# Patient Record
Sex: Female | Born: 1975 | Race: White | Hispanic: No | Marital: Married | Smoking: Never smoker
Health system: Southern US, Community
[De-identification: ages and names within clinical notes are randomized; demographics above are authoritative.]

## PROBLEM LIST (undated history)

## (undated) DIAGNOSIS — M1990 Unspecified osteoarthritis, unspecified site: Secondary | ICD-10-CM

## (undated) DIAGNOSIS — R238 Other skin changes: Secondary | ICD-10-CM

## (undated) DIAGNOSIS — G43909 Migraine, unspecified, not intractable, without status migrainosus: Secondary | ICD-10-CM

## (undated) DIAGNOSIS — R233 Spontaneous ecchymoses: Secondary | ICD-10-CM

## (undated) DIAGNOSIS — G44009 Cluster headache syndrome, unspecified, not intractable: Secondary | ICD-10-CM

## (undated) DIAGNOSIS — E785 Hyperlipidemia, unspecified: Secondary | ICD-10-CM

## (undated) DIAGNOSIS — M545 Low back pain, unspecified: Secondary | ICD-10-CM

## (undated) DIAGNOSIS — I10 Essential (primary) hypertension: Secondary | ICD-10-CM

## (undated) DIAGNOSIS — J029 Acute pharyngitis, unspecified: Secondary | ICD-10-CM

## (undated) DIAGNOSIS — K219 Gastro-esophageal reflux disease without esophagitis: Secondary | ICD-10-CM

## (undated) DIAGNOSIS — J349 Unspecified disorder of nose and nasal sinuses: Secondary | ICD-10-CM

## (undated) DIAGNOSIS — R569 Unspecified convulsions: Secondary | ICD-10-CM

## (undated) DIAGNOSIS — Z973 Presence of spectacles and contact lenses: Secondary | ICD-10-CM

## (undated) DIAGNOSIS — M722 Plantar fascial fibromatosis: Secondary | ICD-10-CM

## (undated) DIAGNOSIS — Z9884 Bariatric surgery status: Secondary | ICD-10-CM

## (undated) DIAGNOSIS — R32 Unspecified urinary incontinence: Secondary | ICD-10-CM

## (undated) HISTORY — DX: Migraine, unspecified, not intractable, without status migrainosus: G43.909

## (undated) HISTORY — DX: Gastro-esophageal reflux disease without esophagitis: K21.9

## (undated) HISTORY — DX: Presence of spectacles and contact lenses: Z97.3

## (undated) HISTORY — DX: Plantar fascial fibromatosis: M72.2

## (undated) HISTORY — DX: Unspecified convulsions: R56.9

## (undated) HISTORY — DX: Essential (primary) hypertension: I10

## (undated) HISTORY — DX: Low back pain, unspecified: M54.50

## (undated) HISTORY — DX: Other skin changes: R23.8

## (undated) HISTORY — DX: Unspecified osteoarthritis, unspecified site: M19.90

## (undated) HISTORY — DX: Spontaneous ecchymoses: R23.3

## (undated) HISTORY — DX: Unspecified urinary incontinence: R32

## (undated) HISTORY — DX: Unspecified disorder of nose and nasal sinuses: J34.9

## (undated) HISTORY — DX: Acute pharyngitis, unspecified: J02.9

## (undated) HISTORY — DX: Low back pain: M54.5

## (undated) HISTORY — DX: Cluster headache syndrome, unspecified, not intractable: G44.009

## (undated) HISTORY — DX: Hyperlipidemia, unspecified: E78.5

---

## 1997-08-29 HISTORY — PX: TUBAL LIGATION: SHX77

## 2001-07-04 ENCOUNTER — Other Ambulatory Visit: Admission: RE | Admit: 2001-07-04 | Discharge: 2001-07-04 | Payer: Self-pay | Admitting: Obstetrics and Gynecology

## 2002-07-31 ENCOUNTER — Other Ambulatory Visit: Admission: RE | Admit: 2002-07-31 | Discharge: 2002-07-31 | Payer: Self-pay | Admitting: Obstetrics and Gynecology

## 2003-03-28 ENCOUNTER — Encounter: Payer: Self-pay | Admitting: *Deleted

## 2003-03-28 ENCOUNTER — Encounter: Admission: RE | Admit: 2003-03-28 | Discharge: 2003-03-28 | Payer: Self-pay | Admitting: *Deleted

## 2003-04-15 ENCOUNTER — Encounter (INDEPENDENT_AMBULATORY_CARE_PROVIDER_SITE_OTHER): Payer: Self-pay | Admitting: Specialist

## 2003-04-15 ENCOUNTER — Ambulatory Visit (HOSPITAL_COMMUNITY): Admission: RE | Admit: 2003-04-15 | Discharge: 2003-04-15 | Payer: Self-pay | Admitting: *Deleted

## 2003-04-21 ENCOUNTER — Encounter: Payer: Self-pay | Admitting: *Deleted

## 2003-04-21 ENCOUNTER — Ambulatory Visit (HOSPITAL_COMMUNITY): Admission: RE | Admit: 2003-04-21 | Discharge: 2003-04-21 | Payer: Self-pay | Admitting: *Deleted

## 2003-10-10 ENCOUNTER — Other Ambulatory Visit: Admission: RE | Admit: 2003-10-10 | Discharge: 2003-10-10 | Payer: Self-pay | Admitting: Obstetrics and Gynecology

## 2004-01-02 ENCOUNTER — Ambulatory Visit (HOSPITAL_BASED_OUTPATIENT_CLINIC_OR_DEPARTMENT_OTHER): Admission: RE | Admit: 2004-01-02 | Discharge: 2004-01-02 | Payer: Self-pay | Admitting: Orthopedic Surgery

## 2004-01-02 HISTORY — PX: PLANTAR FASCIA RELEASE: SHX2239

## 2004-07-05 ENCOUNTER — Emergency Department (HOSPITAL_COMMUNITY): Admission: EM | Admit: 2004-07-05 | Discharge: 2004-07-05 | Payer: Self-pay | Admitting: Emergency Medicine

## 2004-08-06 ENCOUNTER — Encounter (INDEPENDENT_AMBULATORY_CARE_PROVIDER_SITE_OTHER): Payer: Self-pay | Admitting: Specialist

## 2004-08-06 ENCOUNTER — Inpatient Hospital Stay (HOSPITAL_COMMUNITY): Admission: EM | Admit: 2004-08-06 | Discharge: 2004-08-07 | Payer: Self-pay | Admitting: Emergency Medicine

## 2004-08-06 HISTORY — PX: CHOLECYSTECTOMY: SHX55

## 2004-09-23 ENCOUNTER — Emergency Department (HOSPITAL_COMMUNITY): Admission: EM | Admit: 2004-09-23 | Discharge: 2004-09-23 | Payer: Self-pay | Admitting: Emergency Medicine

## 2004-10-15 ENCOUNTER — Encounter (INDEPENDENT_AMBULATORY_CARE_PROVIDER_SITE_OTHER): Payer: Self-pay | Admitting: *Deleted

## 2004-10-15 ENCOUNTER — Ambulatory Visit (HOSPITAL_COMMUNITY): Admission: RE | Admit: 2004-10-15 | Discharge: 2004-10-15 | Payer: Self-pay | Admitting: Obstetrics and Gynecology

## 2004-10-15 HISTORY — PX: HYSTEROSCOPY WITH NOVASURE: SHX5574

## 2004-10-28 ENCOUNTER — Other Ambulatory Visit: Admission: RE | Admit: 2004-10-28 | Discharge: 2004-10-28 | Payer: Self-pay | Admitting: Obstetrics and Gynecology

## 2005-03-10 ENCOUNTER — Ambulatory Visit: Payer: Self-pay | Admitting: Critical Care Medicine

## 2005-03-23 ENCOUNTER — Emergency Department (HOSPITAL_COMMUNITY): Admission: EM | Admit: 2005-03-23 | Discharge: 2005-03-23 | Payer: Self-pay | Admitting: Emergency Medicine

## 2005-12-15 ENCOUNTER — Other Ambulatory Visit: Admission: RE | Admit: 2005-12-15 | Discharge: 2005-12-15 | Payer: Self-pay | Admitting: Obstetrics and Gynecology

## 2005-12-26 ENCOUNTER — Inpatient Hospital Stay (HOSPITAL_COMMUNITY): Admission: EM | Admit: 2005-12-26 | Discharge: 2005-12-27 | Payer: Self-pay | Admitting: Psychiatry

## 2005-12-27 ENCOUNTER — Ambulatory Visit: Payer: Self-pay | Admitting: Psychiatry

## 2007-10-20 ENCOUNTER — Emergency Department (HOSPITAL_COMMUNITY): Admission: EM | Admit: 2007-10-20 | Discharge: 2007-10-21 | Payer: Self-pay | Admitting: Emergency Medicine

## 2009-01-20 ENCOUNTER — Ambulatory Visit: Payer: Self-pay | Admitting: Diagnostic Radiology

## 2009-01-20 ENCOUNTER — Ambulatory Visit (HOSPITAL_BASED_OUTPATIENT_CLINIC_OR_DEPARTMENT_OTHER): Admission: RE | Admit: 2009-01-20 | Discharge: 2009-01-20 | Payer: Self-pay | Admitting: Obstetrics and Gynecology

## 2009-07-02 ENCOUNTER — Ambulatory Visit (HOSPITAL_COMMUNITY): Admission: RE | Admit: 2009-07-02 | Discharge: 2009-07-02 | Payer: Self-pay | Admitting: Orthopedic Surgery

## 2009-08-25 ENCOUNTER — Ambulatory Visit (HOSPITAL_BASED_OUTPATIENT_CLINIC_OR_DEPARTMENT_OTHER): Admission: RE | Admit: 2009-08-25 | Discharge: 2009-08-25 | Payer: Self-pay | Admitting: Orthopedic Surgery

## 2009-08-25 HISTORY — PX: PLANTAR FASCIA RELEASE: SHX2239

## 2010-06-15 ENCOUNTER — Encounter
Admission: RE | Admit: 2010-06-15 | Discharge: 2010-09-13 | Payer: Self-pay | Source: Home / Self Care | Attending: Family Medicine | Admitting: Family Medicine

## 2010-08-16 ENCOUNTER — Ambulatory Visit (HOSPITAL_COMMUNITY)
Admission: RE | Admit: 2010-08-16 | Discharge: 2010-08-16 | Payer: Self-pay | Source: Home / Self Care | Attending: Surgery | Admitting: Surgery

## 2010-08-20 ENCOUNTER — Ambulatory Visit (HOSPITAL_COMMUNITY)
Admission: RE | Admit: 2010-08-20 | Discharge: 2010-08-20 | Payer: Self-pay | Source: Home / Self Care | Attending: Surgery | Admitting: Surgery

## 2010-09-20 ENCOUNTER — Encounter
Admission: RE | Admit: 2010-09-20 | Discharge: 2010-09-28 | Payer: Self-pay | Source: Home / Self Care | Attending: Surgery | Admitting: Surgery

## 2010-11-18 ENCOUNTER — Ambulatory Visit: Payer: Self-pay

## 2010-11-29 LAB — POCT HEMOGLOBIN-HEMACUE: Hemoglobin: 11.7 g/dL — ABNORMAL LOW (ref 12.0–15.0)

## 2010-12-06 ENCOUNTER — Inpatient Hospital Stay (HOSPITAL_COMMUNITY): Admission: RE | Admit: 2010-12-06 | Payer: 59 | Source: Ambulatory Visit | Admitting: Surgery

## 2010-12-28 DIAGNOSIS — Z9884 Bariatric surgery status: Secondary | ICD-10-CM

## 2010-12-28 HISTORY — DX: Bariatric surgery status: Z98.84
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
## 2011-01-14 NOTE — Op Note (Signed)
NAMENIOKA, THORINGTON NO.:  [id number]   MEDICAL RECORD NO.:  [id number]          PATIENT TYPE:  INP   LOCATION:  0451                         FACILITY:  [facility]   PHYSICIAN:  Adolph Pollack, M.D.DATE OF BIRTH:  06-11-1976   DATE OF PROCEDURE:  08/06/2004  DATE OF DISCHARGE:                                 OPERATIVE REPORT   PREOPERATIVE DIAGNOSES:  Symptomatic cholelithiasis.   POSTOPERATIVE DIAGNOSES:  Symptomatic cholelithiasis with chronic  cholecystitis.   PROCEDURE:  Laparoscopic cholecystectomy with intraoperative cholangiogram.   SURGEON:  Adolph Pollack, M.D.   ASSISTANT:  Gabrielle Dare. Janee Morn, M.D.   ANESTHESIA:  General.   REASON FOR ADMISSION:  Ms. Ophelia Charter is a 35 year old female who had an attack  of biliary colic and was seen by Dr. Chevis Pretty in the office. At that time,  she was scheduled for elective cholecystectomy.  However, she began having  intractable right upper quadrant pain and presented to the emergency  department. She did not have elevation to deliver function tests at the time  but the pain was difficult to control.  Her white cell count was also  normal. Amylase was normal.  Urine pregnancy negative. 3-6 white blood cells  per high powered field. There were many bacteria noted. She started him on  IV Ancef.  She now presents for urgent cholecystectomy. The procedure and  the risks including but not limited to bleeding, infection, bile duct  injury, hepatic injury and bile leak, small intestine injury, risk of  anesthesia and post cholecystectomy diarrhea were explained to her  preoperatively.   TECHNIQUE:  She was seen in the holding area, brought to the operating room,  placed supine on the operating table and a general anesthetic was  administered. The abdominal wall was sterilely prepped and draped. Dilute  Marcaine solution was infiltrated in the subumbilical region and a previous  transverse subumbilical scar was  reincised through the skin and subcutaneous  tissue and down to the fascia.  The small incision was made in the midline  fascia and into the peritoneum and the peritoneal cavity was entered.  A  pursestring suture of #0 Vicryl was placed around the fascial edges. A  Hasson trocar was introduced into the peritoneal cavity and a  pneumoperitoneum was created by insufflation of CO2 gas.   Next, a laparoscope was introduced into the peritoneal cavity. The patient  was placed in reverse Trendelenburg position with the right side tilted  slightly upward.  An 11 mm trocar was placed through an epigastric incision  into the peritoneal cavity and two 5 mm trocars were placed in the right mid  abdomen into the peritoneal cavity.  The fundus of the gallbladder was  grasped.  It was pale looking consistent with chronic changes.  Some omental  adhesions and duodenal adhesions were lysed sharply. The fundus was then  able to be retracted toward the right shoulder and infundibulum was  mobilized.  I then isolated the cystic duct and created a window around it.  A clip is placed at the cystic duct gallbladder junction  and a small  incision made in the cystic duct and bile milked down from it.  A  cholangiocatheter was placed into the anterior abdominal wall and a  cholangiogram was performed.   Under real-time fluoroscopy, dilute contrast material was injected into the  cystic duct. The common hepatic, right and left hepatic and common bile  ducts all filled promptly and the common bile duct drained contrast promptly  into the duodenum. No obvious obstructing lesions were noted. Final report  pending the radiologist's interpretation.   The cholangiocatheter was removed, the cystic duct was clipped three times  proximally and divided. I identified the anterior and posterior branch of  the cystic artery, clipped them and divided them after creating windows  around them. The gallbladder is then dissected  free from the liver bed  intact using electrocautery. The gallbladder is then removed through the  subumbilical port.   The Hasson trocar was replaced in the subumbilical port and liver was  copiously irrigated in the perihepatic area. Bleeding points in the  gallbladder fossa were controlled with the cautery. The area was inspected  and no bleeding or bile leak was noted.  The majority of the fluid was  evacuated.   The Hasson trocar was removed and the subumbilical fascial defect was closed  under laparoscopic vision by tightening up and tying down the pursestring  suture.  The remaining trocars were removed and the pneumoperitoneum was  released. The skin incisions were closed with 4-0 Monocryl subcuticular  stitches followed by Steri-Strips and sterile dressings.   She tolerated the procedure well without any apparent complications and was  taken to the recovery room in satisfactory condition.     Tish Men  D:  08/06/2004  T:  08/07/2004  Job:  [id number]   cc:   Cheri Rous  [address]  [place]  [place] [address]  Fax: [phone]

## 2011-01-14 NOTE — Op Note (Signed)
[name], [name]                 ACCOUNT NO.:  [id number]   MEDICAL RECORD NO.:  [id number]          PATIENT TYPE:  AMB   LOCATION:  SDC                           [facility]ILITY[facility]  [facility]   PHYSICIAN:  Juluis Mire, M.D.   DATE OF BIRTH:  [birth year]   DATE OF PROCEDURE:  10/15/2004  DATE OF DISCHARGE:                                 OPERATIVE REPORT   PREOPERATIVE DIAGNOSIS:  Menorrhagia.   POSTOPERATIVE DIAGNOSIS:  Menorrhagia.   OPERATIVE PROCEDURES:  1.  Hysteroscopy.  2.  Endometrial ablation using the NovaSure.   SURGEON:  Juluis Mire, M.D.   ANESTHESIA:  General.   ESTIMATED BLOOD LOSS:  Minimal.   PACKS AND DRAINS:  None.   INTRAOPERATIVE BLOOD REPLACED:  None.   COMPLICATIONS:  None.   INDICATIONS:  Dictated in the history and physical.   The procedure was as follows:  The patient was taken to the OR and placed in  the supine position.  After a satisfactory level of general anesthesia  obtained, the patient was placed in the dorsal lithotomy position using the  Allen stirrups.  The perineum and vagina were cleansed out with Betadine and  the patient was draped as a sterile field.  A speculum was placed in the  vaginal vault.  The cervix was grasped with a single-tooth tenaculum.  Endocervical length sounded to 4.5 cm.  The uterine cavity was 9 cm.  Total  cavity depth therefore was 4.5 cm.  The cervix was serially dilated to a  size 27 Pratt dilator.  The nonoperative hysteroscope was introduced and the  intrauterine cavity was distended using lactated Ringer's.  Visualization  revealed a normal smooth endometrium.  There were no polyps or other  lesions.  The hysteroscope was removed.  Endometrial curettings were  obtained and sent for pathologic review.  The NovaSure was then  appropriately set to 4.5 cm.  It was inserted.  It was drawn back 1 cm.  It  was then extended into the cavity.  Through movement it was properly placed.  We had a cavity width of 4.8 cm.   The CO2 test then passed.  Ablation was  undertaken for 62 seconds to a power of 119.  The NovaSure was  then removed intact.  There was no active bleeding or signs of complication.  The single-tooth tenaculum and speculum were removed.  The patient was taken  out of the dorsal lithotomy position and once alert transferred to the  recovery room in good condition.  Sponge, instrument and needle count  reported as correct by circulating nurse x2.      JSM/MEDQ  D:  10/15/2004  T:  10/15/2004  Job:  [id number]
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
## 2011-01-14 NOTE — H&P (Signed)
NAMETOMMA, EHINGER NO.:  [id number]   MEDICAL RECORD NO.:  [id number]          PATIENT TYPE:  AMB   LOCATION:  SDC                           [facility]ILITY[facility]   PHYSICIAN:  Juluis Mire, M.D.   DATE OF BIRTH:  [birth year]   DATE OF ADMISSION:  10/15/2004  DATE OF DISCHARGE:                                HISTORY & PHYSICAL   HISTORY OF PRESENT ILLNESS:  The patient is a 35 year old gravida 2, para 2,  married female who presents for hysteroscopy and Novasure ablation.   In relation to the present admission, the patient has had a previous tubal  ligation.  Her cycles remain regular.  She describes heavy flow with her  cycles.  She has four to five days of flow, changing pads frequently.  Her  hemoglobin has been slightly depressed at 11.  She underwent a saline  infusion ultrasound, with a normal endometrial cavity; no polyps or abnormal  thicknesses were noted.  In view of the continued menorrhagia, the decision  was to proceed with a hysteroscopy and Novasure ablation.  Our presumptive  diagnosis is adenomyosis.   ALLERGIES:  No known drug allergies.   MEDICATIONS:  Effexor.   PAST MEDICAL HISTORY:  Usual childhood diseases.  No significant sequelae.   PAST SURGICAL HISTORY:  She had a laparoscopic bilateral tubal ligation.   PAST OBSTETRICAL HISTORY:  Two vaginal deliveries.   SOCIAL HISTORY:  No tobacco or alcohol use.   FAMILY HISTORY:  Noncontributory.   REVIEW OF SYSTEMS:  Noncontributory.   PHYSICAL EXAMINATION:  VITAL SIGNS:  The patient is afebrile with stable  vital signs.  HEENT:  The patient is normocephalic.  Pupils are equal, round and reactive  to light and accommodation.  Extraocular movements are intact.  Sclerae and  conjunctivae clear.  Oropharynx clear.  NECK:  Without thyromegaly.  BREASTS:  No discrete masses.  LUNGS:  Clear.  CARDIOVASCULAR:  Regular rhythm without murmurs or gallops.  ABDOMEN:  Her abdominal exam is benign.   No masses, organomegaly or  tenderness.  PELVIC:  Normal external genitalia.  Vaginal mucosa is clear.  Cervix is  unremarkable.  Uterus is upper limits of normal size, boggy, possible  adenomyosis.  Adnexa unremarkable.  EXTREMITIES:  Trace edema.  NEUROLOGIC:  Grossly within normal limits.   IMPRESSION:  Adenomyosis leading to menorrhagia.   PLAN:  The patient will undergo a hysteroscopy and Novasure ablation.  Success rate of 89% has been quoted.  The risks of surgery have been  discussed including the risks of infection, the risks of hemorrhage which  could require transfusion and the risks of AIDS or hepatitis.  Risks of  perforation that could lead to injury to adjacent organs requiring  exploratory surgery and possible hysterectomy as well as the risks of deep  venous thrombosis and pulmonary embolus.  Rare risks would include fluid  intoxication leading to hyponatremia or pulmonary edema.  The patient  expresses understanding of indications and risks and alternatives.      JSM/MEDQ  D:  10/15/2004  T:  10/15/2004  Job:  [id number]

## 2011-01-14 NOTE — Op Note (Signed)
   NAME:  [name], [name]                           ACCOUNT NO.:  [id number]   MEDICAL RECORD NO.:  [id number]                   PATIENT TYPE:  AMB   LOCATION:  ENDO                                 FACILITY:  [facility]   PHYSICIAN:  Georgiana Spinner, M.D.                 DATE OF BIRTH:  June 14, 1976   DATE OF PROCEDURE:  DATE OF DISCHARGE:                                 OPERATIVE REPORT   PROCEDURE:  Upper endoscopy with biopsy.   INDICATIONS FOR PROCEDURE:  Abdominal pain, vomiting.   ANESTHESIA:  Demerol 70, Versed 8 mg.   DESCRIPTION OF PROCEDURE:  With the patient mildly sedated in the left  lateral decubitus position, the Olympus videoscopic endoscope was inserted  in the mouth and passed under direct vision through the esophagus which  appeared normal. A question of Barrett's was seen, photographed and  biopsied. We entered into the stomach. The fundus, body, antrum, duodenal  bulb and second portion of the duodenum all appeared normal. From this  point, the endoscope was slowly withdrawn taking circumferential views of  the duodenal mucosa until the endoscope was then pulled back in the stomach,  placed in retroflexion to view the stomach from below and a very lax  sphincter was seen and photographed.  The endoscope was then straightened  and withdrawn taking circumferential views of the remaining gastric and  esophageal mucosa. The patient's vital signs and pulse oximeter remained  stable. The patient tolerated the procedure well without apparent  complications.   FINDINGS:  Lax sphincter at the GE junction with possible Barrett's versus  normal variant at the gastroesophageal junction.   PLAN:  Avoid biopsy report. Will have the patient call me for results and  followup with me as an outpatient. Will obtain gastric emptying study.                                               Georgiana Spinner, M.D.    GMO/MEDQ  D:  04/15/2003  T:  04/15/2003  Job:  [id number]   cc:   Cheri Rous  [address]  [place]  [place] [address]  Fax: [phone]

## 2011-01-14 NOTE — Op Note (Signed)
NAME:  [name], [name]                           ACCOUNT NO.:  [id number]   MEDICAL RECORD NO.:  [id number]                   PATIENT TYPE:  AMB   LOCATION:  [facility]                                 FACILITY:  [facility]   PHYSICIAN:  Marlowe Kays, M.D.               DATE OF BIRTH:  1976/07/14   DATE OF PROCEDURE:  01/02/2004  DATE OF DISCHARGE:                                 OPERATIVE REPORT   PREOPERATIVE DIAGNOSIS:  Chronic plantar fasciitis, right heel.   POSTOPERATIVE DIAGNOSIS:  Chronic plantar fasciitis, right heel.   OPERATION:  Release plantar fascia, right heel   SURGEON:  Dr. Simonne Come   ASSISTANT:  Nurse   ANESTHESIA:  General.   PATHOLOGY AND JUSTIFICATION FOR PROCEDURE:  She has had chronic pain from  the medial origin of the plantar fascia, right heel for approximately 8  months with no relief with nonsurgical treatment.  X-rays demonstrate a mild  to moderately large heel spur.  She is here at this time for release of  plantar fascia and the excision of the heel spur if it appears to be playing  a part in her pathology.  Preoperative discussion was made in detail with  her.   DESCRIPTION OF PROCEDURE:  Satisfactory general anesthesia, pneumatic  tourniquet, leg was esmarched out nonsteriley and the right foot and ankle  prepped with DuraPrep and draped in a sterile field.  I made a 2 cm incision  over the posterior longitudinal arch overlying the __________.  The plantar  fascia was identified and separated out from the muscle overlying it and  inner lying subcutaneous tissue and released across the width of the heel  with the major part of this under direct visualization with scissors, also  using a freer elevator.  At the conclusion of the release, I was able to  admit my little finger across the entire width of the heel with no residual  bands present.  Also of significance was that there was no real palpable  heel spur which might be creating a pathologic problem,  so I felt that no  excision was indicated.  The wound was then irrigated was sterile saline and  infiltrated with 0.5% plain Marcaine.  Closure was performed with  interrupted 3-0 Vicryl in subcutaneous tissue, 4-0 nylon on the skin.  Betadine, Adaptic dry sterile dressing were applied.  Tourniquet was  released.  She tolerated the procedure well and was taken to the recovery  room in satisfactory condition with no known complications.                                               Marlowe Kays, M.D.    JA/MEDQ  D:  01/02/2004  T:  01/02/2004  Job:  [id number]

## 2011-02-01 ENCOUNTER — Encounter (HOSPITAL_BASED_OUTPATIENT_CLINIC_OR_DEPARTMENT_OTHER)
Admission: RE | Admit: 2011-02-01 | Discharge: 2011-02-01 | Disposition: A | Discharge status: Home / Self Care | Payer: Worker's Compensation | Source: Ambulatory Visit | Attending: Orthopedic Surgery | Admitting: Orthopedic Surgery

## 2011-02-01 LAB — BASIC METABOLIC PANEL
BUN: 15 mg/dL (ref 6–23)
CO2: 26 mEq/L (ref 19–32)
Calcium: 9.6 mg/dL (ref 8.4–10.5)
Chloride: 104 mEq/L (ref 96–112)
Creatinine, Ser: 0.89 mg/dL (ref 0.4–1.2)
GFR calc Af Amer: >60 mL/min (ref >60)
GFR calc non Af Amer: >60 mL/min (ref >60)
Glucose, Bld: 116 mg/dL — ABNORMAL HIGH (ref 70–99)
Potassium: 5.4 mEq/L — ABNORMAL HIGH (ref 3.5–5.1)
Sodium: 138 mEq/L (ref 135–145)

## 2011-02-02 ENCOUNTER — Ambulatory Visit (HOSPITAL_BASED_OUTPATIENT_CLINIC_OR_DEPARTMENT_OTHER)
Admission: RE | Admit: 2011-02-02 | Discharge: 2011-02-02 | Disposition: A | Discharge status: Home / Self Care | Payer: Worker's Compensation | Source: Ambulatory Visit | Attending: Orthopedic Surgery | Admitting: Orthopedic Surgery

## 2011-02-02 DIAGNOSIS — Z01812 Encounter for preprocedural laboratory examination: Secondary | ICD-10-CM | POA: Insufficient documentation

## 2011-02-02 DIAGNOSIS — G5600 Carpal tunnel syndrome, unspecified upper limb: Secondary | ICD-10-CM | POA: Insufficient documentation

## 2011-02-02 HISTORY — PX: CARPAL TUNNEL RELEASE: SHX101

## 2011-02-02 LAB — POCT HEMOGLOBIN-HEMACUE: Hemoglobin: 12.3 g/dL (ref 12.0–15.0)

## 2011-02-24 ENCOUNTER — Encounter: Payer: 59 | Attending: Surgery

## 2011-02-24 DIAGNOSIS — Z713 Dietary counseling and surveillance: Secondary | ICD-10-CM | POA: Insufficient documentation

## 2011-02-24 DIAGNOSIS — Z01818 Encounter for other preprocedural examination: Secondary | ICD-10-CM | POA: Insufficient documentation

## 2011-03-07 ENCOUNTER — Other Ambulatory Visit (INDEPENDENT_AMBULATORY_CARE_PROVIDER_SITE_OTHER): Payer: Self-pay | Admitting: Surgery

## 2011-03-07 ENCOUNTER — Encounter (HOSPITAL_COMMUNITY): Payer: 59

## 2011-03-07 LAB — DIFFERENTIAL
Basophils Absolute: 0 10*3/uL (ref 0.0–0.1)
Basophils Relative: 1 % (ref 0–1)
Eosinophils Absolute: 0.1 10*3/uL (ref 0.0–0.7)
Eosinophils Relative: 1 % (ref 0–5)
Lymphocytes Relative: 26 % (ref 12–46)
Lymphs Abs: 1.7 10*3/uL (ref 0.7–4.0)
Monocytes Absolute: 0.5 10*3/uL (ref 0.1–1.0)
Monocytes Relative: 8 % (ref 3–12)
Neutro Abs: 4.2 10*3/uL (ref 1.7–7.7)
Neutrophils Relative %: 64 % (ref 43–77)

## 2011-03-07 LAB — CBC
HCT: 37 % (ref 36.0–46.0)
Hemoglobin: 12.2 g/dL (ref 12.0–15.0)
MCH: 30 pg (ref 26.0–34.0)
MCHC: 33 g/dL (ref 30.0–36.0)
MCV: 90.9 fL (ref 78.0–100.0)
Platelets: 218 10*3/uL (ref 150–400)
RBC: 4.07 MIL/uL (ref 3.87–5.11)
RDW: 13.9 % (ref 11.5–15.5)
WBC: 6.5 10*3/uL (ref 4.0–10.5)

## 2011-03-07 LAB — COMPREHENSIVE METABOLIC PANEL
ALT: 30 U/L (ref 0–35)
AST: 23 U/L (ref 0–37)
Albumin: 3.8 g/dL (ref 3.5–5.2)
Alkaline Phosphatase: 75 U/L (ref 39–117)
BUN: 12 mg/dL (ref 6–23)
CO2: 27 mEq/L (ref 19–32)
Calcium: 9.4 mg/dL (ref 8.4–10.5)
Chloride: 102 mEq/L (ref 96–112)
Creatinine, Ser: 0.93 mg/dL (ref 0.50–1.10)
GFR calc Af Amer: >60 mL/min (ref >60)
GFR calc non Af Amer: >60 mL/min (ref >60)
Glucose, Bld: 104 mg/dL — ABNORMAL HIGH (ref 70–99)
Potassium: 4.1 mEq/L (ref 3.5–5.1)
Sodium: 138 mEq/L (ref 135–145)
Total Bilirubin: 0.2 mg/dL — ABNORMAL LOW (ref 0.3–1.2)
Total Protein: 6.9 g/dL (ref 6.0–8.3)

## 2011-03-07 LAB — SURGICAL PCR SCREEN
MRSA, PCR: NEGATIVE
Staphylococcus aureus: NEGATIVE

## 2011-03-07 LAB — HCG, SERUM, QUALITATIVE: Preg, Serum: NEGATIVE

## 2011-03-10 ENCOUNTER — Encounter (INDEPENDENT_AMBULATORY_CARE_PROVIDER_SITE_OTHER): Payer: Self-pay | Admitting: General Surgery

## 2011-03-10 ENCOUNTER — Encounter (INDEPENDENT_AMBULATORY_CARE_PROVIDER_SITE_OTHER): Payer: Self-pay | Admitting: Surgery

## 2011-03-10 ENCOUNTER — Ambulatory Visit (INDEPENDENT_AMBULATORY_CARE_PROVIDER_SITE_OTHER): Payer: 59 | Admitting: Surgery

## 2011-03-10 VITALS — BP 112/72 | HR 76 | Temp 97.8°F | Resp 16 | Ht 64.0 in | Wt 234.1 lb

## 2011-03-10 DIAGNOSIS — E669 Obesity, unspecified: Secondary | ICD-10-CM

## 2011-03-10 NOTE — Patient Instructions (Signed)
Continue preop diet Take bowel prep on Sunday before your surgery on Monday

## 2011-03-10 NOTE — Progress Notes (Signed)
Subjective:     Patient ID: [name], female   DOB: 01/01/1976, 35 y.o.   MRN: [id number]    BP 112/72  Pulse 76  Temp(Src) 97.8 F (36.6 C) (Temporal)  Resp 16  Ht 5\' 4"  (1.626 m)  Wt 234 lb 2 oz (106.198 kg)  BMI 40.19 kg/m2    HPIThis is a preop visit for [name]. Her family doctor is Cheri Rous. He is with [facility].  She is ready for to proceed with laparoscopic Roux-en-Y gastric bypass. She has no further questions. Today his BMI is 39.9 the weight is 234.6. She is 5 feet 4 inches tall. Her upper GI showed mild GE reflux and no hiatal hernia. Laboratory results were negative for H. pylori   Review of Systems     Objective:   Physical Exam     Assessment:         Plan:

## 2011-03-14 ENCOUNTER — Inpatient Hospital Stay (HOSPITAL_COMMUNITY)
Admission: RE | Admit: 2011-03-14 | Discharge: 2011-03-16 | DRG: 621 | Disposition: A | Discharge status: Home / Self Care | Payer: 59 | Source: Ambulatory Visit | Attending: Surgery | Admitting: Surgery

## 2011-03-14 DIAGNOSIS — Z6841 Body Mass Index (BMI) 40.0 and over, adult: Secondary | ICD-10-CM

## 2011-03-14 DIAGNOSIS — I10 Essential (primary) hypertension: Secondary | ICD-10-CM | POA: Diagnosis present

## 2011-03-14 DIAGNOSIS — F319 Bipolar disorder, unspecified: Secondary | ICD-10-CM | POA: Diagnosis present

## 2011-03-14 DIAGNOSIS — Z01812 Encounter for preprocedural laboratory examination: Secondary | ICD-10-CM

## 2011-03-14 DIAGNOSIS — E785 Hyperlipidemia, unspecified: Secondary | ICD-10-CM | POA: Diagnosis present

## 2011-03-14 HISTORY — PX: ESOPHAGOGASTRODUODENOSCOPY: SHX1529

## 2011-03-14 HISTORY — PX: GASTRIC BYPASS: SHX52

## 2011-03-14 LAB — HEMOGLOBIN AND HEMATOCRIT, BLOOD: Hemoglobin: 13.4 g/dL (ref 12.0–15.0)

## 2011-03-15 ENCOUNTER — Inpatient Hospital Stay (HOSPITAL_COMMUNITY): Payer: 59

## 2011-03-15 DIAGNOSIS — Z09 Encounter for follow-up examination after completed treatment for conditions other than malignant neoplasm: Secondary | ICD-10-CM

## 2011-03-15 LAB — DIFFERENTIAL
Basophils Absolute: 0 10*3/uL (ref 0.0–0.1)
Basophils Relative: 0 % (ref 0–1)
Eosinophils Absolute: 0 10*3/uL (ref 0.0–0.7)
Monocytes Absolute: 1 10*3/uL (ref 0.1–1.0)
Neutro Abs: 7.5 10*3/uL (ref 1.7–7.7)
Neutrophils Relative %: 76 % (ref 43–77)

## 2011-03-15 LAB — CBC
HCT: 34.7 % — ABNORMAL LOW (ref 36.0–46.0)
Hemoglobin: 11.8 g/dL — ABNORMAL LOW (ref 12.0–15.0)
MCH: 30.7 pg (ref 26.0–34.0)
MCV: 90.4 fL (ref 78.0–100.0)
RBC: 3.84 MIL/uL — ABNORMAL LOW (ref 3.87–5.11)
WBC: 9.9 10*3/uL (ref 4.0–10.5)

## 2011-03-15 MED ORDER — IOHEXOL 300 MG/ML  SOLN
50.0000 mL | Freq: Once | INTRAMUSCULAR | Status: AC | PRN
  Administered 2011-03-15: 50 mL via ORAL

## 2011-03-16 LAB — DIFFERENTIAL
Lymphocytes Relative: 26 % (ref 12–46)
Monocytes Absolute: 0.6 10*3/uL (ref 0.1–1.0)
Monocytes Relative: 11 % (ref 3–12)
Neutro Abs: 3.2 10*3/uL (ref 1.7–7.7)
Neutrophils Relative %: 62 % (ref 43–77)

## 2011-03-16 LAB — CBC
HCT: 34.3 % — ABNORMAL LOW (ref 36.0–46.0)
Hemoglobin: 11.1 g/dL — ABNORMAL LOW (ref 12.0–15.0)
MCH: 29.8 pg (ref 26.0–34.0)
MCHC: 32.4 g/dL (ref 30.0–36.0)
RBC: 3.72 MIL/uL — ABNORMAL LOW (ref 3.87–5.11)

## 2011-03-17 NOTE — Op Note (Signed)
  NAMECHIANTE, PEDEN NO.:  [id number]  MEDICAL RECORD NO.:  [id number]  LOCATION:  0002                         FACILITY:  [facility]  PHYSICIAN:  Mary Sella. Andrey Campanile, MD     DATE OF BIRTH:  1976/01/29  DATE OF PROCEDURE: DATE OF DISCHARGE:                              OPERATIVE REPORT   PREOPERATIVE DIAGNOSES: 1. Morbid obesity (body mass index 40). 2. Hypertension. 3. Hyperlipidemia.  POSTOPERATIVE DIAGNOSES: 1. Morbid obesity (body mass index 40). 2. Hypertension. 3. Hyperlipidemia.  PROCEDURE:  Esophagogastroduodenoscopy.  SURGEON:  Mary Sella. Andrey Campanile, MD  INDICATIONS FOR PROCEDURE:  The patient is a morbidly obese Caucasian female who is undergoing a laparoscopic Roux-en-Y gastric bypass by Dr. Luretha Murphy.  Her comorbidities include hypertension and hyperlipidemia.  At the conclusion of the gastric gastrojejunostomy, I scrubbed out to perform the EGD to evaluate the pouch as well as help rule out leak at the anastomosis.  DESCRIPTION OF PROCEDURE:  After the gastrojejunostomy was complete, I scrubbed out, obtained the Olympus endoscope and gently placed in the patient's oropharynx and glided down her esophagus under direct visualization.  The GE junction was identified and the scope was advanced into the gastric pouch.  I gently insufflated the pouch.  The length of the pouch was approximately 4 to 4.5 cm.  The staple line was intact.  The anastomosis was widely patent.  There was no bleeding in the anastomosis.  I was able to intubate the Roux limb with the scope. There were no signs of air leak during the endoscopy.  The bowel was desufflated and the scope was withdrawn without any complications.  The patient tolerated the procedure well.  Please see Dr. Ermalene Searing operative note for further details regarding laparoscopic Roux-en-Y gastric bypass.     Mary Sella. Andrey Campanile, MD     EMW/MEDQ  D:  03/14/2011  T:  03/14/2011  Job:   [id number]  Electronically Signed by Gaynelle Adu M.D. on 03/16/2011 11:55:21 PM

## 2011-03-17 NOTE — Discharge Summary (Signed)
  NAMEELMER, BOUTELLE NO.:  [id number]  MEDICAL RECORD NO.:  [id number]  LOCATION:  1523                         FACILITY:  [facility]  PHYSICIAN:  Thornton Park. Daphine Deutscher, MD  DATE OF BIRTH:  10/17/1975  DATE OF ADMISSION:  03/14/2011 DATE OF DISCHARGE:  03/16/2011                              DISCHARGE SUMMARY   ADMITTING DIAGNOSIS:  Morbid obesity.  PROCEDURE:  Laparoscopic Roux-en-Y gastric bypass.  COURSE IN HOSPITAL:  This 35 year old white female underwent lap Roux-en- Y gastric bypass on March 14, 2011.  She did well.  On postop day #1, she had a swallow which showed good filling of small pouch and flow into her Roux limb.  She tolerated liquids and was ready for discharge on postop day #2.  She was given prescription for Roxicet elixir to take for pain and will be followed up in the office in 2 to 3 weeks.  CONDITION ON DISCHARGE:  Good.     Thornton Park Daphine Deutscher, MD     MBM/MEDQ  D:  03/16/2011  T:  03/16/2011  Job:  [id number]  Electronically Signed by Luretha Murphy MD on 03/17/2011 08:46:10 AM

## 2011-03-17 NOTE — Op Note (Signed)
NAMECANAAN, PRUE NO.:  [id number]  MEDICAL RECORD NO.:  [id number]  LOCATION:  0002                         FACILITY:  [facility]  PHYSICIAN:  Thornton Park. Daphine Deutscher, MD  DATE OF BIRTH:  1976/06/24  DATE OF PROCEDURE: DATE OF DISCHARGE:                              OPERATIVE REPORT   PREOPERATIVE DIAGNOSIS:  Morbid obesity with multiple comorbidities and body mass index of 40.  POSTOPERATIVE DIAGNOSIS:  Morbid obesity with multiple comorbidities and body mass index of 40.  PROCEDURE:  Laparoscopic Roux-en-Y gastric bypass with a 40 cm BP limb, 100 cm Roux limb, antecolic, antegastric, candy cane to the left, gastrojejunostomy, closure of Petersen's defect, and repair of the hiatal hernia.  SURGEON:  Thornton Park. Daphine Deutscher, MD  ASSISTANT:  Mary Sella. Andrey Campanile, MD  DESCRIPTION OF OPERATION:  This 35 year old white female was taken to room 1 and given general anesthesia.  After anesthesia, the abdomen was prepped with PCMX and draped sterilely.  After appropriate time-out, I entered the abdomen through the left upper quadrant using a 0 degree Optiview without difficulty.  The abdomen was insufflated.  Standard trocar placements were used.  She had minimal adhesions from her previous surgery.  I went and proximally identified the ligament of Treitz.  I measured 40 cm, divided the small bowel with a single application of white load TA-60.  A latex tubing was sewn to the distal Roux limb.  I then measured 100 cm and then created a side-to-side anastomosis with a 4.5 mm white load Endo-GIA.  This was done by aligning the antimesenteric bowel lumen, going in with a Harmonic and then inserting the staples and firing it.  Common defect was closed from either end with a running 2-0 Vicryl.  There was one little spot down on the crotch that I oversewed with a separate one.  Nice closure was obtained.  Tisseel was applied.  The common defect in the mesentery was closed with  running 2-0 silk.  Next, I divided the omentum.  Nathanson retractor was inserted up proximally.  She had a prominent dimple demonstrating a hiatal hernia.  I dissected this out, pulled the stomach down, brought this hernia out and then closed the hiatus anteriorly with a single suture of Surgidac intracorporeal tie.  I then measured 5 cm along the lesser curve.  I entered into the lesser sac along lesser curve and came across the stomach with the Endo-GIA blue load.  A second blue load was used and then 2  Duets were used to complete the creation of the pouch.  It was a nice small pouch. Bleeding was controlled.  Roux limb was then brought up and sewn on back wall with running 2-0 Vicryl.  The Penrose drain was removed.  Opening was made in both the Roux limb and in the stomach.  Through this, a 4.5 mm blue load was inserted and fired.  Common defect was closed from either end with 2-0 Vicryl and then second layer was applied after the Ewald tube was passed across the anastomosis.  I then clamped off the outflow, submerged it and Dr. Andrey Campanile endoscoped the patient.  This revealed about a 4 to 5 cm  long pouch.  We inflated it.  No bubbles were seen.  No bleeding was noted.  Everything looked good.  We then deflated.  Tisseel was applied to the anastomosis.  The wounds were injected with Exparel.  Wounds were closed with 4-0 Vicryl, Benzoin and Steri-Strips.  The patient tolerated the procedure well. She was taken to recovery room in satisfactory condition.     Thornton Park Daphine Deutscher, MD     MBM/MEDQ  D:  03/14/2011  T:  03/14/2011  Job:  [id number]  cc:   Cheri Rous, MD Fax: [phone]  Arva Chafe Fax: [phone]  Electronically Signed by Luretha Murphy MD on 03/17/2011 08:46:07 AM

## 2011-03-17 NOTE — H&P (Signed)
  NAMEERRICKA, [name] NO.:  [id number]  MEDICAL RECORD NO.:  [id number]  LOCATION:  0002                         FACILITY:  [facility]  PHYSICIAN:  Thornton Park. Daphine Deutscher, MD  DATE OF BIRTH:  26-Feb-1976  DATE OF ADMISSION:  03/14/2011 DATE OF DISCHARGE:                             HISTORY & PHYSICAL   PREOPERATIVE INDICATIONS:  Morbid obesity, BMI of 40.  HISTORY:  This 35 year old white female was initially seen by me back in December of 2011.  December BMI was 44 and she came with history of pretty much lifelong obesity, particularly in her adult years.  She has had comorbid conditions including hypertension, hyperlipidemia, arthritic foot pain, lumbago.  She has been on supervised weight loss with physician and nutritionist.  Her physician is Dr. Silvano Rusk.  Informed consent was obtained regarding laparoscopic Roux-en-Y gastric bypass and following workup, she is scheduled at this time for the procedure.  PAST MEDICAL HISTORY:  The patient has no known allergies.  She is not allergic to latex.  MEDICATIONS: 1. Naproxen p.r.n. 2. Adderall 20 mg b.i.d. 3. Bystolic 5 mg h.s. 4. Flonase nasal spray q.a.m. 5. Simcor 500/41 a day. 6. Pataday drops, both eyes, as needed. 7. Patanase nasally as needed. 8. Maxalt 10 mg one p.o. daily as needed. 9. Tramadol 50 mg every 6 hours as needed. 10.Soma 1 tablet b.i.d. 11.Oxycodone as needed. 12.Metoclopramide 10 mg 1 tablet b.i.d. 13.Ambien h.s. 14.Omeprazole 40 mg b.i.d.  SOCIAL HISTORY:  The patient is married with 2 children.  Does not drink.  Does not smoke.  REVIEW OF SYSTEMS:  15-point review of systems is positive for weight change, rash, easy bruisability, high cholesterol, high blood pressure, headaches, incontinence, nausea, vomiting, diarrhea, reflux problems, glasses required for reading.  PHYSICAL EXAMINATION:  VITAL SIGNS:  BMI is 40, blood pressures regulated normal 112/72, heart rate 88, height  is 5 feet 4 inches. HEENT:  Normocephalic.  Eyes, sclerae nonicteric.  Pupils are equal, round and reactive to light.  Nose and throat exam unremarkable. NECK:  Supple without masses. CHEST:  Clear to auscultation. HEART:  Sinus rhythm without murmurs or gallops. ABDOMEN:  Obese.  She has had a previous lap chole and there are some small incisions related to that. EXTREMITIES:  Full range of motion.  No history of DVT. NEURO:  Alert and oriented x3.  Motor and sensory function grossly intact. PSYCH:  The patient had appropriate affect and good fund of knowledge.  IMPRESSION:  Morbid obesity, body mass index of 40.  PLAN:  Laparoscopic Roux-en-Y gastric bypass.     Thornton Park Daphine Deutscher, MD     MBM/MEDQ  D:  03/14/2011  T:  03/14/2011  Job:  [id number]  Electronically Signed by Luretha Murphy MD on 03/17/2011 08:46:05 AM

## 2011-03-18 NOTE — Op Note (Addendum)
  [name], [name] NO.:  [id number]  MEDICAL RECORD NO.:  [id number]  LOCATION:  1S                           FACILITY:  [facility]  PHYSICIAN:  Cindee Salt, M.D.       DATE OF BIRTH:  February 07, 1976  DATE OF PROCEDURE:  02/02/2011 DATE OF DISCHARGE:                              OPERATIVE REPORT   PREOPERATIVE DIAGNOSIS:  Carpal tunnel syndrome, right hand.  POSTOPERATIVE DIAGNOSIS:  Carpal tunnel syndrome, right hand.  OPERATION:  Decompression of right median nerve.  SURGEON:  Cindee Salt, MD  ASSISTANT:  None.  ANESTHESIA:  Forearm-based IV regional with local infiltration.  ANESTHESIOLOGIST:  Janetta Hora. Gelene Mink, MD  HISTORY:  The patient is a 35 year old female with history of carpal tunnel syndrome.  EMG nerve conduction is positive, which has not responded to conservative treatment.  She is aware of risks and complications including infection, recurrence of injury to arteries, nerves, tendons, incomplete relief of symptoms, and dystrophy.  Pre, peri, postoperative course have all been discussed.  In the preoperative area, the patient is seen.  The extremity marked by both the patient and surgeon and antibiotic given.  PROCEDURE:  The patient was brought to the operating room where a forearm-based IV regional anesthetic was carried out without difficulty. She was prepped using ChloraPrep, supine position, right arm free.  A 3- minute dry time was allowed.  Time-out taken confirming the patient and procedure.  A longitudinal incision was made in the palm and carried down through the subcutaneous tissue.  Bleeders were electrocauterized. Palmar fascia was split, superficial palmar arch identified, the flexor tendon of the ring and little finger identified to the ulnar side of the median nerve.  Carpal retinaculum was incised with sharp suction.  Right angle and Sewall retractor were placed between the skin and forearm fascia.  The fascia was released  for approximately 1.5 cm proximal to the wrist crease under direct vision.  Canal was explored.  No further lesions were identified.  The wound was irrigated.  The skin was closed with interrupted 4-0 Vicryl Rapide sutures.  The infiltration of 0.25% Marcaine was given at the beginning of the procedure due to some feeling, 5 mL was used.  On deflation of the tourniquet, all fingers were immediately pinked.  She was taken to the recovery room for observation in satisfactory condition.  She will be discharged home to return to the [facility] [place] in 1 week, on Vicodin.  She will be place at one-handed work for 6 weeks.  She will be out of work until February 07, 2011.          ______________________________ Cindee Salt, M.D.     GK/MEDQ  D:  02/02/2011  T:  02/03/2011  Job:  [id number]  Electronically Signed by Cindee Salt M.D. on 03/18/2011 09:16:41 AM

## 2011-03-28 ENCOUNTER — Ambulatory Visit: Payer: Self-pay | Admitting: *Deleted

## 2011-03-29 ENCOUNTER — Encounter (INDEPENDENT_AMBULATORY_CARE_PROVIDER_SITE_OTHER): Payer: Self-pay | Admitting: General Surgery

## 2011-03-29 ENCOUNTER — Ambulatory Visit: Payer: Self-pay

## 2011-03-31 ENCOUNTER — Ambulatory Visit (INDEPENDENT_AMBULATORY_CARE_PROVIDER_SITE_OTHER): Payer: 59 | Admitting: Surgery

## 2011-03-31 ENCOUNTER — Encounter (INDEPENDENT_AMBULATORY_CARE_PROVIDER_SITE_OTHER): Payer: Self-pay | Admitting: Surgery

## 2011-03-31 VITALS — BP 122/72 | HR 88 | Temp 98.3°F | Resp 16 | Ht 64.0 in | Wt 218.5 lb

## 2011-03-31 DIAGNOSIS — Z9884 Bariatric surgery status: Secondary | ICD-10-CM

## 2011-03-31 NOTE — Progress Notes (Signed)
[name] returns today after her laparoscopic Roux-en-Y gastric bypass done on July 16. She still has some delayed pouch emptying and will occasionally get a full feeling in her chest. She is however able to take her protein shakes. She has an appointment with Royal Hawthorn next week.  Her weight today is 218.8 so she has lost 15 pounds. She is not complaining of much except for some nausea and she rates a 2 and diarrhea and she went to. When she gets out and he she does feel somewhat weak. I have encouraged her to get out and walk in the morning. She will see Amy next week and we'll hopefully be able to advance her diet. Plan return in 6 weeks

## 2011-03-31 NOTE — Patient Instructions (Signed)
Try to walk every day See Royal Hawthorn next week Folllowup with me in 6 weeks

## 2011-04-04 ENCOUNTER — Encounter: Payer: Self-pay | Admitting: *Deleted

## 2011-04-04 ENCOUNTER — Encounter: Payer: Managed Care, Other (non HMO) | Attending: Surgery | Admitting: *Deleted

## 2011-04-04 DIAGNOSIS — Z01818 Encounter for other preprocedural examination: Secondary | ICD-10-CM | POA: Insufficient documentation

## 2011-04-04 DIAGNOSIS — Z9884 Bariatric surgery status: Secondary | ICD-10-CM | POA: Insufficient documentation

## 2011-04-04 DIAGNOSIS — Z713 Dietary counseling and surveillance: Secondary | ICD-10-CM | POA: Insufficient documentation

## 2011-04-04 NOTE — Progress Notes (Signed)
  Follow-up visit: 3 Weeks Post-Operative Gastric Bypass Surgery  Medical Nutrition Therapy:  Appt start time: 1415 end time:  1445.  Assessment:  Primary concerns today: post-operative bariatric surgery nutrition management. Neha reports that she is eating some solid food but reports that "food and fluid are hanging and making me feel overly full". Protein and fluid intake are not to goal. Pt is not back to work yet but plans to go back 8/20 where she will have access to a treadmill for increasing exercise levels.  Weight today: 217.3 lbs Weight change: 22.1 lbs Total weight lost: 29.3 lbs BMI: 37.3% Weight goal: 150 lbs % Weight goal met: 31%  24-hr recall:  B (8 AM): water + vitamins Snk ( AM): N/A   L (12 PM): chicken broth or chicken salad (1 oz) Snk (4 PM): SF popsickle  D (7 PM): Soy protein shake (4oz)   Fluid intake: Propel zero, Crystal light, Water Estimated total protein intake: ~20g  Medications: Only taking Ambien Supplementation: Taking supplements per ASMBS guidelines  Using straws: No Drinking while eating: No Hair loss: No Carbonated beverages: No N/V/D/C: Occasional nausea Dumping syndrome: No  Recent physical activity:  Walking the dog, 4 times/day, 10-15 minutes  Progress Towards Goal(s):  In progress.   Nutritional Diagnosis:  [place]-3.3 Overweight/obesity As related to recent gastric bypass surgery.  As evidenced by pt continuing to follow gastric bypass dietary guidelines for continued weight loss.    Intervention:    Follow Phase 3A: Soft High Protein Phase  Eat 3-6 small meals/snacks, every 3-5 hrs  Increase lean protein foods to meet 80g goal  Increase fluid intake to 64oz +  Avoid drinking 15 minutes before, during and 30 minutes after eating  Aim for >30 min of physical activity daily per MD  Monitoring/Evaluation:  Dietary intake, exercises, and body weight. Follow up in 6 weeks for 2 month post-op visit.

## 2011-04-04 NOTE — Patient Instructions (Addendum)
Goals:  Follow Phase 3A: Soft High Protein Phase  Eat 3-6 small meals/snacks, every 3-5 hrs  Increase lean protein foods to meet 80g goal  Increase fluid intake to 64oz +  Avoid drinking 15 minutes before, during and 30 minutes after eating  Aim for >30 min of physical activity daily per MD   

## 2011-05-12 ENCOUNTER — Ambulatory Visit (INDEPENDENT_AMBULATORY_CARE_PROVIDER_SITE_OTHER): Payer: Private Health Insurance - Indemnity | Admitting: Surgery

## 2011-05-12 DIAGNOSIS — Z9884 Bariatric surgery status: Secondary | ICD-10-CM

## 2011-05-12 NOTE — Progress Notes (Signed)
[name] comes in today with some vague UTI type symptoms.  She had several interventions as a child including bladder stem stretching.  She reports some discomfort and dark urine.  She looks good and has lost 32 lbs.   Will treat empirically with Keflex 500 BID x 5 days and see her back in 6 weeks.

## 2011-05-16 ENCOUNTER — Encounter: Payer: Managed Care, Other (non HMO) | Attending: Surgery | Admitting: *Deleted

## 2011-05-16 ENCOUNTER — Encounter: Payer: Self-pay | Admitting: *Deleted

## 2011-05-16 DIAGNOSIS — Z713 Dietary counseling and surveillance: Secondary | ICD-10-CM | POA: Insufficient documentation

## 2011-05-16 DIAGNOSIS — Z9884 Bariatric surgery status: Secondary | ICD-10-CM | POA: Insufficient documentation

## 2011-05-16 DIAGNOSIS — Z09 Encounter for follow-up examination after completed treatment for conditions other than malignant neoplasm: Secondary | ICD-10-CM | POA: Insufficient documentation

## 2011-05-16 NOTE — Patient Instructions (Signed)
Goals:  Follow Phase 3B: High Protein + Non-Starchy Vegetables  Eat 3-6 small meals/snacks, every 3-5 hrs  Increase lean protein foods to meet 60-80g goal  Increase fluid intake to 64oz +  Add 15 grams of carbohydrate (fruit, whole grain, starchy vegetable) with meals  Avoid drinking 15 minutes before, during and 30 minutes after eating  Aim for >30 min of physical activity daily  

## 2011-05-16 NOTE — Progress Notes (Signed)
  Follow-up visit: 2 Months Post-Operative Gastric Bypass Surgery   Medical Nutrition Therapy: Appt start time: 1205 end time: 1235  Assessment: Primary concerns today: post-operative bariatric surgery nutrition management. [name] reports that food is still "getting stuck" and reports very frequent vomiting. She is concerned with this and plans to continue discussing this with her surgeon.  Weight today: 198.7 lbs  Weight change: 18.6 lbs  Total weight lost: 47.9 lbs  BMI: 34.2%  Weight goal: 150 lbs   24-hr recall:  B (8 AM): 1/2 cup cantalope Snk (AM): N/A  L (12 PM): Chicken or Ham (deli meat) w/ cheese (2 oz) on 1/2 wrap Snk (4 PM): Strawberries OR grapes w/ cheese (1/2 cup) D (7 PM): Broth OR Beef Stew (1 cup) S (PM): SF popsicle  Fluid intake: Propel zero, Crystal light, Water = 30 oz Estimated total protein intake: ~30-40g   Medications: See updated medication list Supplementation: Taking supplements per ASMBS guidelines   Using straws: No  Drinking while eating: No  Hair loss: No  Carbonated beverages: No  N/V/D/C: Occasional nausea/vomiting Dumping syndrome: No   Recent physical activity: Walking the dog, 4 times/day, 10-15 minutes   Progress Towards Goal(s): In progress.   Nutritional Diagnosis:  [place]-3.3 Overweight/obesity As related to recent gastric bypass surgery. As evidenced by pt continuing to follow gastric bypass dietary guidelines for continued weight loss.   Intervention: Nutrition education.  Monitoring/Evaluation: Dietary intake, exercises, and body weight. Follow up in 6-8 weeks for 3-4 month post-op visit.

## 2011-05-20 LAB — DIFFERENTIAL
Eosinophils Absolute: 0.2
Lymphocytes Relative: 17
Lymphs Abs: 1
Monocytes Relative: 8
Neutro Abs: 4.1
Neutrophils Relative %: 72

## 2011-05-20 LAB — CBC
MCV: 86.6
Platelets: 234
RBC: 4.06
WBC: 5.8

## 2011-05-20 LAB — URINALYSIS, ROUTINE W REFLEX MICROSCOPIC
Glucose, UA: NEGATIVE
Nitrite: NEGATIVE
Specific Gravity, Urine: 1.034 — ABNORMAL HIGH
pH: 6

## 2011-05-20 LAB — URINE CULTURE: Colony Count: 50000

## 2011-05-20 LAB — PREGNANCY, URINE: Preg Test, Ur: NEGATIVE

## 2011-05-20 LAB — BASIC METABOLIC PANEL
BUN: 8
Calcium: 8.9
Chloride: 108
Creatinine, Ser: 1.11
GFR calc Af Amer: >60
GFR calc non Af Amer: 57 — ABNORMAL LOW

## 2011-05-20 LAB — URINE MICROSCOPIC-ADD ON

## 2011-06-27 ENCOUNTER — Encounter: Payer: Managed Care, Other (non HMO) | Attending: Surgery | Admitting: *Deleted

## 2011-06-27 ENCOUNTER — Encounter: Payer: Self-pay | Admitting: *Deleted

## 2011-06-27 DIAGNOSIS — Z713 Dietary counseling and surveillance: Secondary | ICD-10-CM | POA: Insufficient documentation

## 2011-06-27 DIAGNOSIS — Z9884 Bariatric surgery status: Secondary | ICD-10-CM | POA: Insufficient documentation

## 2011-06-27 DIAGNOSIS — Z09 Encounter for follow-up examination after completed treatment for conditions other than malignant neoplasm: Secondary | ICD-10-CM | POA: Insufficient documentation

## 2011-06-27 NOTE — Progress Notes (Signed)
  Follow-up visit: 3 Month Post-Operative Gastric Bypass Surgery  Medical Nutrition Therapy:  Appt start time: 1200 end time:  1230.  Assessment:  Primary concerns today: post-operative bariatric surgery nutrition management.  Weight today: 183.2 lbs Weight change: 15 lbs Total weight lost: 62.9 lbs (73.6 lbs total) BMI: 31.5% Weight goal: 150 lbs Surgery date: 03/14/11 Start wt @ [facility]: 256.4 lbs  24-hr recall:  B (AM): 1/2 cup fruit Snk (AM): N/A   L (PM): 1/2 of a Flatbread sandwich with deli meat (2 oz protein) Snk (PM): N/A  D (PM): 1/2 cup beef stew or chili Snk (PM): SF popsickle  Fluid intake: water, crystal light = 32-40 oz Estimated total protein intake: 30-40 g  Medications: See updated medication list Supplementation: Taking supplements regularly; No problems reported  Using straws: No Drinking while eating: None Hair loss: Yes Carbonated beverages: No N/V/D/C: None reported Dumping syndrome: Yes  Recent physical activity:  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   Nutritional Diagnosis:  [place]-5.7.1 Inadequate protein intake As related to small portions and infrequent protein rich food intake.  As evidenced by pt meeting 50-60% of estimated protein needs.    Intervention:  Nutrition education.  Monitoring/Evaluation:  Dietary intake, exercise, and body weight. Follow up in 3 months for 6 month post-op visit.

## 2011-06-27 NOTE — Patient Instructions (Signed)
Goals:  Follow Phase 3B: High Protein + Non-Starchy Vegetables  Eat 3-6 small meals/snacks, every 3-5 hrs  Increase lean protein foods to meet 60-80g goal  Increase fluid intake to 64oz +  Add 15 grams (or 1/2 cup) of carbohydrate (fruit, whole grain, starchy vegetable) with meals  Aim for >30 min of physical activity daily

## 2011-07-01 ENCOUNTER — Ambulatory Visit (INDEPENDENT_AMBULATORY_CARE_PROVIDER_SITE_OTHER): Payer: Managed Care, Other (non HMO) | Admitting: Surgery

## 2011-07-01 ENCOUNTER — Encounter (INDEPENDENT_AMBULATORY_CARE_PROVIDER_SITE_OTHER): Payer: Self-pay | Admitting: Surgery

## 2011-07-01 DIAGNOSIS — I10 Essential (primary) hypertension: Secondary | ICD-10-CM | POA: Insufficient documentation

## 2011-07-01 DIAGNOSIS — E785 Hyperlipidemia, unspecified: Secondary | ICD-10-CM

## 2011-07-01 LAB — CBC
HCT: 35.4 % — ABNORMAL LOW (ref 36.0–46.0)
MCHC: 33.1 g/dL (ref 30.0–36.0)
Platelets: 213 10*3/uL (ref 150–400)
RDW: 15.6 % — ABNORMAL HIGH (ref 11.5–15.5)
WBC: 6.3 10*3/uL (ref 4.0–10.5)

## 2011-07-01 NOTE — Progress Notes (Signed)
[name] is 3.6 months out from her Roux-en-Y gastric bypass. She has lost 49.6 pounds and today's BMI is 31.6. She is followed by Dr. Harold Hedge. She is having some hair loss tissues and Amy Bridges who saw her regarding dietary issues is concerned about her protein levels. A medical ahead and order a CBC and a CMET.  Return 4 months

## 2011-09-27 ENCOUNTER — Encounter: Payer: Self-pay | Admitting: *Deleted

## 2011-09-27 ENCOUNTER — Encounter: Payer: Worker's Compensation | Attending: Surgery | Admitting: *Deleted

## 2011-09-27 DIAGNOSIS — Z09 Encounter for follow-up examination after completed treatment for conditions other than malignant neoplasm: Secondary | ICD-10-CM | POA: Insufficient documentation

## 2011-09-27 DIAGNOSIS — Z713 Dietary counseling and surveillance: Secondary | ICD-10-CM | POA: Insufficient documentation

## 2011-09-27 DIAGNOSIS — Z9884 Bariatric surgery status: Secondary | ICD-10-CM

## 2011-09-27 NOTE — Progress Notes (Signed)
  Follow-up visit: 6 Month Post-Operative Gastric Bypass Surgery  Medical Nutrition Therapy:  Appt start time: 1205 end time:  1236.  Assessment:  Primary concerns today: post-operative bariatric surgery nutrition management. [name] is doing well s/p RNY Gastric Bypass however continues to struggle with protein intake. She likely does not consume the recommend intake for protein however notes that her hair loss has improved. She has started Iron supplements per PCP and notes chronic constipation however only drinks ~16 oz/day. Recommended increasing fluid and discussing more elemental form of iron supplement with her PCP.  Weight today: 165.1  lbs Weight change: 18.1 lbs Total weight lost: 81 lbs (91.7 lbs total) BMI: 28.5% Weight goal: 150 lbs  Surgery date: 03/14/11 Start wt @ [facility]: 256.4 lbs  24-hr recall:  B (AM): 1 oz cheese Snk (AM): N/A   L (PM): [place] deli meat w/ cheese (3 oz) Snk (PM): 1 snack size bag of chips OR [employer] beef jerky  D (PM): 1/2 cup beef stew or chili OR 2-3 oz meat w/ green beans Snk (PM): Poptart (occasionally)  Fluid intake: water, crystal light, G2 = 16-32 oz Estimated total protein intake: 30-40 g  Medications: See updated medication list Supplementation: Taking supplements regularly; Added iron supplement per PCP (65mg )  Using straws: No Drinking while eating: None Hair loss: Slightly improving  Carbonated beverages: No N/V/D/C: Occasional constipation  Dumping syndrome: No  Recent physical activity:  Unable to exercise at this time due to obligation to care for infant grandson  Progress Towards Goal(s):  Some progress.   Nutritional Diagnosis:  NI-5.7.1 Inadequate protein intake As related to small portions and infrequent protein rich food intake.  As evidenced by pt meeting 50-60% of estimated protein needs.    Intervention:  Nutrition education.  Monitoring/Evaluation:  Dietary intake, exercise, and body weight. Follow up in 3-6 months  for 3-12 month post-op visit.

## 2011-09-27 NOTE — Patient Instructions (Addendum)
Goals:  Follow Phase 3B: High Protein + Non-Starchy Vegetables  Follow mostly protein-rich diet Eat 3-6 small meals/snacks, every 3-5 hrs  Increase lean protein foods to meet 60-80g goal  Increase fluid intake to 64oz + Avoid all sugar-sweetened beverages (juices) Add 15 grams (or 1/2 cup) of carbohydrate (fruit, whole grain, starchy vegetable) with meals  Aim for >30 min of physical activity daily

## 2011-10-14 ENCOUNTER — Ambulatory Visit (INDEPENDENT_AMBULATORY_CARE_PROVIDER_SITE_OTHER): Payer: Self-pay | Admitting: Surgery

## 2011-11-21 ENCOUNTER — Emergency Department (HOSPITAL_COMMUNITY): Payer: Managed Care, Other (non HMO)

## 2011-11-21 ENCOUNTER — Encounter (HOSPITAL_COMMUNITY): Payer: Self-pay | Admitting: *Deleted

## 2011-11-21 ENCOUNTER — Emergency Department (HOSPITAL_COMMUNITY)
Admission: EM | Admit: 2011-11-21 | Discharge: 2011-11-21 | Disposition: A | Discharge status: Home / Self Care | Payer: Managed Care, Other (non HMO) | Attending: Emergency Medicine | Admitting: Emergency Medicine

## 2011-11-21 DIAGNOSIS — R42 Dizziness and giddiness: Secondary | ICD-10-CM | POA: Insufficient documentation

## 2011-11-21 DIAGNOSIS — R0602 Shortness of breath: Secondary | ICD-10-CM | POA: Insufficient documentation

## 2011-11-21 DIAGNOSIS — K219 Gastro-esophageal reflux disease without esophagitis: Secondary | ICD-10-CM | POA: Insufficient documentation

## 2011-11-21 DIAGNOSIS — R630 Anorexia: Secondary | ICD-10-CM | POA: Insufficient documentation

## 2011-11-21 DIAGNOSIS — L98499 Non-pressure chronic ulcer of skin of other sites with unspecified severity: Secondary | ICD-10-CM | POA: Insufficient documentation

## 2011-11-21 DIAGNOSIS — E860 Dehydration: Secondary | ICD-10-CM

## 2011-11-21 DIAGNOSIS — R6889 Other general symptoms and signs: Secondary | ICD-10-CM | POA: Insufficient documentation

## 2011-11-21 DIAGNOSIS — M129 Arthropathy, unspecified: Secondary | ICD-10-CM | POA: Insufficient documentation

## 2011-11-21 DIAGNOSIS — R070 Pain in throat: Secondary | ICD-10-CM | POA: Insufficient documentation

## 2011-11-21 DIAGNOSIS — E785 Hyperlipidemia, unspecified: Secondary | ICD-10-CM | POA: Insufficient documentation

## 2011-11-21 DIAGNOSIS — R599 Enlarged lymph nodes, unspecified: Secondary | ICD-10-CM | POA: Insufficient documentation

## 2011-11-21 DIAGNOSIS — J3489 Other specified disorders of nose and nasal sinuses: Secondary | ICD-10-CM | POA: Insufficient documentation

## 2011-11-21 DIAGNOSIS — L0100 Impetigo, unspecified: Secondary | ICD-10-CM | POA: Insufficient documentation

## 2011-11-21 DIAGNOSIS — R5381 Other malaise: Secondary | ICD-10-CM | POA: Insufficient documentation

## 2011-11-21 DIAGNOSIS — Z79899 Other long term (current) drug therapy: Secondary | ICD-10-CM | POA: Insufficient documentation

## 2011-11-21 DIAGNOSIS — I10 Essential (primary) hypertension: Secondary | ICD-10-CM | POA: Insufficient documentation

## 2011-11-21 LAB — DIFFERENTIAL
Basophils Absolute: 0 10*3/uL (ref 0.0–0.1)
Eosinophils Absolute: 0.2 10*3/uL (ref 0.0–0.7)
Eosinophils Relative: 3 % (ref 0–5)
Lymphocytes Relative: 28 % (ref 12–46)
Neutrophils Relative %: 62 % (ref 43–77)

## 2011-11-21 LAB — URINE MICROSCOPIC-ADD ON

## 2011-11-21 LAB — BASIC METABOLIC PANEL
CO2: 30 mEq/L (ref 19–32)
Calcium: 9.6 mg/dL (ref 8.4–10.5)
GFR calc non Af Amer: >90 mL/min (ref >90)
Potassium: 3.2 mEq/L — ABNORMAL LOW (ref 3.5–5.1)
Sodium: 140 mEq/L (ref 135–145)

## 2011-11-21 LAB — GLUCOSE, CAPILLARY: Glucose-Capillary: 81 mg/dL (ref 70–99)

## 2011-11-21 LAB — CBC
MCV: 90.3 fL (ref 78.0–100.0)
Platelets: 213 10*3/uL (ref 150–400)
RBC: 4 MIL/uL (ref 3.87–5.11)
RDW: 13.2 % (ref 11.5–15.5)
WBC: 5.7 10*3/uL (ref 4.0–10.5)

## 2011-11-21 LAB — URINALYSIS, ROUTINE W REFLEX MICROSCOPIC
Protein, ur: NEGATIVE mg/dL
Specific Gravity, Urine: 1.019 (ref 1.005–1.030)
Urobilinogen, UA: 1 mg/dL (ref 0.0–1.0)

## 2011-11-21 LAB — PREGNANCY, URINE: Preg Test, Ur: NEGATIVE

## 2011-11-21 MED ORDER — SODIUM CHLORIDE 0.9 % IV BOLUS (SEPSIS)
1000.0000 mL | Freq: Once | INTRAVENOUS | Status: AC
  Administered 2011-11-21: 1000 mL via INTRAVENOUS

## 2011-11-21 MED ORDER — MECLIZINE HCL 50 MG PO TABS: 50.0000 mg | ORAL_TABLET | Freq: Three times a day (TID) | ORAL | Status: AC | PRN

## 2011-11-21 MED ORDER — POTASSIUM CHLORIDE CRYS ER 20 MEQ PO TBCR
40.0000 meq | EXTENDED_RELEASE_TABLET | Freq: Once | ORAL | Status: AC
  Administered 2011-11-21: 40 meq via ORAL
  Filled 2011-11-21: qty 2

## 2011-11-21 MED ORDER — ONDANSETRON HCL 4 MG PO TABS: 4.0000 mg | ORAL_TABLET | Freq: Four times a day (QID) | ORAL | Status: AC

## 2011-11-21 MED ORDER — MUPIROCIN CALCIUM 2 % NA OINT: TOPICAL_OINTMENT | NASAL | Status: AC

## 2011-11-21 MED ORDER — ONDANSETRON HCL 4 MG/2ML IJ SOLN
4.0000 mg | Freq: Once | INTRAMUSCULAR | Status: AC
  Administered 2011-11-21: 4 mg via INTRAVENOUS
  Filled 2011-11-21: qty 2

## 2011-11-21 MED ORDER — SODIUM CHLORIDE 0.9 % IV BOLUS (SEPSIS)
500.0000 mL | Freq: Once | INTRAVENOUS | Status: AC
  Administered 2011-11-21: 500 mL via INTRAVENOUS

## 2011-11-21 NOTE — ED Notes (Signed)
Pt alert and oriented x4. Respirations even and unlabored, bilateral symmetrical rise and fall of chest. Skin warm and dry. In no acute distress. Denies needs.   

## 2011-11-21 NOTE — ED Notes (Signed)
Pt reports dizziness and weakness over last 2 weeks r/t decreased appetite, decreased energy. Has also noticed a bump under nose that she is concerned about. Feels like head is "full of water" and swollen, mouth is very dry. Also reports feeling like at times she has a hard time catching her breath.

## 2011-11-21 NOTE — ED Notes (Signed)
md at bedside

## 2011-11-21 NOTE — ED Provider Notes (Signed)
Medical screening examination/treatment/procedure(s) were performed by non-physician practitioner and as supervising physician I was immediately available for consultation/collaboration.   Mahima Hottle, MD 11/21/11 1458 

## 2011-11-21 NOTE — ED Provider Notes (Signed)
History     CSN: [id number]  Arrival date & time 11/21/11  1006   First MD Initiated Contact with Patient 11/21/11 1025      Chief Complaint  Patient presents with  . Dizziness  . Weakness    (Consider location/radiation/quality/duration/timing/severity/associated sxs/prior treatment) HPI  36 year old female presents with a chief complaints of generalized fatigue, dizziness, and loss of appetite. Patient states 2 weeks ago she was experiencing some migraine headache that felt similar to her usual headache. Headache lasted for several hours and resolved however she subsequently experiencing nasal congestion, occasional sneezing, sore throat, and intermittent bouts of dizziness. She describes the dizziness as herself spinning. Dizziness usually lasts for less than hour and resolves spontaneously.  Continued to endorse sinus congestion, dry mouth, and a blister on her nose.  Symptom has been ongoing for 2 weeks, persistent, and not improving. She denies any significant pain. She denies fever, nausea, vomiting, diarrhea, chest pain, abdominal pain, back pain, or dysuria. Pt experiencing intermittent bouts of SOB that lasted for minutes and resolved.  It is non exertional. Denies calf pain, or leg swelling.  Denies taking birth control pills, having recent surgery or prolonged bed rest.   Pt has gastric bypass less than a year ago and sts she has been doing well since surgery until recently.    Past Medical History  Diagnosis Date  . Hypertension   . Hyperlipidemia   . Headache, migraine   . Wears glasses   . Arthritis   . Incontinence   . Headaches, cluster   . GERD (gastroesophageal reflux disease)   . Plantar fasciitis     Past Surgical History  Procedure Date  . Plantar fasciectomy 2006, 2010  . Tubal ligation   . Cholecystectomy   . Gastric bypass     No family history on file.  History  Substance Use Topics  . Smoking status: Never Smoker   . Smokeless tobacco: Never  Used  . Alcohol Use: No    OB History    Grav Para Term Preterm Abortions TAB SAB Ect Mult Living                  Review of Systems  All other systems reviewed and are negative.    Allergies  [place]  Home Medications   Current Outpatient Rx  Name Route Sig Dispense Refill  . AMPHETAMINE-DEXTROAMPHETAMINE 20 MG PO TABS Oral Take 20 mg by mouth daily.    Marland Kitchen CALCIUM CITRATE 950 MG PO TABS Oral Take 6 tablets by mouth daily.     . MULTI-VITAMIN/MINERALS PO TABS Oral Take 2 tablets by mouth daily.      Marland Kitchen RIZATRIPTAN BENZOATE 10 MG PO TABS Oral Take 10 mg by mouth as needed. May repeat in 2 hours if needed    . VITAMIN B-12 1000 MCG PO TABS Oral Take 1,000 mcg by mouth daily.      Marland Kitchen ZOLPIDEM TARTRATE 10 MG PO TABS Oral Take 10 mg by mouth at bedtime as needed.        BP 122/87  Pulse 88  Temp 97.9 F (36.6 C)  Resp 16  SpO2 98%  Physical Exam  Constitutional: She is oriented to person, place, and time. She appears well-developed and well-nourished. No distress.  HENT:  Head: Normocephalic and atraumatic.  Right Ear: Hearing and external ear normal. Tympanic membrane is retracted.  Left Ear: Hearing and external ear normal. Tympanic membrane is retracted.  Nose:    Mouth/Throat: Uvula  is midline. Mucous membranes are dry. Posterior oropharyngeal erythema present.  Eyes: Conjunctivae and EOM are normal. Pupils are equal, round, and reactive to light.       No nystagmus  Neck: Normal range of motion. Neck supple. No Brudzinski's sign and no Kernig's sign noted.       Anterior cervical lymphadenopathy  Cardiovascular: Normal rate and regular rhythm.   Pulmonary/Chest: Effort normal and breath sounds normal. No respiratory distress. She exhibits no tenderness.  Abdominal: Soft. There is no tenderness.  Musculoskeletal: Normal range of motion.       No pedal edema no calf tenderness  Neurological: She is alert and oriented to person, place, and time.  Skin: Skin is warm.    Psychiatric: She has a normal mood and affect.    ED Course  Procedures (including critical care time)  Labs Reviewed - No data to display No results found.   No diagnosis found.  Results for orders placed during the hospital encounter of 11/21/11  CBC      Component Value Range   WBC 5.7  4.0 - 10.5 (K/uL)   RBC 4.00  3.87 - 5.11 (MIL/uL)   Hemoglobin 12.2  12.0 - 15.0 (g/dL)   HCT 16.1  09.6 - 04.5 (%)   MCV 90.3  78.0 - 100.0 (fL)   MCH 30.5  26.0 - 34.0 (pg)   MCHC 33.8  30.0 - 36.0 (g/dL)   RDW 40.9  81.1 - 91.4 (%)   Platelets 213  150 - 400 (K/uL)  DIFFERENTIAL      Component Value Range   Neutrophils Relative 62  43 - 77 (%)   Neutro Abs 3.6  1.7 - 7.7 (K/uL)   Lymphocytes Relative 28  12 - 46 (%)   Lymphs Abs 1.6  0.7 - 4.0 (K/uL)   Monocytes Relative 7  3 - 12 (%)   Monocytes Absolute 0.4  0.1 - 1.0 (K/uL)   Eosinophils Relative 3  0 - 5 (%)   Eosinophils Absolute 0.2  0.0 - 0.7 (K/uL)   Basophils Relative 1  0 - 1 (%)   Basophils Absolute 0.0  0.0 - 0.1 (K/uL)  BASIC METABOLIC PANEL      Component Value Range   Sodium 140  135 - 145 (mEq/L)   Potassium 3.2 (*) 3.5 - 5.1 (mEq/L)   Chloride 103  96 - 112 (mEq/L)   CO2 30  19 - 32 (mEq/L)   Glucose, Bld 88  70 - 99 (mg/dL)   BUN 7  6 - 23 (mg/dL)   Creatinine, Ser 7.82  0.50 - 1.10 (mg/dL)   Calcium 9.6  8.4 - 95.6 (mg/dL)   GFR calc non Af Amer >90  >90 (mL/min)   GFR calc Af Amer >90  >90 (mL/min)  URINALYSIS, ROUTINE W REFLEX MICROSCOPIC      Component Value Range   Color, Urine YELLOW  YELLOW    APPearance CLOUDY (*) CLEAR    Specific Gravity, Urine 1.019  1.005 - 1.030    pH 7.0  5.0 - 8.0    Glucose, UA NEGATIVE  NEGATIVE (mg/dL)   Hgb urine dipstick NEGATIVE  NEGATIVE    Bilirubin Urine NEGATIVE  NEGATIVE    Ketones, ur NEGATIVE  NEGATIVE (mg/dL)   Protein, ur NEGATIVE  NEGATIVE (mg/dL)   Urobilinogen, UA 1.0  0.0 - 1.0 (mg/dL)   Nitrite NEGATIVE  NEGATIVE    Leukocytes, UA TRACE (*)  NEGATIVE   PREGNANCY, URINE  Component Value Range   Preg Test, Ur NEGATIVE  NEGATIVE   GLUCOSE, CAPILLARY      Component Value Range   Glucose-Capillary 81  70 - 99 (mg/dL)   Comment 1 Documented in Chart     Comment 2 Notify RN    URINE MICROSCOPIC-ADD ON      Component Value Range   Squamous Epithelial / LPF FEW (*) RARE    WBC, UA 0-2  <3 (WBC/hpf)   Bacteria, UA MANY (*) RARE    Urine-Other MUCOUS PRESENT     Dg Chest 2 View  11/21/2011  *RADIOLOGY REPORT*  Clinical Data: Shortness of breath.  CHEST - 2 VIEW  Comparison: Chest x-ray 10/18/2007.  Findings: The cardiac silhouette, mediastinal and hilar contours are normal.  The lungs are clear.  No pleural effusion.  The bony thorax is intact.  IMPRESSION: No acute cardiopulmonary findings.  Original Report Authenticated By: P. Loralie Champagne, M.D.      MDM  URI sxs with increased dizziness and generalized fatigue x 2 weeks.  No appetites, no abd pain, no n/v/d.  Signs of dehydration.  Will give IVF, order diagnostic tests, and will continue to monitor.  Does not look toxic.  No focal neuro deficits.  PERC negative.  No significant sinus tenderness to suggest sinusitis.  12:35 PM Mildly decreased potassium level of 3.2. Supplementation given in the ED. UA shows no evidence of urinary tract infection. Patient has normal white count. Her chest x-ray shows no signs of acute changes. Patient noticed mild improvement with IV fluid hydration. No significant signs of dehydration on her labs.  Her dizziness is likely peripheral, and related to her retracted TM and sinus congestion.  No nystagmus on exam.  Will give mupirocin for skin lesion to nostril.  No hx of shingles or herpes simplex.       Fayrene Helper, PA-C 11/21/11 1351

## 2011-11-21 NOTE — Discharge Instructions (Signed)
Apply mupirocin cream to nose daily.  Discontinue cream after 5 days if no improvement.  Take Zofran for nausea, take meclizine for dizziness.  Drink plenty of fluid.  Return if your symptoms persist despite treatment.  Dehydration, Adult Dehydration is when you lose more fluids from the body than you take in. Vital organs like the kidneys, brain, and heart cannot function without a proper amount of fluids and salt. Any loss of fluids from the body can cause dehydration.  CAUSES   Vomiting.   Diarrhea.   Excessive sweating.   Excessive urine output.   Fever.  SYMPTOMS  Mild dehydration  Thirst.   Dry lips.   Slightly dry mouth.  Moderate dehydration  Very dry mouth.   Sunken eyes.   Skin does not bounce back quickly when lightly pinched and released.   Dark urine and decreased urine production.   Decreased tear production.   Headache.  Severe dehydration  Very dry mouth.   Extreme thirst.   Rapid, weak pulse (more than 100 beats per minute at rest).   Cold hands and feet.   Not able to sweat in spite of heat and temperature.   Rapid breathing.   Blue lips.   Confusion and lethargy.   Difficulty being awakened.   Minimal urine production.   No tears.  DIAGNOSIS  Your caregiver will diagnose dehydration based on your symptoms and your exam. Blood and urine tests will help confirm the diagnosis. The diagnostic evaluation should also identify the cause of dehydration. TREATMENT  Treatment of mild or moderate dehydration can often be done at home by increasing the amount of fluids that you drink. It is best to drink small amounts of fluid more often. Drinking too much at one time can make vomiting worse. Refer to the home care instructions below. Severe dehydration needs to be treated at the hospital where you will probably be given intravenous (IV) fluids that contain water and electrolytes. HOME CARE INSTRUCTIONS   Ask your caregiver about specific  rehydration instructions.   Drink enough fluids to keep your urine clear or pale yellow.   Drink small amounts frequently if you have nausea and vomiting.   Eat as you normally do.   Avoid:   Foods or drinks high in sugar.   Carbonated drinks.   Juice.   Extremely hot or cold fluids.   Drinks with caffeine.   Fatty, greasy foods.   Alcohol.   Tobacco.   Overeating.   Gelatin desserts.   Wash your hands well to avoid spreading bacteria and viruses.   Only take over-the-counter or prescription medicines for pain, discomfort, or fever as directed by your caregiver.   Ask your caregiver if you should continue all prescribed and over-the-counter medicines.   Keep all follow-up appointments with your caregiver.  SEEK MEDICAL CARE IF:  You have abdominal pain and it increases or stays in one area (localizes).   You have a rash, stiff neck, or severe headache.   You are irritable, sleepy, or difficult to awaken.   You are weak, dizzy, or extremely thirsty.  SEEK IMMEDIATE MEDICAL CARE IF:   You are unable to keep fluids down or you get worse despite treatment.   You have frequent episodes of vomiting or diarrhea.   You have blood or green matter (bile) in your vomit.   You have blood in your stool or your stool looks black and tarry.   You have not urinated in 6 to 8 hours, or  you have only urinated a small amount of very dark urine.   You have a fever.   You faint.  MAKE SURE YOU:   Understand these instructions.   Will watch your condition.   Will get help right away if you are not doing well or get worse.  Document Released: 08/15/2005 Document Revised: 08/04/2011 Document Reviewed: 04/04/2011 [facility] Patient Information 2012 [place].

## 2011-11-28 ENCOUNTER — Encounter (INDEPENDENT_AMBULATORY_CARE_PROVIDER_SITE_OTHER): Payer: Self-pay | Admitting: Surgery

## 2011-11-30 ENCOUNTER — Ambulatory Visit (INDEPENDENT_AMBULATORY_CARE_PROVIDER_SITE_OTHER): Payer: Self-pay | Admitting: Surgery

## 2011-12-05 ENCOUNTER — Encounter (INDEPENDENT_AMBULATORY_CARE_PROVIDER_SITE_OTHER): Payer: Self-pay | Admitting: Surgery

## 2012-01-16 ENCOUNTER — Emergency Department (HOSPITAL_COMMUNITY)
Admission: EM | Admit: 2012-01-16 | Discharge: 2012-01-16 | Disposition: A | Discharge status: Home / Self Care | Payer: Managed Care, Other (non HMO) | Attending: Emergency Medicine | Admitting: Emergency Medicine

## 2012-01-16 ENCOUNTER — Encounter (HOSPITAL_COMMUNITY): Payer: Self-pay | Admitting: Emergency Medicine

## 2012-01-16 DIAGNOSIS — K219 Gastro-esophageal reflux disease without esophagitis: Secondary | ICD-10-CM | POA: Insufficient documentation

## 2012-01-16 DIAGNOSIS — Z79899 Other long term (current) drug therapy: Secondary | ICD-10-CM | POA: Insufficient documentation

## 2012-01-16 DIAGNOSIS — E785 Hyperlipidemia, unspecified: Secondary | ICD-10-CM | POA: Insufficient documentation

## 2012-01-16 DIAGNOSIS — N12 Tubulo-interstitial nephritis, not specified as acute or chronic: Secondary | ICD-10-CM | POA: Insufficient documentation

## 2012-01-16 DIAGNOSIS — Z8739 Personal history of other diseases of the musculoskeletal system and connective tissue: Secondary | ICD-10-CM | POA: Insufficient documentation

## 2012-01-16 DIAGNOSIS — I10 Essential (primary) hypertension: Secondary | ICD-10-CM | POA: Insufficient documentation

## 2012-01-16 LAB — CBC
HCT: 36.6 % (ref 36.0–46.0)
Hemoglobin: 12.1 g/dL (ref 12.0–15.0)
MCH: 30 pg (ref 26.0–34.0)
MCV: 90.8 fL (ref 78.0–100.0)
RBC: 4.03 MIL/uL (ref 3.87–5.11)
WBC: 5.2 10*3/uL (ref 4.0–10.5)

## 2012-01-16 LAB — URINALYSIS, MICROSCOPIC ONLY
Glucose, UA: NEGATIVE mg/dL
Specific Gravity, Urine: 1.023 (ref 1.005–1.030)
Urobilinogen, UA: 1 mg/dL (ref 0.0–1.0)

## 2012-01-16 LAB — BASIC METABOLIC PANEL
BUN: 8 mg/dL (ref 6–23)
CO2: 28 mEq/L (ref 19–32)
Calcium: 9.1 mg/dL (ref 8.4–10.5)
Chloride: 105 mEq/L (ref 96–112)
Creatinine, Ser: 0.79 mg/dL (ref 0.50–1.10)
Glucose, Bld: 109 mg/dL — ABNORMAL HIGH (ref 70–99)

## 2012-01-16 MED ORDER — DEXTROSE 5 % IV SOLN
1.0000 g | Freq: Once | INTRAVENOUS | Status: AC
  Administered 2012-01-16: 1 g via INTRAVENOUS
  Filled 2012-01-16: qty 10

## 2012-01-16 MED ORDER — SODIUM CHLORIDE 0.9 % IV BOLUS (SEPSIS)
1000.0000 mL | Freq: Once | INTRAVENOUS | Status: AC
  Administered 2012-01-16: 1000 mL via INTRAVENOUS

## 2012-01-16 MED ORDER — HYDROMORPHONE HCL PF 1 MG/ML IJ SOLN
1.0000 mg | Freq: Once | INTRAMUSCULAR | Status: AC
  Administered 2012-01-16: 1 mg via INTRAVENOUS
  Filled 2012-01-16: qty 1

## 2012-01-16 MED ORDER — ONDANSETRON 8 MG PO TBDP: 8.0000 mg | ORAL_TABLET | Freq: Three times a day (TID) | ORAL | Status: AC | PRN

## 2012-01-16 MED ORDER — ONDANSETRON HCL 4 MG/2ML IJ SOLN
4.0000 mg | Freq: Once | INTRAMUSCULAR | Status: AC
  Administered 2012-01-16: 4 mg via INTRAVENOUS
  Filled 2012-01-16: qty 2

## 2012-01-16 MED ORDER — HYDROCODONE-ACETAMINOPHEN 5-500 MG PO TABS: 1.0000 | ORAL_TABLET | Freq: Four times a day (QID) | ORAL | Status: AC | PRN

## 2012-01-16 MED ORDER — CIPROFLOXACIN HCL 500 MG PO TABS: 500.0000 mg | ORAL_TABLET | Freq: Two times a day (BID) | ORAL | Status: AC

## 2012-01-16 NOTE — ED Provider Notes (Signed)
History     CSN: [id number]  Arrival date & time 01/16/12  3244   First MD Initiated Contact with Patient 01/16/12 [phone]      Chief Complaint  Patient presents with  . Abdominal Pain     The history is provided by the patient.   the patient reports suprapubic abdominal pain as well as nausea and vomiting.  Her pain and nausea started yesterday the vomiting began today.  Her vomitus nonbloody.  She feels slightly better after vomiting in the emergency department.  She gets his bypass one year ago and has had several urinary tract infections since.  She denies vaginal discharge.  She denies intermittent unilateral abdominal or flank pain.  Her pain is mild to moderate at this time.  Nothing worsens her symptoms.  Nothing improves her symptoms.  Past Medical History  Diagnosis Date  . Hypertension   . Hyperlipidemia   . Headache, migraine   . Wears glasses   . Incontinence   . Headaches, cluster   . GERD (gastroesophageal reflux disease)   . Plantar fasciitis   . Lumbago   . Bruises easily   . Sinus problem   . Wears glasses     reading glasses only  . Arthritis     foot and knee  . Sore throat     Past Surgical History  Procedure Date  . Plantar fasciectomy Right in 2006, Left in 2010  . Gastric bypass 03/14/11  . Cholecystectomy 07/2004  . Endometrial ablation 09/2003  . Tubal ligation 1999    No family history on file.  History  Substance Use Topics  . Smoking status: Never Smoker   . Smokeless tobacco: Never Used  . Alcohol Use: No    OB History    Grav Para Term Preterm Abortions TAB SAB Ect Mult Living                  Review of Systems  Gastrointestinal: Positive for abdominal pain.  All other systems reviewed and are negative.    Allergies  Morphine and related and Pentazocine lactate  Home Medications   Current Outpatient Rx  Name Route Sig Dispense Refill  . AMPHETAMINE-DEXTROAMPHETAMINE 20 MG PO TABS Oral Take 20 mg by mouth daily.    .  ASENAPINE MALEATE 10 MG SL SUBL Sublingual Place 1 tablet under the tongue at bedtime.    Marland Kitchen CALCIUM CITRATE 950 MG PO TABS Oral Take 6 tablets by mouth daily.     Marland Kitchen CRANBERRY 450 MG PO TABS Oral Take 1 tablet by mouth daily as needed.    . MULTI-VITAMIN/MINERALS PO TABS Oral Take 2 tablets by mouth daily.      Marland Kitchen NIACIN ER (ANTIHYPERLIPIDEMIC) 500 MG PO TBCR Oral Take 500 mg by mouth at bedtime.    Marland Kitchen RIZATRIPTAN BENZOATE 10 MG PO TABS Oral Take 10 mg by mouth as needed. May repeat in 2 hours if needed for migraine    . SIMVASTATIN 40 MG PO TABS Oral Take 40 mg by mouth every evening.    Marland Kitchen VITAMIN B-12 1000 MCG PO TABS Oral Take 1,000 mcg by mouth daily.      Marland Kitchen ZOLPIDEM TARTRATE 10 MG PO TABS Oral Take 10 mg by mouth at bedtime.     Marland Kitchen CIPROFLOXACIN HCL 500 MG PO TABS Oral Take 1 tablet (500 mg total) by mouth every 12 (twelve) hours. 14 tablet 0  . HYDROCODONE-ACETAMINOPHEN 5-500 MG PO TABS Oral Take 1 tablet by mouth  every 6 (six) hours as needed for pain. 15 tablet 0  . ONDANSETRON 8 MG PO TBDP Oral Take 1 tablet (8 mg total) by mouth every 8 (eight) hours as needed for nausea. 10 tablet 0    BP 107/69  Pulse 63  Temp(Src) 98.4 F (36.9 C) (Oral)  Resp 18  SpO2 96%  Physical Exam  Nursing note and vitals reviewed. Constitutional: She is oriented to person, place, and time. She appears well-developed and well-nourished. No distress.  HENT:  Head: Normocephalic and atraumatic.  Eyes: EOM are normal.  Neck: Normal range of motion.  Cardiovascular: Normal rate, regular rhythm and normal heart sounds.   Pulmonary/Chest: Effort normal and breath sounds normal.  Abdominal: Soft. She exhibits no distension. There is no tenderness.  Musculoskeletal: Normal range of motion.  Neurological: She is alert and oriented to person, place, and time.  Skin: Skin is warm and dry.  Psychiatric: She has a normal mood and affect. Judgment normal.    ED Course  Procedures (including critical care  time)  Labs Reviewed  URINALYSIS, WITH MICROSCOPIC - Abnormal; Notable for the following:    Color, Urine ORANGE (*) BIOCHEMICALS MAY BE AFFECTED BY COLOR   APPearance TURBID (*)    Hgb urine dipstick MODERATE (*)    Bilirubin Urine SMALL (*)    Ketones, ur 15 (*)    Nitrite POSITIVE (*)    Leukocytes, UA LARGE (*)    Bacteria, UA MANY (*)    Squamous Epithelial / LPF MANY (*)    All other components within normal limits  BASIC METABOLIC PANEL - Abnormal; Notable for the following:    Potassium 3.1 (*)    Glucose, Bld 109 (*)    All other components within normal limits  CBC  URINE CULTURE   No results found.   1. Pyelonephritis       MDM  The patient has evidence of urinary tract infection.  Her pain is significantly improved in the emergency department.  She's keeping oral fluids down.  It is recommended followup with the urologist given that she's been having recurring urinary tract infection since her gastric bypass.  She may need further evaluation of her anatomy.  Discharge home in good condition        Lyanne Co, MD 01/16/12 [phone]

## 2012-01-16 NOTE — Discharge Instructions (Signed)
Pyelonephritis, Adult Pyelonephritis is a kidney infection. A kidney infection can happen quickly, or it can last for a long time. HOME CARE   Take your medicine (antibiotics) as told. Finish it even if you start to feel better.   Keep all doctor visits as told.   Drink enough fluids to keep your pee (urine) clear or pale yellow.   Only take medicine as told by your doctor.  GET HELP RIGHT AWAY IF:   You have a fever.   You cannot take your medicine or drink fluids as told.   You have chills and shaking.   You feel very weak or pass out (faint).   You do not feel better after 2 days.  MAKE SURE YOU:  Understand these instructions.   Will watch your condition.   Will get help right away if you are not doing well or get worse.  Document Released: 09/22/2004 Document Revised: 08/04/2011 Document Reviewed: 02/02/2011 [employer] Patient Information 2012 [employer]. 

## 2012-01-16 NOTE — ED Notes (Signed)
Dr. Campos at bedside   

## 2012-01-16 NOTE — ED Notes (Signed)
Pt placed on continuous pulse ox and cycling BP 

## 2012-01-16 NOTE — ED Notes (Signed)
Pt c/o constant lower left sided abdominal pain that radiates down into pelvis area. States that she has had reoccuring bladder infection since gastric bypass surgery last July. Also states that she has trouble voiding since yesterday. Denies any n/v/d.

## 2012-01-17 LAB — URINE CULTURE: Colony Count: 65000

## 2012-01-18 NOTE — ED Notes (Signed)
+   urine Patient treated with Cipro- sensitivity listed-Chart sent to EDP office for review.

## 2012-01-20 NOTE — ED Notes (Signed)
Chart returned from EDP office. Appropriate abx coverage given. Please call patient to see if she has followed up with PCP or OB GYN and please instruct to do so for urine recheck. Reviewed by Drucie Opitz PA-C.

## 2012-03-05 IMAGING — CR DG CHEST 2V
2 series · 2 of 2 positions shown · non-contrast
Comparison: None.

CLINICAL DATA: Preoperative evaluation, bariatric patient.  No
chest pain or shortness of breath.

CHEST - 2 VIEW

[view not recorded (1 of 2)]
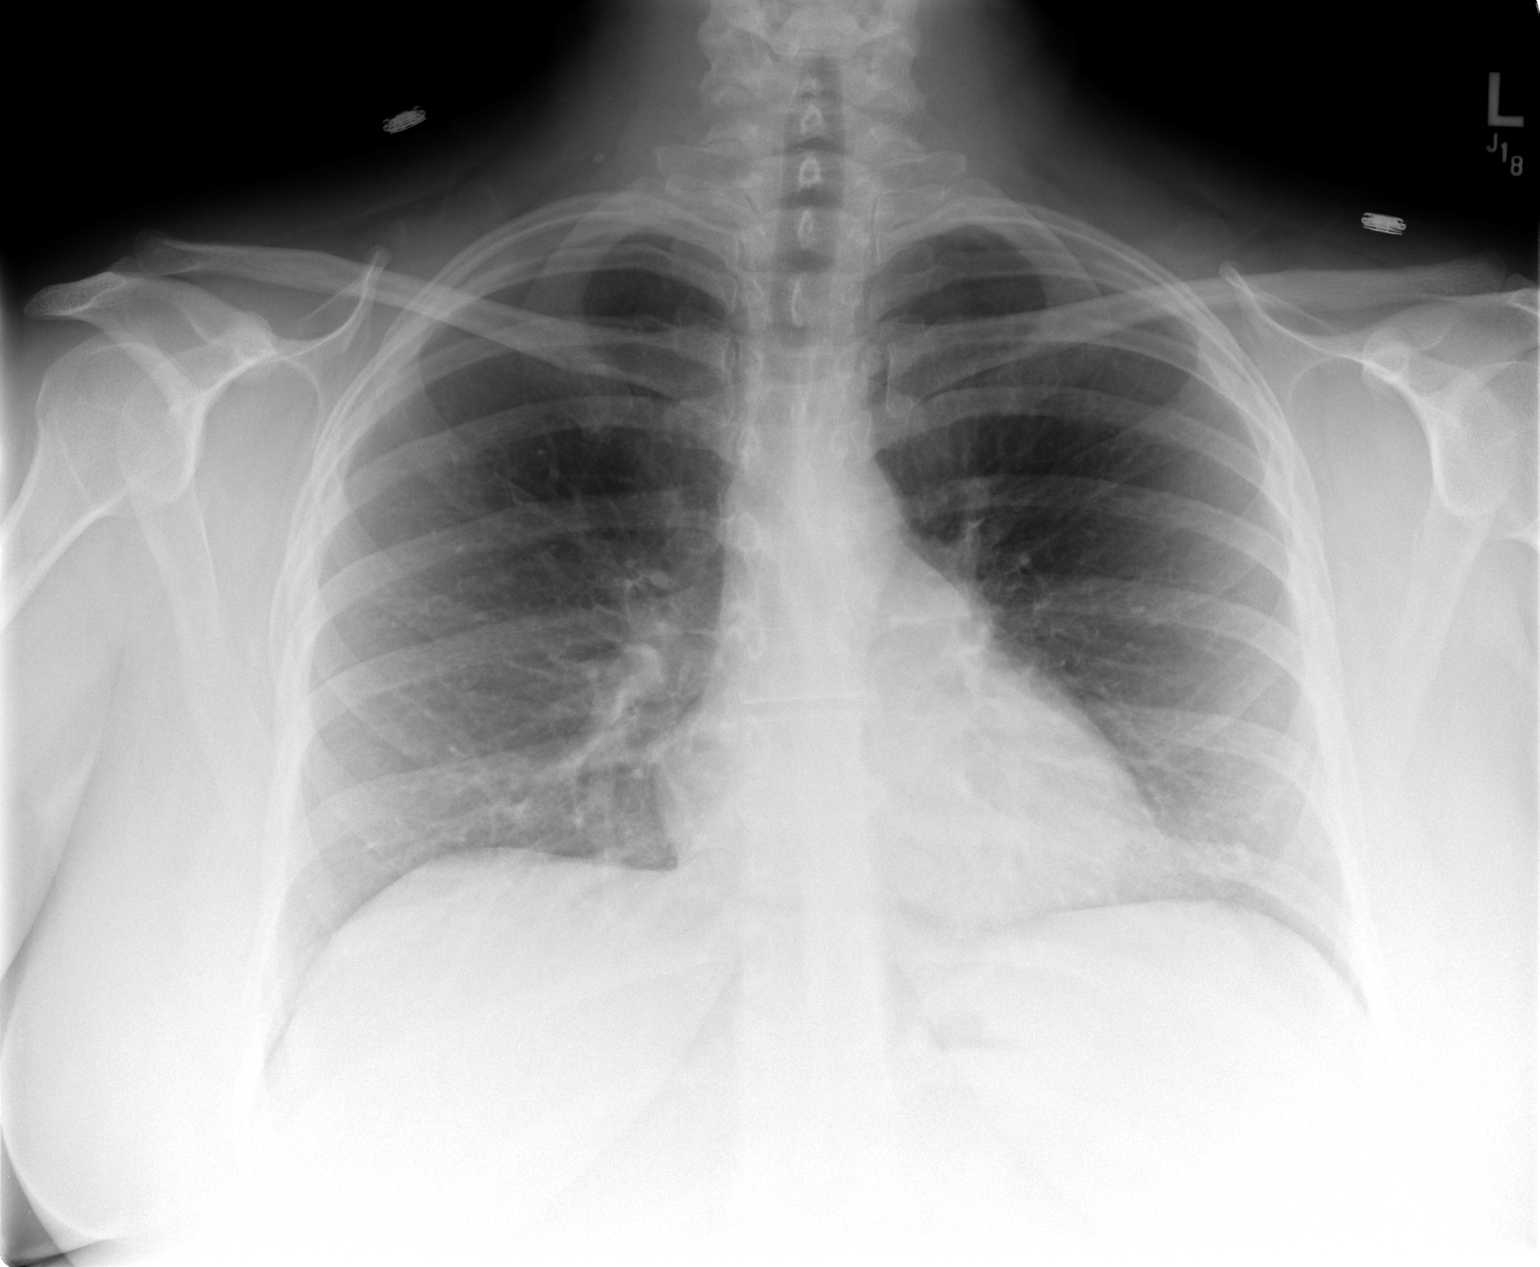

[view not recorded (2 of 2)]
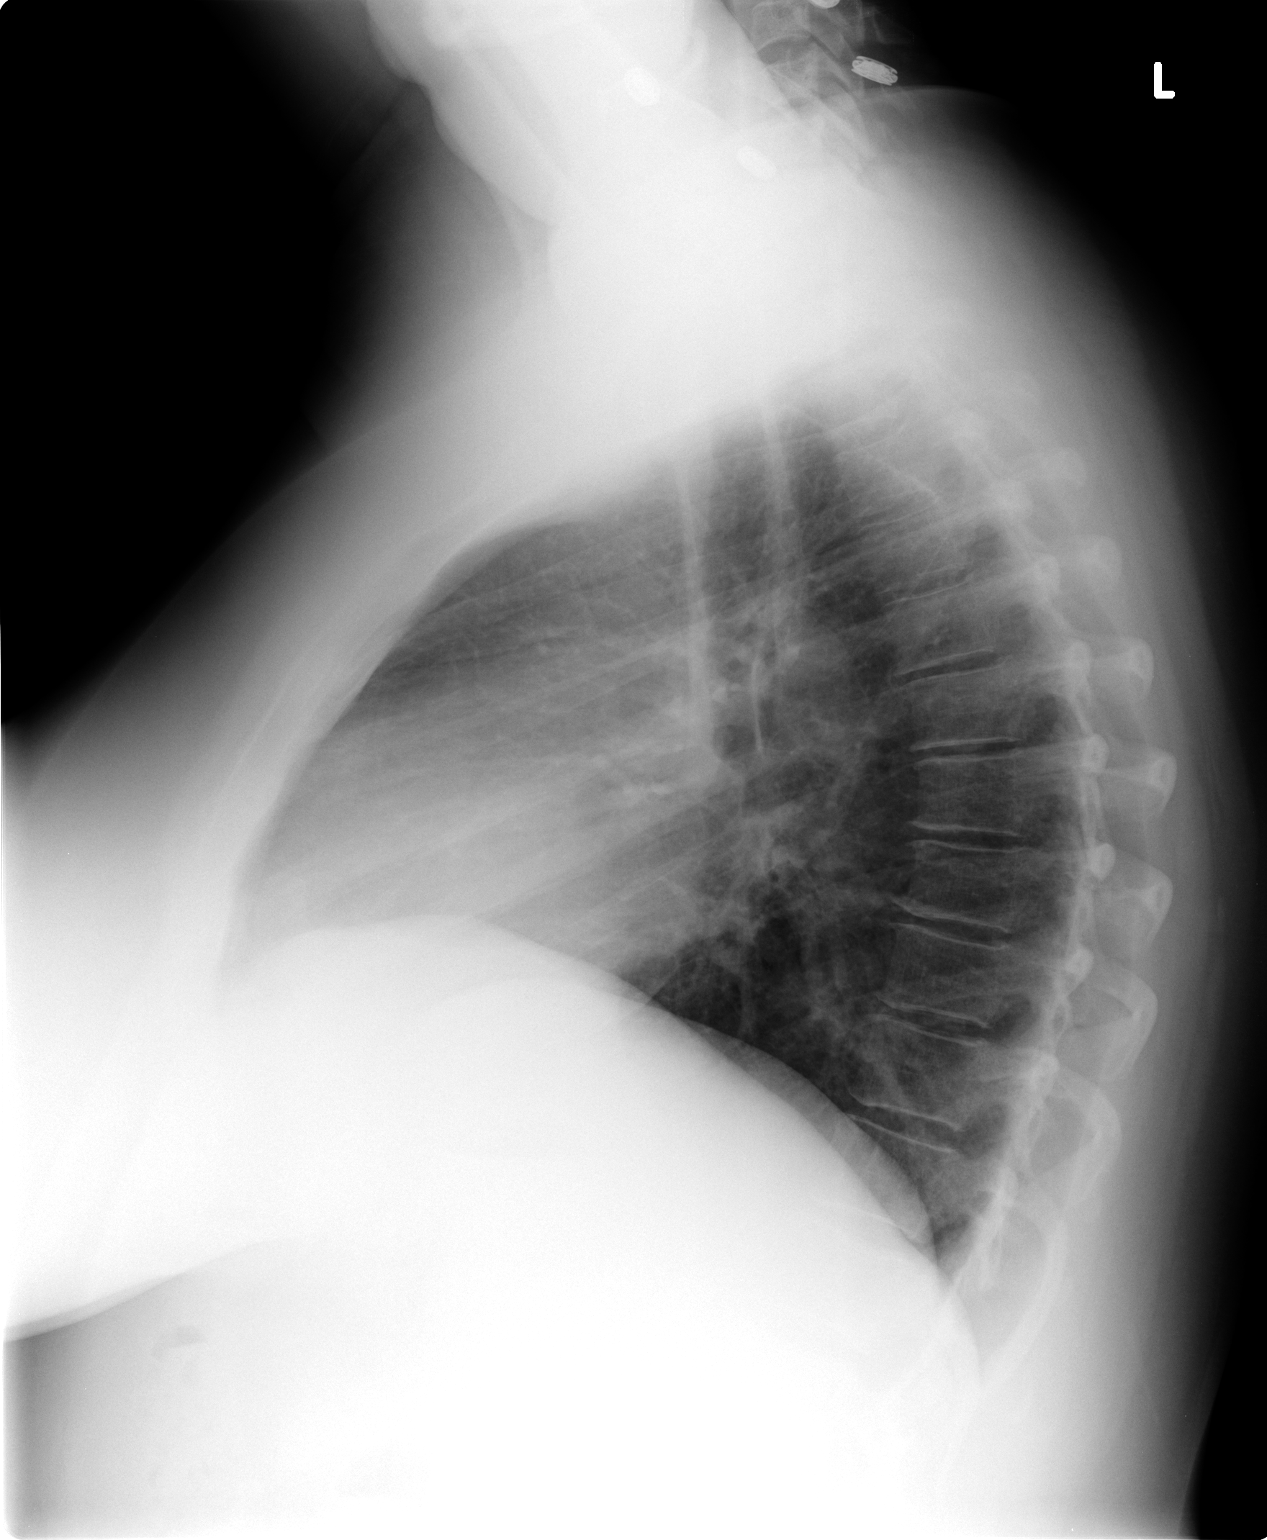

[2 of 2 positions shown; findings below may reference images not displayed]

FINDINGS: The lungs are low volume but clear bilaterally.  No
confluent airspace opacities, pleural effuions or pneumothoracies
are seen.  The heart is normal in size and contour.  The upper
abdomen and osseous structures are normal.
IMPRESSION: Low lung volumes without acute cardiopulmonary
disease.

## 2012-03-22 ENCOUNTER — Other Ambulatory Visit (INDEPENDENT_AMBULATORY_CARE_PROVIDER_SITE_OTHER): Payer: Self-pay | Admitting: Surgery

## 2012-03-22 ENCOUNTER — Encounter: Payer: Private Health Insurance - Indemnity | Attending: Surgery | Admitting: *Deleted

## 2012-03-22 ENCOUNTER — Encounter: Payer: Self-pay | Admitting: *Deleted

## 2012-03-22 ENCOUNTER — Ambulatory Visit (INDEPENDENT_AMBULATORY_CARE_PROVIDER_SITE_OTHER): Payer: Private Health Insurance - Indemnity | Admitting: Surgery

## 2012-03-22 ENCOUNTER — Encounter (INDEPENDENT_AMBULATORY_CARE_PROVIDER_SITE_OTHER): Payer: Self-pay | Admitting: Surgery

## 2012-03-22 VITALS — Ht 64.0 in | Wt 126.7 lb

## 2012-03-22 VITALS — BP 102/64 | HR 72 | Temp 97.6°F | Resp 16 | Ht 64.0 in | Wt 127.1 lb

## 2012-03-22 DIAGNOSIS — Z9884 Bariatric surgery status: Secondary | ICD-10-CM

## 2012-03-22 DIAGNOSIS — Z713 Dietary counseling and surveillance: Secondary | ICD-10-CM | POA: Insufficient documentation

## 2012-03-22 DIAGNOSIS — Z09 Encounter for follow-up examination after completed treatment for conditions other than malignant neoplasm: Secondary | ICD-10-CM | POA: Insufficient documentation

## 2012-03-22 DIAGNOSIS — E669 Obesity, unspecified: Secondary | ICD-10-CM

## 2012-03-22 NOTE — Patient Instructions (Signed)
Goals:  Follow Phase IV: Low Fat + High Protein  Eat 3-6 small meals/snacks, every 3-5 hrs  Increase lean protein foods to meet 60-80g goal  Increase fluid intake to 64oz +  Add 15 grams of carbohydrate (fruit, whole grain, starchy vegetable) with meals  Avoid drinking 15 minutes before, during and 30 minutes after eating  Aim for >30 min of physical activity daily

## 2012-03-22 NOTE — Patient Instructions (Addendum)
Thanks for your patience.  If you need further assistance after leaving the office, please call our office and speak with Tracy A.  [phone].  If you want to leave a message for Dr. Aston Lieske, please call his office phone at [phone]. 

## 2012-03-22 NOTE — Progress Notes (Signed)
  Follow-up visit: 12 Month Post-Operative Gastric Bypass Surgery  Medical Nutrition Therapy:  Appt start time: 1500  End time:  1530.  Primary concerns today:  Post-operative bariatric surgery nutrition management.  [name] is doing well s/p RNY Gastric Bypass however continues to struggle with protein intake. She likely does not consume the recommend intake for protein, however, notes that her hair loss has resolved. She continues to take iron supplements and now receives B-12 injections monthly. Fluid intake also remains low, though no constipation reported. Recommended increasing fluid and protein.   Surgery date: 03/14/11 Start wt @ [facility]: 256.4 lbs  Weight today: 126.7  lbs Weight change: 38.4 lbs Total weight lost: 119.4 lbs (129.7 lbs total) BMI: 21.7 kg/m^2 Weight goal: 150 lbs  24-hr recall: B (AM): Piece of fruit Snk (AM): N/A  L (PM): [place] deli meat w/ cheese (3 oz) w/ bread, mayo Snk (PM): Wheat thins or crackers (nabs) OR almonds D (PM): 1/2 cup beef stew or chili OR 2-3 oz meat w/ green beans or NONE Snk (PM): Poptart (occasionally)  Fluid intake: water, crystal light, Gatorade = 16-32 oz Estimated total protein intake: 45-50 g  Medications: See updated medication list; Not taking Maxalt or Zocor Supplementation: Taking MVI as directed; Averages only [phone] mg of calcium/day  Using straws: No Drinking while eating: None Hair loss: None Carbonated beverages: 4-7 oz regular soda/day  N/V/D/C: Recent ED visits = dehydration in Feb or March '13;  Kidney stones on 01/16/12 Dumping syndrome: None  Recent physical activity:  Unable to exercise at this time due to obligation to care for infant grandson  Progress Towards Goal(s):  Some progress.   Nutritional Diagnosis:  NI-5.7.1 Inadequate protein intake As related to small portions and infrequent protein rich food intake.  As evidenced by pt meeting 50-60% of estimated protein needs.    Intervention:  Nutrition  education.  Monitoring/Evaluation:  Dietary intake, exercise, and body weight. Follow up in 3-6 months for 3-12 month post-op visit.

## 2012-03-22 NOTE — Progress Notes (Signed)
[name] 36 y.o.  Body mass index is 21.82 kg/(m^2).  Patient Active Problem List  Diagnosis  . S/P gastric bypass  . Hypertension  . Hyperlipidemia    Allergies  Allergen Reactions  . Morphine And Related   . Pentazocine Lactate Other (See Comments)    Delusional and hallucinations    Past Surgical History  Procedure Date  . Plantar fasciectomy Right in 2006, Left in 2010  . Gastric bypass 03/14/11  . Cholecystectomy 07/2004  . Endometrial ablation 09/2003  . Tubal ligation 1999   [name],JOHN J., MD No diagnosis found.  [name] Looks great. She is one year out from a Roux-en-Y gastric bypass has lost 123 pounds. She is off her blood pressure medication, all for cholesterol medication, has decreasing headaches. She is taking her vitamins. She is followed by her medical doctors who have check her her other medicines. We will go ahead and schedule and her to check a B12 level, thiamine level, folate level. I will  see her back in the office in one year. We will communicate these blood work levels to her and her primary care doctors. Matt B. Daphine Deutscher, MD, [facility] [phone] beeper [phone]  03/22/2012 12:13 PM

## 2012-04-07 ENCOUNTER — Emergency Department (HOSPITAL_COMMUNITY)
Admission: EM | Admit: 2012-04-07 | Discharge: 2012-04-08 | Disposition: A | Discharge status: Home / Self Care | Payer: Private Health Insurance - Indemnity | Attending: Emergency Medicine | Admitting: Emergency Medicine

## 2012-04-07 ENCOUNTER — Encounter (HOSPITAL_COMMUNITY): Payer: Self-pay | Admitting: Emergency Medicine

## 2012-04-07 DIAGNOSIS — M549 Dorsalgia, unspecified: Secondary | ICD-10-CM | POA: Insufficient documentation

## 2012-04-07 DIAGNOSIS — Z79899 Other long term (current) drug therapy: Secondary | ICD-10-CM | POA: Insufficient documentation

## 2012-04-07 DIAGNOSIS — N200 Calculus of kidney: Secondary | ICD-10-CM | POA: Insufficient documentation

## 2012-04-07 DIAGNOSIS — Z8739 Personal history of other diseases of the musculoskeletal system and connective tissue: Secondary | ICD-10-CM | POA: Insufficient documentation

## 2012-04-07 DIAGNOSIS — I10 Essential (primary) hypertension: Secondary | ICD-10-CM | POA: Insufficient documentation

## 2012-04-07 DIAGNOSIS — Z9089 Acquired absence of other organs: Secondary | ICD-10-CM | POA: Insufficient documentation

## 2012-04-07 DIAGNOSIS — K219 Gastro-esophageal reflux disease without esophagitis: Secondary | ICD-10-CM | POA: Insufficient documentation

## 2012-04-07 DIAGNOSIS — E785 Hyperlipidemia, unspecified: Secondary | ICD-10-CM | POA: Insufficient documentation

## 2012-04-07 DIAGNOSIS — R109 Unspecified abdominal pain: Secondary | ICD-10-CM

## 2012-04-07 HISTORY — DX: Bariatric surgery status: Z98.84

## 2012-04-07 LAB — URINE MICROSCOPIC-ADD ON

## 2012-04-07 LAB — URINALYSIS, ROUTINE W REFLEX MICROSCOPIC
Glucose, UA: NEGATIVE mg/dL
Nitrite: NEGATIVE
Specific Gravity, Urine: 1.03 (ref 1.005–1.030)
pH: 7 (ref 5.0–8.0)

## 2012-04-07 LAB — COMPREHENSIVE METABOLIC PANEL
ALT: 14 U/L (ref 0–35)
Alkaline Phosphatase: 63 U/L (ref 39–117)
BUN: 11 mg/dL (ref 6–23)
Chloride: 104 mEq/L (ref 96–112)
GFR calc Af Amer: 76 mL/min — ABNORMAL LOW (ref >90)
Glucose, Bld: 83 mg/dL (ref 70–99)
Potassium: 3.5 mEq/L (ref 3.5–5.1)
Sodium: 137 mEq/L (ref 135–145)
Total Bilirubin: 0.2 mg/dL — ABNORMAL LOW (ref 0.3–1.2)
Total Protein: 6.2 g/dL (ref 6.0–8.3)

## 2012-04-07 LAB — CBC
HCT: 33.9 % — ABNORMAL LOW (ref 36.0–46.0)
Hemoglobin: 11.9 g/dL — ABNORMAL LOW (ref 12.0–15.0)
MCHC: 35.1 g/dL (ref 30.0–36.0)
RBC: 3.87 MIL/uL (ref 3.87–5.11)
WBC: 9.2 10*3/uL (ref 4.0–10.5)

## 2012-04-07 LAB — PREGNANCY, URINE: Preg Test, Ur: NEGATIVE

## 2012-04-07 MED ORDER — ONDANSETRON HCL 4 MG/2ML IJ SOLN
4.0000 mg | Freq: Once | INTRAMUSCULAR | Status: AC
  Administered 2012-04-07: 4 mg via INTRAVENOUS
  Filled 2012-04-07: qty 2

## 2012-04-07 MED ORDER — OXYCODONE-ACETAMINOPHEN 5-325 MG/5ML PO SOLN: 5.0000 mL | ORAL | Status: DC | PRN

## 2012-04-07 MED ORDER — ONDANSETRON 4 MG PO TBDP: 4.0000 mg | ORAL_TABLET | Freq: Three times a day (TID) | ORAL | Status: AC | PRN

## 2012-04-07 MED ORDER — HYDROMORPHONE HCL PF 1 MG/ML IJ SOLN
1.0000 mg | Freq: Once | INTRAMUSCULAR | Status: AC
  Administered 2012-04-07: 1 mg via INTRAVENOUS
  Filled 2012-04-07: qty 1

## 2012-04-07 MED ORDER — SODIUM CHLORIDE 0.9 % IV BOLUS (SEPSIS)
1000.0000 mL | Freq: Once | INTRAVENOUS | Status: AC
  Administered 2012-04-07: 1000 mL via INTRAVENOUS

## 2012-04-07 NOTE — ED Provider Notes (Signed)
History     CSN: [id number]  Arrival date & time 04/07/12  1939   First MD Initiated Contact with Patient 04/07/12 2009      Chief Complaint  Patient presents with  . Flank Pain    (Consider location/radiation/quality/duration/timing/severity/associated sxs/prior treatment) The history is provided by the patient, the spouse and medical records.   [name] is a 36 y.o. female presents to the emergency department complaining of flank pain and LLQ abd pain.  The onset of the symptoms was  gradual starting 3 days ago.  The patient has associated nausea, vomiting, hematuria.  The symptoms have been  intermittent, gradually worsened.  movement makes the symptoms worse and nothing makes symptoms better.  The patient denies fever, chills, headache, chest pain, shortness of breath, dizziness, lightheadedness.  She was seen by Jennette Dubin, her urologist 3 weeks ago and had a CT at that time that showed a number of small non-obstructing stones in the L kidney.  She describes the pain as colicky and cramp like. She rates the pain at a 10/10 when it happens, but 5/10 otherwise.  She has tried tylenol, aleve, and vicoden without relief.  She has frequency and urgency, but no dysuria.  PCP prescribed Macrobid QD for UTI prophylaxis.     The patient has medical history significant for:  Past Medical History  Diagnosis Date  . Hypertension   . Hyperlipidemia   . Headache, migraine   . Wears glasses   . Incontinence   . Headaches, cluster   . GERD (gastroesophageal reflux disease)   . Plantar fasciitis   . Lumbago   . Bruises easily   . Sinus problem   . Wears glasses     reading glasses only  . Arthritis     foot and knee  . Sore throat   . 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 Hypertension   . Hyperlipidemia   . Headache, migraine   . Wears glasses   . Incontinence   . Headaches, cluster   . GERD  (gastroesophageal reflux disease)   . Plantar fasciitis   . Lumbago   . Bruises easily   . Sinus problem   . Wears glasses     reading glasses only  . Arthritis     foot and knee  . Sore throat   . Hx of gastric bypass may 2012    Roux-en-Y gastric bypass    Past Surgical History  Procedure Date  . Plantar fasciectomy Right in 2006, Left in 2010  . Gastric bypass 03/14/11  . Cholecystectomy 07/2004  . Endometrial ablation 09/2003  . Tubal ligation 1999    No family history on file.  History  Substance Use Topics  . Smoking status: Never Smoker   . Smokeless tobacco: Never Used  . Alcohol Use: No    OB History    Grav Para Term Preterm Abortions TAB SAB Ect Mult Living                  Review of Systems  Constitutional: Negative for fever, diaphoresis, appetite change, fatigue and unexpected weight change.  HENT: Negative for mouth sores and trouble swallowing.   Respiratory: Negative for cough, chest tightness, shortness of breath, wheezing and stridor.   Cardiovascular: Negative for chest pain and palpitations.  Gastrointestinal: Positive for nausea, vomiting and abdominal pain. Negative for diarrhea, constipation, blood  in stool, abdominal distention and rectal pain.  Genitourinary: Positive for urgency, frequency, hematuria and flank pain. Negative for dysuria, vaginal bleeding, vaginal discharge and difficulty urinating.  Musculoskeletal: Positive for back pain (L flank).  Skin: Negative for rash.  Neurological: Negative for weakness.  Hematological: Negative for adenopathy.  Psychiatric/Behavioral: Negative for confusion.  All other systems reviewed and are negative.    Allergies  Morphine and related and Pentazocine lactate  Home Medications   Current Outpatient Rx  Name Route Sig Dispense Refill  . AMPHETAMINE-DEXTROAMPHETAMINE 20 MG PO TABS Oral Take 20 mg by mouth 4 (four) times daily.     Marland Kitchen BIOTIN 1000 MCG PO TABS Oral Take 1,000 mcg by mouth 2  (two) times daily.    Marland Kitchen CALCIUM CITRATE 950 MG PO TABS Oral Take 6 tablets by mouth daily.     . B-12 5000 MCG SL SUBL Sublingual Place 5,000 mcg under the tongue 2 (two) times daily.    Marland Kitchen VITAMIN B-12 IJ Injection Inject 1 Syringe as directed every 30 (thirty) days.    . CYCLOBENZAPRINE HCL 5 MG PO TABS Oral Take 5 mg by mouth 3 (three) times daily as needed.    Marland Kitchen FLEXERIL PO Oral Take 1 tablet by mouth at bedtime.    Marland Kitchen DIMENHYDRINATE 50 MG PO TABS Oral Take 50 mg by mouth every 8 (eight) hours as needed. For nausea    . FERROUS SULFATE 325 (65 FE) MG PO TBEC Oral Take 325 mg by mouth daily.    Marland Kitchen HYDROCODONE-ACETAMINOPHEN 5-500 MG PO TABS Oral Take 1 tablet by mouth every 6 (six) hours as needed. For pain    . MULTI-VITAMIN/MINERALS PO TABS Oral Take 2 tablets by mouth daily.      Marland Kitchen NAPROXEN SODIUM 220 MG PO TABS Oral Take 220 mg by mouth 2 (two) times daily with a meal.    . NITROFURANTOIN MONOHYD MACRO 100 MG PO CAPS Oral Take 100 mg by mouth daily.    Marland Kitchen RIZATRIPTAN BENZOATE 10 MG PO TABS Oral Take 10 mg by mouth as needed. May repeat in 2 hours if needed for migraine    . ZOLPIDEM TARTRATE 10 MG PO TABS Oral Take 10 mg by mouth at bedtime.     Marland Kitchen ONDANSETRON 4 MG PO TBDP Oral Take 1 tablet (4 mg total) by mouth every 8 (eight) hours as needed for nausea. 10 tablet 0  . OXYCODONE-ACETAMINOPHEN 5-325 MG/5ML PO SOLN Oral Take 5 mLs by mouth every 4 (four) hours as needed for pain. 250 mL 0    BP 111/72  Pulse 99  Temp 97.9 F (36.6 C) (Oral)  Resp 16  Ht 5\' 4"  (1.626 m)  Wt 123 lb (55.792 kg)  BMI 21.11 kg/m2  SpO2 99%  Physical Exam  Nursing note and vitals reviewed. Constitutional: She appears well-developed and well-nourished. No distress.  HENT:  Head: Normocephalic and atraumatic.  Mouth/Throat: Oropharynx is clear and moist. No oropharyngeal exudate.  Eyes: Conjunctivae are normal. No scleral icterus.  Neck: Normal range of motion. Neck supple.  Cardiovascular: Normal rate,  regular rhythm, normal heart sounds and intact distal pulses.  Exam reveals no gallop and no friction rub.   No murmur heard. Pulmonary/Chest: Effort normal and breath sounds normal. No respiratory distress. She has no wheezes.  Abdominal: Soft. Bowel sounds are normal. She exhibits no mass. There is no hepatosplenomegaly. There is tenderness in the left lower quadrant. There is no rebound, no guarding, no CVA tenderness, no  tenderness at McBurney's point and negative Murphy's sign.  Musculoskeletal: Normal range of motion. She exhibits no edema.  Lymphadenopathy:    She has no cervical adenopathy.  Neurological: She is alert.       Speech is clear and goal oriented Moves extremities without ataxia  Skin: Skin is warm and dry. She is not diaphoretic.  Psychiatric: She has a normal mood and affect.    ED Course  Procedures (including critical care time)  Labs Reviewed  URINALYSIS, ROUTINE W REFLEX MICROSCOPIC - Abnormal; Notable for the following:    APPearance CLOUDY (*)     Hgb urine dipstick SMALL (*)     Leukocytes, UA SMALL (*)     All other components within normal limits  CBC - Abnormal; Notable for the following:    Hemoglobin 11.9 (*)     HCT 33.9 (*)     All other components within normal limits  COMPREHENSIVE METABOLIC PANEL - Abnormal; Notable for the following:    Total Bilirubin 0.2 (*)     GFR calc non Af Amer 66 (*)     GFR calc Af Amer 76 (*)     All other components within normal limits  URINE MICROSCOPIC-ADD ON - Abnormal; Notable for the following:    Squamous Epithelial / LPF MANY (*)     Bacteria, UA FEW (*)     Crystals CA OXALATE CRYSTALS (*)     All other components within normal limits  PREGNANCY, URINE   No results found. Results for orders placed during the hospital encounter of 04/07/12  URINALYSIS, ROUTINE W REFLEX MICROSCOPIC      Component Value Range   Color, Urine YELLOW  YELLOW   APPearance CLOUDY (*) CLEAR   Specific Gravity, Urine 1.030   1.005 - 1.030   pH 7.0  5.0 - 8.0   Glucose, UA NEGATIVE  NEGATIVE mg/dL   Hgb urine dipstick SMALL (*) NEGATIVE   Bilirubin Urine NEGATIVE  NEGATIVE   Ketones, ur NEGATIVE  NEGATIVE mg/dL   Protein, ur NEGATIVE  NEGATIVE mg/dL   Urobilinogen, UA 1.0  0.0 - 1.0 mg/dL   Nitrite NEGATIVE  NEGATIVE   Leukocytes, UA SMALL (*) NEGATIVE  CBC      Component Value Range   WBC 9.2  4.0 - 10.5 K/uL   RBC 3.87  3.87 - 5.11 MIL/uL   Hemoglobin 11.9 (*) 12.0 - 15.0 g/dL   HCT 16.1 (*) 09.6 - 04.5 %   MCV 87.6  78.0 - 100.0 fL   MCH 30.7  26.0 - 34.0 pg   MCHC 35.1  30.0 - 36.0 g/dL   RDW 40.9  81.1 - 91.4 %   Platelets 214  150 - 400 K/uL  COMPREHENSIVE METABOLIC PANEL      Component Value Range   Sodium 137  135 - 145 mEq/L   Potassium 3.5  3.5 - 5.1 mEq/L   Chloride 104  96 - 112 mEq/L   CO2 26  19 - 32 mEq/L   Glucose, Bld 83  70 - 99 mg/dL   BUN 11  6 - 23 mg/dL   Creatinine, Ser 7.82  0.50 - 1.10 mg/dL   Calcium 8.8  8.4 - 95.6 mg/dL   Total Protein 6.2  6.0 - 8.3 g/dL   Albumin 3.5  3.5 - 5.2 g/dL   AST 13  0 - 37 U/L   ALT 14  0 - 35 U/L   Alkaline Phosphatase 63  39 -  117 U/L   Total Bilirubin 0.2 (*) 0.3 - 1.2 mg/dL   GFR calc non Af Amer 66 (*) >90 mL/min   GFR calc Af Amer 76 (*) >90 mL/min  URINE MICROSCOPIC-ADD ON      Component Value Range   Squamous Epithelial / LPF MANY (*) RARE   WBC, UA 3-6  <3 WBC/hpf   RBC / HPF 3-6  <3 RBC/hpf   Bacteria, UA FEW (*) RARE   Crystals CA OXALATE CRYSTALS (*) NEGATIVE   Urine-Other MUCOUS PRESENT    PREGNANCY, URINE      Component Value Range   Preg Test, Ur NEGATIVE  NEGATIVE   No results found.    1. Recurrent nephrolithiasis   2. Back pain   3. Abdominal pain       MDM  [name] presents with flank pain, frequency and urgency.  She is non-toxic, non-septic appearing.  CT 3 weeks ago showed numerous small, non-obstructing stones in the L kidney.  She has classic renal colic and with a recent CT I don't  believe the extra radiation will be helpful.  I am doubtful that she has an obstructing stone. Surgical Hx of gastric bypass, but I am doubtful that this is a bowel obstruction.  After dilaudid administration pt had complete resolution of pain for a few hours. Pain is beginning to return at this time and I will redose dilaudid.  Her vitals are stable and she is amenable to going home.  I will have her follow-up with urology and discuss a refill/continuation of the macrobid with her PCP.  Due to gastric bypass I will give Zofran ODT and Roxicet liquid.    1. Medications: roxicet, zofran 2. Treatment: rest, drink plenty of fluids, take medications as prescribed 3. Follow Up: with urology when able, return to ER if symptoms worsen or pain becomes uncontrollable.          Dahlia Client Marcelline Temkin, PA-C 04/07/12 2344

## 2012-04-07 NOTE — ED Notes (Signed)
Pt alert, arrives from home, c/o left flank pain, onset was several months ago, resp even unlabored, skin pwd, per pt, hx of kidney stones, non obstructing

## 2012-04-07 NOTE — ED Provider Notes (Signed)
Medical screening examination/treatment/procedure(s) were performed by non-physician practitioner and as supervising physician I was immediately available for consultation/collaboration.   Richardean Canal, MD 04/07/12 [phone]

## 2012-05-13 ENCOUNTER — Emergency Department (HOSPITAL_COMMUNITY): Payer: Private Health Insurance - Indemnity

## 2012-05-13 ENCOUNTER — Emergency Department (HOSPITAL_COMMUNITY)
Admission: EM | Admit: 2012-05-13 | Discharge: 2012-05-13 | Disposition: A | Discharge status: Home / Self Care | Payer: Private Health Insurance - Indemnity | Attending: Emergency Medicine | Admitting: Emergency Medicine

## 2012-05-13 ENCOUNTER — Encounter (HOSPITAL_COMMUNITY): Payer: Self-pay | Admitting: *Deleted

## 2012-05-13 DIAGNOSIS — Z79899 Other long term (current) drug therapy: Secondary | ICD-10-CM | POA: Insufficient documentation

## 2012-05-13 DIAGNOSIS — E785 Hyperlipidemia, unspecified: Secondary | ICD-10-CM | POA: Insufficient documentation

## 2012-05-13 DIAGNOSIS — I10 Essential (primary) hypertension: Secondary | ICD-10-CM | POA: Insufficient documentation

## 2012-05-13 DIAGNOSIS — R109 Unspecified abdominal pain: Secondary | ICD-10-CM | POA: Insufficient documentation

## 2012-05-13 DIAGNOSIS — Z8739 Personal history of other diseases of the musculoskeletal system and connective tissue: Secondary | ICD-10-CM | POA: Insufficient documentation

## 2012-05-13 DIAGNOSIS — N200 Calculus of kidney: Secondary | ICD-10-CM | POA: Insufficient documentation

## 2012-05-13 DIAGNOSIS — K219 Gastro-esophageal reflux disease without esophagitis: Secondary | ICD-10-CM | POA: Insufficient documentation

## 2012-05-13 LAB — COMPREHENSIVE METABOLIC PANEL
ALT: 12 U/L (ref 0–35)
AST: 12 U/L (ref 0–37)
Alkaline Phosphatase: 55 U/L (ref 39–117)
CO2: 26 mEq/L (ref 19–32)
Chloride: 103 mEq/L (ref 96–112)
GFR calc non Af Amer: 83 mL/min — ABNORMAL LOW (ref >90)
Sodium: 136 mEq/L (ref 135–145)
Total Bilirubin: 0.3 mg/dL (ref 0.3–1.2)

## 2012-05-13 LAB — CBC WITH DIFFERENTIAL/PLATELET
Basophils Absolute: 0 10*3/uL (ref 0.0–0.1)
HCT: 36.3 % (ref 36.0–46.0)
Lymphocytes Relative: 34 % (ref 12–46)
Lymphs Abs: 2.8 10*3/uL (ref 0.7–4.0)
Neutro Abs: 4.5 10*3/uL (ref 1.7–7.7)
Platelets: 223 10*3/uL (ref 150–400)
RBC: 3.98 MIL/uL (ref 3.87–5.11)
RDW: 13.8 % (ref 11.5–15.5)
WBC: 8.2 10*3/uL (ref 4.0–10.5)

## 2012-05-13 LAB — URINALYSIS, MICROSCOPIC ONLY
Glucose, UA: NEGATIVE mg/dL
Ketones, ur: NEGATIVE mg/dL
Protein, ur: NEGATIVE mg/dL

## 2012-05-13 MED ORDER — HYDROMORPHONE HCL PF 1 MG/ML IJ SOLN
1.0000 mg | Freq: Once | INTRAMUSCULAR | Status: AC
  Administered 2012-05-13: 1 mg via INTRAVENOUS
  Filled 2012-05-13: qty 1

## 2012-05-13 MED ORDER — CIPROFLOXACIN IN D5W 400 MG/200ML IV SOLN
400.0000 mg | Freq: Once | INTRAVENOUS | Status: AC
  Administered 2012-05-13: 400 mg via INTRAVENOUS
  Filled 2012-05-13: qty 200

## 2012-05-13 MED ORDER — ONDANSETRON HCL 4 MG/2ML IJ SOLN
4.0000 mg | Freq: Once | INTRAMUSCULAR | Status: AC
  Administered 2012-05-13: 4 mg via INTRAVENOUS
  Filled 2012-05-13: qty 2

## 2012-05-13 MED ORDER — HYDROCODONE-ACETAMINOPHEN 5-500 MG PO TABS: 1.0000 | ORAL_TABLET | Freq: Four times a day (QID) | ORAL | Status: DC | PRN

## 2012-05-13 MED ORDER — CIPROFLOXACIN HCL 500 MG PO TABS: 500.0000 mg | ORAL_TABLET | Freq: Two times a day (BID) | ORAL | Status: AC

## 2012-05-13 MED ORDER — OXYCODONE-ACETAMINOPHEN 5-325 MG/5ML PO SOLN: 5.0000 mL | ORAL | Status: DC | PRN

## 2012-05-13 NOTE — ED Provider Notes (Signed)
History     CSN: [id number]  Arrival date & time 05/13/12  4098   First MD Initiated Contact with Patient 05/13/12 [phone]      Chief Complaint  Patient presents with  . Flank Pain  . Nausea  . Emesis    HPI The patient presents with pain in her right abdomen and right flank.  Symptoms began subacutely hours prior to arrival.  Since onset the pain has been persistent, sharp, incapacitating.  The pain is nonradiating.  She notes associated nausea, vomiting, no diarrhea.  She denies fevers or chills.  No clear alleviating or exacerbating factors. Past Medical History  Diagnosis Date  . Hypertension   . Hyperlipidemia   . Headache, migraine   . Wears glasses   . Incontinence   . Headaches, cluster   . GERD (gastroesophageal reflux disease)   . Plantar fasciitis   . Lumbago   . Bruises easily   . Sinus problem   . Wears glasses     reading glasses only  . Arthritis     foot and knee  . Sore throat   . Hx of gastric bypass may 2012    Roux-en-Y gastric bypass    Past Surgical History  Procedure Date  . Plantar fasciectomy Right in 2006, Left in 2010  . Gastric bypass 03/14/11  . Cholecystectomy 07/2004  . Endometrial ablation 09/2003  . Tubal ligation 1999    No family history on file.  History  Substance Use Topics  . Smoking status: Never Smoker   . Smokeless tobacco: Never Used  . Alcohol Use: No    OB History    Grav Para Term Preterm Abortions TAB SAB Ect Mult Living                  Review of Systems  Constitutional:       HPI  HENT:       HPI otherwise negative  Eyes: Negative.   Respiratory:       HPI, otherwise negative  Cardiovascular:       HPI, otherwise nmegative  Gastrointestinal: Positive for vomiting. Negative for diarrhea.  Genitourinary:       HPI, otherwise negative  Musculoskeletal:       HPI, otherwise negative  Skin: Negative.   Neurological: Negative for syncope.    Allergies  Morphine and related and Pentazocine  lactate  Home Medications   Current Outpatient Rx  Name Route Sig Dispense Refill  . AMPHETAMINE-DEXTROAMPHETAMINE 20 MG PO TABS Oral Take 20 mg by mouth 4 (four) times daily.     Marland Kitchen BIOTIN 1000 MCG PO TABS Oral Take 1,000 mcg by mouth 2 (two) times daily.    Marland Kitchen CALCIUM CITRATE 950 MG PO TABS Oral Take 6 tablets by mouth daily.     . B-12 5000 MCG SL SUBL Sublingual Place 5,000 mcg under the tongue 2 (two) times daily.    Marland Kitchen VITAMIN B-12 IJ Injection Inject 1 Syringe as directed every 30 (thirty) days.    . CYCLOBENZAPRINE HCL 5 MG PO TABS Oral Take 5 mg by mouth 3 (three) times daily as needed.    Marland Kitchen FLEXERIL PO Oral Take 1 tablet by mouth at bedtime.    Marland Kitchen DIMENHYDRINATE 50 MG PO TABS Oral Take 50 mg by mouth every 8 (eight) hours as needed. For nausea    . FERROUS SULFATE 325 (65 FE) MG PO TBEC Oral Take 325 mg by mouth daily.    Marland Kitchen HYDROCODONE-ACETAMINOPHEN 5-500  MG PO TABS Oral Take 1 tablet by mouth every 6 (six) hours as needed. For pain    . MULTI-VITAMIN/MINERALS PO TABS Oral Take 2 tablets by mouth daily.      Marland Kitchen NAPROXEN SODIUM 220 MG PO TABS Oral Take 220 mg by mouth 2 (two) times daily with a meal.    . NITROFURANTOIN MONOHYD MACRO 100 MG PO CAPS Oral Take 100 mg by mouth daily.    . OXYCODONE-ACETAMINOPHEN 5-325 MG/5ML PO SOLN Oral Take 5 mLs by mouth every 4 (four) hours as needed for pain. 250 mL 0  . RIZATRIPTAN BENZOATE 10 MG PO TABS Oral Take 10 mg by mouth as needed. May repeat in 2 hours if needed for migraine    . ZOLPIDEM TARTRATE 10 MG PO TABS Oral Take 10 mg by mouth at bedtime.       BP 137/62  Pulse 82  Temp 98.3 F (36.8 C) (Oral)  Resp 20  SpO2 99%  Physical Exam  Nursing note and vitals reviewed. Constitutional: She is oriented to person, place, and time. She appears well-developed and well-nourished. No distress.  HENT:  Head: Normocephalic and atraumatic.  Eyes: Conjunctivae normal and EOM are normal.  Cardiovascular: Normal rate and regular rhythm.    Pulmonary/Chest: Effort normal and breath sounds normal. No stridor. No respiratory distress.  Abdominal: She exhibits no distension. There is no hepatosplenomegaly. There is tenderness in the right lower quadrant. There is CVA tenderness. There is no rigidity and no guarding.  Musculoskeletal: She exhibits no edema.  Neurological: She is alert and oriented to person, place, and time. No cranial nerve deficit.  Skin: Skin is warm and dry.  Psychiatric: She has a normal mood and affect.    ED Course  Procedures (including critical care time)  Labs Reviewed  COMPREHENSIVE METABOLIC PANEL - Abnormal; Notable for the following:    Potassium 3.2 (*)     Albumin 3.3 (*)     GFR calc non Af Amer 83 (*)     All other components within normal limits  URINALYSIS, WITH MICROSCOPIC - Abnormal; Notable for the following:    APPearance TURBID (*)     Hgb urine dipstick LARGE (*)     Bilirubin Urine SMALL (*)     Leukocytes, UA MODERATE (*)     Bacteria, UA MANY (*)     Squamous Epithelial / LPF FEW (*)     Crystals CA OXALATE CRYSTALS (*)     All other components within normal limits  CBC WITH DIFFERENTIAL  LIPASE, BLOOD  PREGNANCY, URINE   Ct Abdomen Pelvis Wo Contrast  05/13/2012  *RADIOLOGY REPORT*  Clinical Data: Acute onset right flank pain earlier today.  History of urinary tract calculi.  Surgical history includes gastric bypass procedure, cholecystectomy, and tubal ligation.  CT ABDOMEN AND PELVIS WITHOUT CONTRAST  Technique:  Multidetector CT imaging of the abdomen and pelvis was performed following the standard protocol without intravenous contrast.  Comparison: CT abdomen and pelvis 03/12/2012 [facility], 10/21/2007 [place].  Findings: Approximate 4 mm calculus at the right ureterovesical junction accounting for moderate right hydronephrosis, right renal edema, and right perinephric edema.  Numerous bilateral nonobstructing renal calculi.  No left hydronephrosis.   Within the limits of the unenhanced technique, no focal parenchymal abnormality involving either kidney.  Normal unenhanced appearance of the liver, spleen, pancreas, and adrenal glands.  Gallbladder surgically absent.  No biliary ductal dilation.  No visible aorto-iliofemoral atherosclerosis.  Normal sized retroperitoneal  lymph nodes as noted previously; no significant lymphadenopathy.  Post-surgical changes related to gastric bypass procedure.  No hiatal hernia.  Normal-appearing small bowel.  Normal-appearing colon.  Normal appendix in the right mid and lower pelvis.  No ascites.  Uterus and ovaries unremarkable for age.  Urinary bladder decompressed and unremarkable.  Phleboliths low in both sides of the pelvis.  Visualized lung bases clear.  Heart size normal.  Bone window images unremarkable.  IMPRESSION:  1.  Obstructing approximate 4 mm calculus at the right UVJ. 2.  Numerous non-obstructing bilateral renal calculi. 3.  Post-surgical changes related to gastric bypass procedure without complicating features.   Original Report Authenticated By: Arnell Sieving, M.D.      No diagnosis found.   10:24 AM The patient is resting, seemingly much more comfortably, than on my initial evaluation.  She states that while being positioned for her CAT scan she felt a discernible motion her abdomen, with significant relief from pain. MDM  This patient with history of kidney stones as well as gastric bypass now presents with the severe pain in her right flank and right side.  Given the patient's exam findings, her history there suspicion for nephrolithiasis, though acute GI pathology including obstruction is considered.  The patient is afebrile, but given the presence of leukocytes in her urine she will see a short course of antibiotics, as well as analgesics for the kidney stone was demonstrated on her CAT scan.  The absence of acute GI findings his reassuring.  The patient was substantially improved, with  analgesics, and appropriate for discharged with urology followup.     Gerhard Munch, MD 05/13/12 1026

## 2012-05-13 NOTE — ED Notes (Signed)
Pt reports while at CT scan legs were extended, felt "something move" in RLQ down to pelvic area - describes as "marble" rolling down. Pt states pain has decreased since

## 2012-05-13 NOTE — ED Notes (Signed)
Pt reports onset of right flank pain with radiation into right lower abdomen and groin that is intermittent starting this AM. Pt reports history of kidney stones, but thinks this pain is different. Pt reports she is unable to get comfortable. Pt denies hematuria and is able to void without issue.  Pt also reports nausea and vomiting. Pt denies taking medication today.

## 2012-11-27 ENCOUNTER — Other Ambulatory Visit: Payer: Self-pay | Admitting: Obstetrics and Gynecology

## 2012-11-27 DIAGNOSIS — N63 Unspecified lump in unspecified breast: Secondary | ICD-10-CM

## 2012-12-07 ENCOUNTER — Ambulatory Visit
Admission: RE | Admit: 2012-12-07 | Discharge: 2012-12-07 | Disposition: A | Discharge status: Home / Self Care | Payer: Private Health Insurance - Indemnity | Source: Ambulatory Visit | Attending: Obstetrics and Gynecology | Admitting: Obstetrics and Gynecology

## 2012-12-07 DIAGNOSIS — N63 Unspecified lump in unspecified breast: Secondary | ICD-10-CM

## 2013-01-25 ENCOUNTER — Encounter (INDEPENDENT_AMBULATORY_CARE_PROVIDER_SITE_OTHER): Payer: Self-pay | Admitting: Surgery

## 2013-12-27 ENCOUNTER — Other Ambulatory Visit: Payer: Self-pay | Admitting: Orthopedic Surgery

## 2014-01-16 ENCOUNTER — Encounter (HOSPITAL_BASED_OUTPATIENT_CLINIC_OR_DEPARTMENT_OTHER): Payer: Self-pay | Admitting: *Deleted

## 2014-01-17 ENCOUNTER — Encounter (HOSPITAL_BASED_OUTPATIENT_CLINIC_OR_DEPARTMENT_OTHER): Payer: Self-pay | Admitting: *Deleted

## 2014-01-17 NOTE — Progress Notes (Signed)
Pt had her rt ctr done 6/12-no labs needed

## 2014-01-22 ENCOUNTER — Encounter (HOSPITAL_BASED_OUTPATIENT_CLINIC_OR_DEPARTMENT_OTHER): Payer: Self-pay | Admitting: Certified Registered"

## 2014-01-22 ENCOUNTER — Ambulatory Visit (HOSPITAL_BASED_OUTPATIENT_CLINIC_OR_DEPARTMENT_OTHER)
Admission: RE | Admit: 2014-01-22 | Discharge: 2014-01-22 | Disposition: A | Discharge status: Home / Self Care | Payer: Private Health Insurance - Indemnity | Source: Ambulatory Visit | Attending: Orthopedic Surgery | Admitting: Orthopedic Surgery

## 2014-01-22 ENCOUNTER — Encounter (HOSPITAL_BASED_OUTPATIENT_CLINIC_OR_DEPARTMENT_OTHER)
Admission: RE | Disposition: A | Discharge status: Home / Self Care | Payer: Self-pay | Source: Ambulatory Visit | Attending: Orthopedic Surgery

## 2014-01-22 ENCOUNTER — Encounter (HOSPITAL_BASED_OUTPATIENT_CLINIC_OR_DEPARTMENT_OTHER): Payer: Private Health Insurance - Indemnity | Admitting: Anesthesiology

## 2014-01-22 ENCOUNTER — Ambulatory Visit (HOSPITAL_BASED_OUTPATIENT_CLINIC_OR_DEPARTMENT_OTHER): Payer: Private Health Insurance - Indemnity | Admitting: Anesthesiology

## 2014-01-22 DIAGNOSIS — Z9884 Bariatric surgery status: Secondary | ICD-10-CM | POA: Insufficient documentation

## 2014-01-22 DIAGNOSIS — G5600 Carpal tunnel syndrome, unspecified upper limb: Secondary | ICD-10-CM | POA: Insufficient documentation

## 2014-01-22 DIAGNOSIS — I10 Essential (primary) hypertension: Secondary | ICD-10-CM | POA: Insufficient documentation

## 2014-01-22 DIAGNOSIS — K219 Gastro-esophageal reflux disease without esophagitis: Secondary | ICD-10-CM | POA: Insufficient documentation

## 2014-01-22 DIAGNOSIS — M129 Arthropathy, unspecified: Secondary | ICD-10-CM | POA: Insufficient documentation

## 2014-01-22 HISTORY — PX: CARPAL TUNNEL RELEASE: SHX101

## 2014-01-22 LAB — POCT HEMOGLOBIN-HEMACUE: Hemoglobin: 11.7 g/dL — ABNORMAL LOW (ref 12.0–15.0)

## 2014-01-22 SURGERY — CARPAL TUNNEL RELEASE
Anesthesia: General | Site: Wrist | Laterality: Left

## 2014-01-22 MED ORDER — BUPIVACAINE HCL (PF) 0.25 % IJ SOLN
INTRAMUSCULAR | Status: AC
  Filled 2014-01-22: qty 30

## 2014-01-22 MED ORDER — CEFAZOLIN SODIUM-DEXTROSE 2-3 GM-% IV SOLR: 2.0000 g | INTRAVENOUS | Status: DC

## 2014-01-22 MED ORDER — FENTANYL CITRATE 0.05 MG/ML IJ SOLN
INTRAMUSCULAR | Status: AC
  Filled 2014-01-22: qty 4

## 2014-01-22 MED ORDER — TRAMADOL HCL 50 MG PO TABS
ORAL_TABLET | ORAL | Status: AC
  Filled 2014-01-22: qty 1

## 2014-01-22 MED ORDER — ONDANSETRON HCL 4 MG/2ML IJ SOLN
INTRAMUSCULAR | Status: DC | PRN
  Administered 2014-01-22: 4 mg via INTRAVENOUS

## 2014-01-22 MED ORDER — FENTANYL CITRATE 0.05 MG/ML IJ SOLN: 50.0000 ug | INTRAMUSCULAR | Status: DC | PRN

## 2014-01-22 MED ORDER — TRAMADOL HCL 50 MG PO TABS: 50.0000 mg | ORAL_TABLET | Freq: Four times a day (QID) | ORAL | Status: DC | PRN

## 2014-01-22 MED ORDER — VANCOMYCIN HCL 1000 MG IV SOLR
500.0000 mg | INTRAVENOUS | Status: DC | PRN
  Administered 2014-01-22: 500 mg via INTRAVENOUS

## 2014-01-22 MED ORDER — LIDOCAINE HCL (PF) 1 % IJ SOLN
INTRAMUSCULAR | Status: AC
  Filled 2014-01-22: qty 5

## 2014-01-22 MED ORDER — VANCOMYCIN HCL IN DEXTROSE 500-5 MG/100ML-% IV SOLN
INTRAVENOUS | Status: AC
  Filled 2014-01-22: qty 100

## 2014-01-22 MED ORDER — FENTANYL CITRATE 0.05 MG/ML IJ SOLN
INTRAMUSCULAR | Status: DC | PRN
  Administered 2014-01-22 (×2): 50 ug via INTRAVENOUS

## 2014-01-22 MED ORDER — FENTANYL CITRATE 0.05 MG/ML IJ SOLN
25.0000 ug | INTRAMUSCULAR | Status: DC | PRN
  Administered 2014-01-22 (×2): 25 ug via INTRAVENOUS

## 2014-01-22 MED ORDER — MIDAZOLAM HCL 5 MG/5ML IJ SOLN
INTRAMUSCULAR | Status: DC | PRN
  Administered 2014-01-22: 1 mg via INTRAVENOUS

## 2014-01-22 MED ORDER — 0.9 % SODIUM CHLORIDE (POUR BTL) OPTIME
TOPICAL | Status: DC | PRN
  Administered 2014-01-22: 200 mL

## 2014-01-22 MED ORDER — LIDOCAINE HCL (CARDIAC) 20 MG/ML IV SOLN
INTRAVENOUS | Status: DC | PRN
  Administered 2014-01-22: 75 mg via INTRAVENOUS

## 2014-01-22 MED ORDER — TRAMADOL HCL 50 MG PO TABS
50.0000 mg | ORAL_TABLET | Freq: Once | ORAL | Status: AC | PRN
  Administered 2014-01-22: 50 mg via ORAL

## 2014-01-22 MED ORDER — OXYCODONE HCL 5 MG/5ML PO SOLN: 5.0000 mg | Freq: Once | ORAL | Status: DC | PRN

## 2014-01-22 MED ORDER — CHLORHEXIDINE GLUCONATE 4 % EX LIQD: 60.0000 mL | Freq: Once | CUTANEOUS | Status: DC

## 2014-01-22 MED ORDER — DEXAMETHASONE SODIUM PHOSPHATE 10 MG/ML IJ SOLN
INTRAMUSCULAR | Status: DC | PRN
  Administered 2014-01-22: 10 mg via INTRAVENOUS

## 2014-01-22 MED ORDER — LACTATED RINGERS IV SOLN
INTRAVENOUS | Status: DC
  Administered 2014-01-22 (×2): via INTRAVENOUS

## 2014-01-22 MED ORDER — MIDAZOLAM HCL 2 MG/2ML IJ SOLN: 1.0000 mg | INTRAMUSCULAR | Status: DC | PRN

## 2014-01-22 MED ORDER — FENTANYL CITRATE 0.05 MG/ML IJ SOLN
INTRAMUSCULAR | Status: AC
  Filled 2014-01-22: qty 2

## 2014-01-22 MED ORDER — PROPOFOL 10 MG/ML IV BOLUS
INTRAVENOUS | Status: DC | PRN
  Administered 2014-01-22: 180 mg via INTRAVENOUS

## 2014-01-22 MED ORDER — OXYCODONE HCL 5 MG PO TABS: 5.0000 mg | ORAL_TABLET | Freq: Once | ORAL | Status: DC | PRN

## 2014-01-22 MED ORDER — BUPIVACAINE HCL (PF) 0.25 % IJ SOLN
INTRAMUSCULAR | Status: DC | PRN
  Administered 2014-01-22: 6 mL

## 2014-01-22 MED ORDER — MIDAZOLAM HCL 2 MG/2ML IJ SOLN
INTRAMUSCULAR | Status: AC
  Filled 2014-01-22: qty 2

## 2014-01-22 MED ORDER — ONDANSETRON HCL 4 MG/2ML IJ SOLN: 4.0000 mg | Freq: Four times a day (QID) | INTRAMUSCULAR | Status: DC | PRN

## 2014-01-22 SURGICAL SUPPLY — 36 items
BLADE SURG 15 STRL LF DISP TIS (BLADE) ×1 IMPLANT
BLADE SURG 15 STRL SS (BLADE) ×3
BNDG CMPR 9X4 STRL LF SNTH (GAUZE/BANDAGES/DRESSINGS) ×1
BNDG COHESIVE 3X5 TAN STRL LF (GAUZE/BANDAGES/DRESSINGS) ×3 IMPLANT
BNDG ESMARK 4X9 LF (GAUZE/BANDAGES/DRESSINGS) ×2 IMPLANT
BNDG GAUZE ELAST 4 BULKY (GAUZE/BANDAGES/DRESSINGS) ×3 IMPLANT
CHLORAPREP W/TINT 26ML (MISCELLANEOUS) ×3 IMPLANT
CORDS BIPOLAR (ELECTRODE) ×3 IMPLANT
COVER MAYO STAND STRL (DRAPES) ×3 IMPLANT
COVER TABLE BACK 60X90 (DRAPES) ×3 IMPLANT
CUFF TOURNIQUET SINGLE 18IN (TOURNIQUET CUFF) ×3 IMPLANT
DRAPE EXTREMITY T 121X128X90 (DRAPE) ×3 IMPLANT
DRAPE SURG 17X23 STRL (DRAPES) ×3 IMPLANT
DRSG PAD ABDOMINAL 8X10 ST (GAUZE/BANDAGES/DRESSINGS) ×3 IMPLANT
GAUZE SPONGE 4X4 12PLY STRL (GAUZE/BANDAGES/DRESSINGS) ×3 IMPLANT
GAUZE XEROFORM 1X8 LF (GAUZE/BANDAGES/DRESSINGS) ×3 IMPLANT
GLOVE BIOGEL PI IND STRL 7.0 (GLOVE) IMPLANT
GLOVE BIOGEL PI IND STRL 8.5 (GLOVE) ×1 IMPLANT
GLOVE BIOGEL PI INDICATOR 7.0 (GLOVE) ×2
GLOVE BIOGEL PI INDICATOR 8.5 (GLOVE) ×2
GLOVE ECLIPSE 7.0 STRL STRAW (GLOVE) ×2 IMPLANT
GLOVE EXAM NITRILE MD LF STRL (GLOVE) ×2 IMPLANT
GLOVE SURG ORTHO 8.0 STRL STRW (GLOVE) ×3 IMPLANT
GOWN STRL REUS W/ TWL LRG LVL3 (GOWN DISPOSABLE) ×1 IMPLANT
GOWN STRL REUS W/TWL LRG LVL3 (GOWN DISPOSABLE) ×3
GOWN STRL REUS W/TWL XL LVL3 (GOWN DISPOSABLE) ×3 IMPLANT
NEEDLE 27GAX1X1/2 (NEEDLE) ×2 IMPLANT
NS IRRIG 1000ML POUR BTL (IV SOLUTION) ×3 IMPLANT
PACK BASIN DAY SURGERY FS (CUSTOM PROCEDURE TRAY) ×3 IMPLANT
STOCKINETTE 4X48 STRL (DRAPES) ×3 IMPLANT
SUT VICRYL 4-0 PS2 18IN ABS (SUTURE) IMPLANT
SUT VICRYL RAPIDE 4/0 PS 2 (SUTURE) ×3 IMPLANT
SYR BULB 3OZ (MISCELLANEOUS) ×3 IMPLANT
SYR CONTROL 10ML LL (SYRINGE) ×2 IMPLANT
TOWEL OR 17X24 6PK STRL BLUE (TOWEL DISPOSABLE) ×3 IMPLANT
UNDERPAD 30X30 INCONTINENT (UNDERPADS AND DIAPERS) ×3 IMPLANT

## 2014-01-22 NOTE — Brief Op Note (Signed)
01/22/2014  9:05 AM  PATIENT:  [name]  38 y.o. female  PRE-OPERATIVE DIAGNOSIS:  LEFT CARPAL TUNNEL SYNDROME  POST-OPERATIVE DIAGNOSIS:  LEFT CARPAL TUNNEL SYNDROME  PROCEDURE:  Procedure(s): LEFT CARPAL TUNNEL RELEASE (Left)  SURGEON:  Surgeon(s) and Role:    * Nicki Reaper, MD - Primary  PHYSICIAN ASSISTANT:   ASSISTANTS: none   ANESTHESIA:   local and general  EBL:  Total I/O In: 1000 [I.V.:1000] Out: -   BLOOD ADMINISTERED:none  DRAINS: none   LOCAL MEDICATIONS USED:  BUPIVICAINE   SPECIMEN:  No Specimen  DISPOSITION OF SPECIMEN:  N/A  COUNTS:  YES  TOURNIQUET:   Total Tourniquet Time Documented: Upper Arm (Left) - 10 minutes Total: Upper Arm (Left) - 10 minutes   DICTATION: .Other Dictation: Dictation Number [id number]  PLAN OF CARE: Discharge to home after PACU  PATIENT DISPOSITION:  PACU - hemodynamically stable.

## 2014-01-22 NOTE — Discharge Instructions (Addendum)
[facility] Instructions Hand Surgery  Wound Care: Keep your hand elevated above the level of your heart.  Do not allow it to dangle by your side.  Keep the dressing dry and do not remove it unless your doctor advises you to do so.  He will usually change it at the time of your post-op visit.  Moving your fingers is advised to stimulate circulation but will depend on the site of your surgery.  If you have a splint applied, your doctor will advise you regarding movement.  Activity: Do not drive or operate machinery today.  Rest today and then you may return to your normal activity and work as indicated by your physician.  Diet:  Drink liquids today or eat a light diet.  You may resume a regular diet tomorrow.    General expectations: Pain for two to three days. Fingers may become slightly swollen.  Call your doctor if any of the following occur: Severe pain not relieved by pain medication. Elevated temperature. Dressing soaked with blood. Inability to move fingers. White or bluish color to fingers.   Excuse from Work/School  Please excuse Jackline D Okubo from work/school beginning 01/23/2014 for 1day. She may then return to work/school with the following restrictions: one handed    Post Anesthesia Home Care Instructions  Activity: Get plenty of rest for the remainder of the day. A responsible adult should stay with you for 24 hours following the procedure.  For the next 24 hours, DO NOT: -Drive a car -Advertising copywriter -Drink alcoholic beverages -Take any medication unless instructed by your physician -Make any legal decisions or sign important papers.  Meals: Start with liquid foods such as gelatin or soup. Progress to regular foods as tolerated. Avoid greasy, spicy, heavy foods. If nausea and/or vomiting occur, drink only clear liquids until the nausea and/or vomiting subsides. Call your physician if vomiting continues.  Special Instructions/Symptoms: Your throat may  feel dry or sore from the anesthesia or the breathing tube placed in your throat during surgery. If this causes discomfort, gargle with warm salt water. The discomfort should disappear within 24 hours.

## 2014-01-22 NOTE — Op Note (Signed)
Dictation Number [id number]

## 2014-01-22 NOTE — Anesthesia Postprocedure Evaluation (Signed)
Anesthesia Post Note  Patient: [name]  Procedure(s) Performed: Procedure(s) (LRB): LEFT CARPAL TUNNEL RELEASE (Left)  Anesthesia type: General  Patient location: PACU  Post pain: Pain level controlled and Adequate analgesia  Post assessment: Post-op Vital signs reviewed, Patient's Cardiovascular Status Stable, Respiratory Function Stable, Patent Airway and Pain level controlled  Last Vitals:  Filed Vitals:   01/22/14 1032  BP: 114/61  Pulse: 101  Temp: 36.8 C  Resp: 18    Post vital signs: Reviewed and stable  Level of consciousness: awake, alert  and oriented  Complications: No apparent anesthesia complications

## 2014-01-22 NOTE — Op Note (Signed)
NAMEBONI, DOWNIE NO.:  [id number]  MEDICAL RECORD NO.:  [id number]  LOCATION:                                 FACILITY:  PHYSICIAN:  Cindee Salt, M.D.            DATE OF BIRTH:  DATE OF PROCEDURE:  01/22/2014 DATE OF DISCHARGE:                              OPERATIVE REPORT   PREOPERATIVE DIAGNOSIS:  Carpal tunnel syndrome, left hand.  POSTOPERATIVE DIAGNOSIS:  Carpal tunnel syndrome, left hand.  OPERATION:  Decompression left median nerve.  SURGEON:  Cindee Salt, MD  ANESTHESIA:  General with local infiltration.  ANESTHESIOLOGIST:  Achille Rich, MD  HISTORY:  The patient is a 38 year old female with a history of bilateral carpal tunnel syndrome.  She has undergone release to the right side with good results.  She is admitted now for release of left. Pre, peri, and postoperative course have been discussed along with risks and complications.  She is aware that there is no guarantee with the surgery, possibility of infection, recurrence of injury to arteries, nerves, tendons, incomplete relief of symptoms, and dystrophy.  In the preoperative area, the patient is seen, the extremity marked by both patient and surgeon, antibiotic given.  PROCEDURE IN DETAIL:  The patient was brought to the operating room where a general anesthetic was carried out without difficulty.  She was prepped using ChloraPrep, supine position, left arm free.  A 3-minute dry time was allowed.  Time-out taken confirming the patient and procedure.  A longitudinal incision was made in the left palm, carried down through subcutaneous tissue.  Bleeders were electrocauterized. Palmar fascia was split.  Superficial palmar arch identified.  Flexor tendon to the ring and little finger identified.  To the ulnar side of the median nerve, carpal retinaculum was incised with sharp dissection. Right angle and Sewall retractor were placed between skin and forearm fascia.  The fascia was  released for approximately a centimeter and half proximal to the wrist crease under direct vision.  Canal was explored. Area of compression to the nerve was apparent.  Motor branch entered into muscle.  No further lesions were identified.  The wound was irrigated with saline and closed with interrupted 4-0 Vicryl Rapide sutures.  A local infiltration with 0.25% Marcaine without epinephrine was given, approximately 6 mL was used.  Sterile compressive dressing with the fingers free was applied.  On deflation of the tourniquet, all fingers immediately pinked.  She was taken to the recovery room for observation in satisfactory condition.  She will be discharged home to return to the [facility] [place] in 1 week on Ultram.          ______________________________ Cindee Salt, M.D.     GK/MEDQ  D:  01/22/2014  T:  01/22/2014  Job:  [id number]

## 2014-01-22 NOTE — Anesthesia Preprocedure Evaluation (Signed)
Anesthesia Evaluation  Patient identified by MRN, date of birth, ID band Patient awake    Reviewed: Allergy & Precautions, H&P , NPO status , Patient's Chart, lab work & pertinent test results  Airway Mallampati: II  Neck ROM: full    Dental   Pulmonary          Cardiovascular hypertension,     Neuro/Psych  Headaches,    GI/Hepatic GERD-  ,  Endo/Other    Renal/GU      Musculoskeletal  (+) Arthritis -,   Abdominal   Peds  Hematology   Anesthesia Other Findings   Reproductive/Obstetrics                           Anesthesia Physical Anesthesia Plan  ASA: II  Anesthesia Plan: MAC and Bier Block   Post-op Pain Management:    Induction: Intravenous  Airway Management Planned: Simple Face Mask  Additional Equipment:   Intra-op Plan:   Post-operative Plan:   Informed Consent: I have reviewed the patients History and Physical, chart, labs and discussed the procedure including the risks, benefits and alternatives for the proposed anesthesia with the patient or authorized representative who has indicated his/her understanding and acceptance.     Plan Discussed with: CRNA, Anesthesiologist and Surgeon  Anesthesia Plan Comments:         Anesthesia Quick Evaluation

## 2014-01-22 NOTE — Anesthesia Procedure Notes (Signed)
Procedure Name: LMA Insertion Date/Time: 01/22/2014 8:34 AM Performed by: Curly [place]ores Pre-anesthesia Checklist: Patient identified, Emergency Drugs available, Suction available and Patient being monitored Patient Re-evaluated:Patient Re-evaluated prior to inductionOxygen Delivery Method: Circle System Utilized Preoxygenation: Pre-oxygenation with 100% oxygen Intubation Type: IV induction Ventilation: Mask ventilation without difficulty LMA: LMA inserted LMA Size: 3.0 Number of attempts: 1 Airway Equipment and Method: bite block Placement Confirmation: positive ETCO2 and breath sounds checked- equal and bilateral Tube secured with: Tape Dental Injury: Teeth and Oropharynx as per pre-operative assessment

## 2014-01-22 NOTE — Transfer of Care (Signed)
Immediate Anesthesia Transfer of Care Note  Patient: [name]  Procedure(s) Performed: Procedure(s): LEFT CARPAL TUNNEL RELEASE (Left)  Patient Location: PACU  Anesthesia Type:General  Level of Consciousness: awake, alert  and oriented  Airway & Oxygen Therapy: Patient Spontanous Breathing and Patient connected to face mask oxygen  Post-op Assessment: Report given to PACU RN, Post -op Vital signs reviewed and stable and Patient moving all extremities  Post vital signs: Reviewed and stable  Complications: No apparent anesthesia complications

## 2014-01-22 NOTE — H&P (Signed)
[name] is a 37 year old female complaining of numbness and tingling in her left hand. She has undergone right carpal tunnel release done 2012. She had some swelling, feels that it may be due to wearing a splint too tight. She complains of an intermittent, moderate to severe stabbing throbbing pain with some numbness and tingling to the left side. She states the swelling which she experienced over the weekend has entirely resolved.  She has pictures showing a very significantly swollen hand on her right side. It is entirely gone today.  She has no new history of injury. She has had nerve conductions done in the past by Dr. Johna Roles revealing carpal tunnel syndrome bilaterally, mild on the left, moderate on the right.  She is complaining of some discomfort at the [facility] areas of the thumb on each side.  She has no history of injury to her neck, but is complaining of some neck pain.She has had her nerve conductions done revealing carpal tunnel syndrome left side with a motor delay of 4.4, sensory delay of 2.6, amplitude diminution to 39 vs 134 left as compared to right.  ALLERGIES:      Talwin, cephalosporins. MEDICATIONS:      Adderall and Ambien. SURGICAL HISTORY:     Cholecystectomy, gastric bypass, plantar fasciitis, carpal tunnel release. FAMILY MEDICAL HISTORY:    Negative., SOCIAL HISTORY:     She does not smoke or drink.  Married.  Works as Administrator, sports for [employer]. REVIEW OF SYSTEMS:      Positive for blood in her urine, lumps, balance problems, headaches, sleep disorder, easy bruising, glasses, otherwise negative 14 points.  [name] is an 38 y.o. female.   Chief Complaint: CTS left HPI: See abvove  Past Medical History  Diagnosis Date  . Hypertension   . Hyperlipidemia   . Headache, migraine   . Wears glasses   . Incontinence   . Headaches, cluster   . GERD (gastroesophageal reflux disease)   . Plantar fasciitis   . Lumbago   . Bruises easily   . Sinus problem    . Wears glasses     reading glasses only  . Arthritis     foot and knee  . Sore throat   . 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 Plantar fasciectomy  Right in 2006, Left in 2010  . Gastric bypass  03/14/11  . Cholecystectomy  07/2004  . Endometrial ablation  09/2003  . Tubal ligation  1999  . Carpal tunnel release  6/12    right-DSC    History reviewed. No pertinent family history. Social History:  reports that she has never smoked. She has never used smokeless tobacco. She reports that she does not drink alcohol or use illicit drugs.  Allergies:  Allergies  Allergen Reactions  . Morphine And Related Itching  . Pentazocine Lactate Other (See Comments)    Delusional and hallucinations    No prescriptions prior to admission    No results found for this or any previous visit (from the past 48 hour(s)).  No results found.   Pertinent items are noted in HPI.  Height 5\' 4"  (1.626 m), weight 55.792 kg (123 lb).  General appearance: alert, cooperative and appears stated age Head: Normocephalic, without obvious abnormality Neck: no JVD Resp: clear to auscultation bilaterally Cardio: regular rate and rhythm, S1, S2 normal, no murmur, click, rub  or gallop GI: soft, non-tender; bowel sounds normal; no masses,  no organomegaly Extremities: extremities normal, atraumatic, no cyanosis or edema Pulses: 2+ and symmetric Skin: Skin color, texture, turgor normal. No rashes or lesions Neurologic: Grossly normal Incision/Wound: na  Assessment/Plan We have discussed the possibility of release of the carpal canal left side. We would not recommend doing anything to the [facility] arthritis. She has had a carpal tunnel release done on the right side and would like to proceed to have that done. She is scheduled for left carpal tunnel release as an outpatient under regional anesthesia. The pre, peri and post op course  are discussed along with risks and complications. She is aware there is no guarantee with surgery, possibility of infection, recurrence, injury to arteries, nerves and tendons, incomplete relief of symptoms and dystrophy.    Nicki ReaperGary R Azai Gaffin 01/22/2014, 6:56 AM

## 2014-01-23 ENCOUNTER — Encounter (HOSPITAL_BASED_OUTPATIENT_CLINIC_OR_DEPARTMENT_OTHER): Payer: Self-pay | Admitting: Orthopedic Surgery

## 2014-05-19 ENCOUNTER — Encounter (HOSPITAL_BASED_OUTPATIENT_CLINIC_OR_DEPARTMENT_OTHER): Payer: Self-pay | Admitting: Emergency Medicine

## 2014-05-19 ENCOUNTER — Emergency Department (HOSPITAL_BASED_OUTPATIENT_CLINIC_OR_DEPARTMENT_OTHER)
Admission: EM | Admit: 2014-05-19 | Discharge: 2014-05-19 | Disposition: A | Discharge status: Home / Self Care | Payer: Private Health Insurance - Indemnity | Attending: Emergency Medicine | Admitting: Emergency Medicine

## 2014-05-19 DIAGNOSIS — Z8639 Personal history of other endocrine, nutritional and metabolic disease: Secondary | ICD-10-CM | POA: Insufficient documentation

## 2014-05-19 DIAGNOSIS — Z791 Long term (current) use of non-steroidal anti-inflammatories (NSAID): Secondary | ICD-10-CM | POA: Insufficient documentation

## 2014-05-19 DIAGNOSIS — M129 Arthropathy, unspecified: Secondary | ICD-10-CM | POA: Diagnosis not present

## 2014-05-19 DIAGNOSIS — Z79899 Other long term (current) drug therapy: Secondary | ICD-10-CM | POA: Diagnosis not present

## 2014-05-19 DIAGNOSIS — I10 Essential (primary) hypertension: Secondary | ICD-10-CM | POA: Diagnosis not present

## 2014-05-19 DIAGNOSIS — Z862 Personal history of diseases of the blood and blood-forming organs and certain disorders involving the immune mechanism: Secondary | ICD-10-CM | POA: Diagnosis not present

## 2014-05-19 DIAGNOSIS — G43909 Migraine, unspecified, not intractable, without status migrainosus: Secondary | ICD-10-CM | POA: Insufficient documentation

## 2014-05-19 DIAGNOSIS — Z8709 Personal history of other diseases of the respiratory system: Secondary | ICD-10-CM | POA: Insufficient documentation

## 2014-05-19 DIAGNOSIS — L089 Local infection of the skin and subcutaneous tissue, unspecified: Secondary | ICD-10-CM | POA: Insufficient documentation

## 2014-05-19 DIAGNOSIS — R21 Rash and other nonspecific skin eruption: Secondary | ICD-10-CM | POA: Insufficient documentation

## 2014-05-19 DIAGNOSIS — Z8719 Personal history of other diseases of the digestive system: Secondary | ICD-10-CM | POA: Insufficient documentation

## 2014-05-19 DIAGNOSIS — Z792 Long term (current) use of antibiotics: Secondary | ICD-10-CM | POA: Diagnosis not present

## 2014-05-19 MED ORDER — SULFAMETHOXAZOLE-TRIMETHOPRIM 200-40 MG/5ML PO SUSP: 20.0000 mL | Freq: Two times a day (BID) | ORAL | Status: DC

## 2014-05-19 MED ORDER — SULFAMETHOXAZOLE-TRIMETHOPRIM 800-160 MG PO TABS: 1.0000 | ORAL_TABLET | Freq: Two times a day (BID) | ORAL | Status: DC

## 2014-05-19 NOTE — ED Notes (Signed)
Pt reports she has been seen for a skin condition, started on ABX and scheduled to see a dermatologist in 2 days. Concerned with affected area on neck today.

## 2014-05-19 NOTE — Discharge Instructions (Signed)
Wound Infection °A wound infection happens when a type of germ (bacteria) starts growing in the wound. In some cases, this can cause the wound to break open. If cared for properly, the infected wound will heal from the inside to the outside. Wound infections need treatment. °CAUSES °An infection is caused by bacteria growing in the wound.  °SYMPTOMS  °· Increase in redness, swelling, or pain at the wound site. °· Increase in drainage at the wound site. °· Wound or bandage (dressing) starts to smell bad. °· Fever. °· Feeling tired or fatigued. °· Pus draining from the wound. °TREATMENT  °Your health care provider will prescribe antibiotic medicine. The wound infection should improve within 24 to 48 hours. Any redness around the wound should stop spreading and the wound should be less painful.  °HOME CARE INSTRUCTIONS  °· Only take over-the-counter or prescription medicines for pain, discomfort, or fever as directed by your health care provider. °· Take your antibiotics as directed. Finish them even if you start to feel better. °· Gently wash the area with mild soap and water 2 times a day, or as directed. Rinse off the soap. Pat the area dry with a clean towel. Do not rub the wound. This may cause bleeding. °· Follow your health care provider's instructions for how often you need to change the dressing. °· Apply ointment and a dressing to the wound as directed. °· If the dressing sticks, moisten it with soapy water and gently remove it. °· Change the bandage right away if it becomes wet, dirty, or develops a bad smell. °· Take showers. Do not take tub baths, swim, or do anything that may soak the wound until it is healed. °· Avoid exercises that make you sweat heavily. °· Use anti-itch medicine as directed by your health care provider. The wound may itch when it is healing. Do not pick or scratch at the wound. °· Follow up with your health care provider to get your wound rechecked as directed. °SEEK MEDICAL CARE  IF: °· You have an increase in swelling, pain, or redness around the wound. °· You have an increase in the amount of pus coming from the wound. °· There is a bad smell coming from the wound. °· More of the wound breaks open. °· You have a fever. °MAKE SURE YOU:  °· Understand these instructions. °· Will watch your condition. °· Will get help right away if you are not doing well or get worse. °Document Released: 05/14/2003 Document Revised: 08/20/2013 Document Reviewed: 12/19/2010 °ExitCare® Patient Information ©2015 [employer]. This information is not intended to replace advice given to you by your health care provider. Make sure you discuss any questions you have with your health care provider. ° °

## 2014-05-19 NOTE — ED Notes (Signed)
C/o scattered rash after being on prednisone for WC injury

## 2014-05-19 NOTE — ED Provider Notes (Signed)
CSN: [id number]     Arrival date & time 05/19/14  1227 History   First MD Initiated Contact with Patient 05/19/14 1258     Chief Complaint  Patient presents with  . Rash     (Consider location/radiation/quality/duration/timing/severity/associated sxs/prior Treatment) Patient is a 38 y.o. female presenting with rash. The history is provided by the patient. No language interpreter was used.  Rash Location:  Full body Quality: dryness, itchiness, peeling and redness   Severity:  Moderate Onset quality:  Gradual Timing:  Constant Progression:  Worsening Chronicity:  New Relieved by:  Nothing Worsened by:  Nothing tried Ineffective treatments:  None tried Associated symptoms: joint pain   Pt complains of a rash after prednisone.  Pt reports she thinks she is allergic to prednisone.  Pt has appointment to see Dermatologist on Wednesday.  Pt is worried that she has mrsa.  Past Medical History  Diagnosis Date  . Hypertension   . Hyperlipidemia   . Headache, migraine   . Wears glasses   . Incontinence   . Headaches, cluster   . GERD (gastroesophageal reflux disease)   . Plantar fasciitis   . Lumbago   . Bruises easily   . Sinus problem   . Wears glasses     reading glasses only  . Arthritis     foot and knee  . Sore throat   . 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 Plantar fasciectomy  Right in 2006, Left in 2010  . Gastric bypass  03/14/11  . Cholecystectomy  07/2004  . Endometrial ablation  09/2003  . Tubal ligation  1999  . Carpal tunnel release  6/12    right-DSC  . Carpal tunnel release Left 01/22/2014    Procedure: LEFT CARPAL TUNNEL RELEASE;  Surgeon: Nicki Reaper, MD;  Location:  SURGERY CENTER;  Service: Orthopedics;  Laterality: Left;   No family history on file. History  Substance Use Topics  . Smoking status: Never Smoker   . Smokeless tobacco: Never Used  . Alcohol Use:  No   OB History   Grav Para Term Preterm Abortions TAB SAB Ect Mult Living                 Review of Systems  Musculoskeletal: Positive for arthralgias.  Skin: Positive for rash.  All other systems reviewed and are negative.     Allergies  Cephalexin; Cephalosporins; Codeine; Morphine and related; Pentazocine lactate; and Prednisone  Home Medications   Prior to Admission medications   Medication Sig Start Date End Date Taking? Authorizing Provider  Amoxicillin (AMOXIL PO) Take by mouth.   Yes Historical Provider, MD  amphetamine-dextroamphetamine (ADDERALL) 20 MG tablet Take 20 mg by mouth 4 (four) times daily.     Historical Provider, MD  Biotin 1000 MCG tablet Take 1,000 mcg by mouth 2 (two) times daily.    Historical Provider, MD  Cyanocobalamin (VITAMIN B-12 IJ) Inject 1 Syringe as directed every 30 (thirty) days.    Historical Provider, MD  Multiple Vitamins-Minerals (MULTIVITAMIN WITH MINERALS) tablet Take 2 tablets by mouth daily.      Historical Provider, MD  naproxen sodium (ANAPROX) 220 MG tablet Take 220 mg by mouth 2 (two) times daily with a meal.    Historical Provider, MD  rizatriptan (MAXALT) 10 MG tablet Take 10 mg by mouth as needed. May repeat in 2 hours if needed for migraine  Historical Provider, MD  traMADol (ULTRAM) 50 MG tablet Take 1 tablet (50 mg total) by mouth every 6 (six) hours as needed. 01/22/14   Cindee Salt, MD  zolpidem (AMBIEN) 10 MG tablet Take 10 mg by mouth at bedtime.     Historical Provider, MD   BP 149/105  Pulse 120  Temp(Src) 98.5 F (36.9 C) (Oral)  Resp 16  Ht  (1.626 m)  Wt 129 lb (58.514 kg)  BMI 22.13 kg/m2  SpO2 97% Physical Exam  Nursing note and vitals reviewed. Constitutional: She is oriented to person, place, and time. She appears well-developed and well-nourished.  HENT:  Head: Normocephalic.  Eyes: Pupils are equal, round, and reactive to light.  Cardiovascular: Normal rate.   Pulmonary/Chest: Effort normal.   Abdominal: Soft.  Musculoskeletal: Normal range of motion.  Neurological: She is alert and oriented to person, place, and time. She has normal reflexes.  Skin: There is erythema.  Psychiatric: She has a normal mood and affect.    ED Course  Procedures (including critical care time) Labs Review Labs Reviewed - No data to display  Imaging Review No results found.   EKG Interpretation None      MDM   Final diagnoses:  Skin infection        Bactrim ds    Elson Areas, PA-C 05/19/14 1330  Lonia Skinner [place], [place] 05/19/14 1331

## 2014-05-19 NOTE — ED Provider Notes (Signed)
History/physical exam/procedure(s) were performed by non-physician practitioner and as supervising physician I was immediately available for consultation/collaboration. I have reviewed all notes and am in agreement with care and plan.   Omayra Tulloch S Aysel Gilchrest, MD 05/19/14 1520 

## 2014-06-13 ENCOUNTER — Other Ambulatory Visit: Payer: Self-pay | Admitting: Orthopedic Surgery

## 2014-06-17 ENCOUNTER — Encounter (HOSPITAL_BASED_OUTPATIENT_CLINIC_OR_DEPARTMENT_OTHER): Payer: Self-pay | Admitting: *Deleted

## 2014-06-18 ENCOUNTER — Encounter (HOSPITAL_BASED_OUTPATIENT_CLINIC_OR_DEPARTMENT_OTHER): Payer: Self-pay | Admitting: Cardiology

## 2014-06-24 ENCOUNTER — Encounter (HOSPITAL_BASED_OUTPATIENT_CLINIC_OR_DEPARTMENT_OTHER): Payer: Managed Care, Other (non HMO) | Admitting: Certified Registered"

## 2014-06-24 ENCOUNTER — Ambulatory Visit (HOSPITAL_BASED_OUTPATIENT_CLINIC_OR_DEPARTMENT_OTHER)
Admission: RE | Admit: 2014-06-24 | Discharge: 2014-06-24 | Disposition: A | Discharge status: Home / Self Care | Payer: Managed Care, Other (non HMO) | Source: Ambulatory Visit | Attending: Orthopedic Surgery | Admitting: Orthopedic Surgery

## 2014-06-24 ENCOUNTER — Encounter (HOSPITAL_BASED_OUTPATIENT_CLINIC_OR_DEPARTMENT_OTHER): Payer: Self-pay | Admitting: Orthopedic Surgery

## 2014-06-24 ENCOUNTER — Ambulatory Visit (HOSPITAL_BASED_OUTPATIENT_CLINIC_OR_DEPARTMENT_OTHER): Payer: Managed Care, Other (non HMO) | Admitting: Certified Registered"

## 2014-06-24 ENCOUNTER — Encounter (HOSPITAL_BASED_OUTPATIENT_CLINIC_OR_DEPARTMENT_OTHER)
Admission: RE | Disposition: A | Discharge status: Home / Self Care | Payer: Self-pay | Source: Ambulatory Visit | Attending: Orthopedic Surgery

## 2014-06-24 DIAGNOSIS — Z885 Allergy status to narcotic agent status: Secondary | ICD-10-CM | POA: Diagnosis not present

## 2014-06-24 DIAGNOSIS — Z9884 Bariatric surgery status: Secondary | ICD-10-CM | POA: Diagnosis not present

## 2014-06-24 DIAGNOSIS — Z888 Allergy status to other drugs, medicaments and biological substances status: Secondary | ICD-10-CM | POA: Insufficient documentation

## 2014-06-24 DIAGNOSIS — E785 Hyperlipidemia, unspecified: Secondary | ICD-10-CM | POA: Insufficient documentation

## 2014-06-24 DIAGNOSIS — M65341 Trigger finger, right ring finger: Secondary | ICD-10-CM | POA: Diagnosis not present

## 2014-06-24 DIAGNOSIS — M65841 Other synovitis and tenosynovitis, right hand: Secondary | ICD-10-CM | POA: Diagnosis present

## 2014-06-24 DIAGNOSIS — M1389 Other specified arthritis, multiple sites: Secondary | ICD-10-CM | POA: Diagnosis not present

## 2014-06-24 HISTORY — PX: TRIGGER FINGER RELEASE: SHX641

## 2014-06-24 LAB — POCT HEMOGLOBIN-HEMACUE: HEMOGLOBIN: 11.7 g/dL — AB (ref 12.0–15.0)

## 2014-06-24 SURGERY — RELEASE, A1 PULLEY, FOR TRIGGER FINGER
Anesthesia: General | Site: Finger | Laterality: Right

## 2014-06-24 MED ORDER — FENTANYL CITRATE 0.05 MG/ML IJ SOLN: 50.0000 ug | INTRAMUSCULAR | Status: DC | PRN

## 2014-06-24 MED ORDER — BUPIVACAINE HCL (PF) 0.25 % IJ SOLN
INTRAMUSCULAR | Status: DC | PRN
  Administered 2014-06-24: 6 mL

## 2014-06-24 MED ORDER — ONDANSETRON HCL 4 MG/2ML IJ SOLN: 4.0000 mg | Freq: Once | INTRAMUSCULAR | Status: DC | PRN

## 2014-06-24 MED ORDER — TRAMADOL HCL 50 MG PO TABS: 50.0000 mg | ORAL_TABLET | Freq: Four times a day (QID) | ORAL | Status: DC | PRN

## 2014-06-24 MED ORDER — PROPOFOL 10 MG/ML IV BOLUS
INTRAVENOUS | Status: DC | PRN
  Administered 2014-06-24: 200 mg via INTRAVENOUS

## 2014-06-24 MED ORDER — ONDANSETRON HCL 4 MG/2ML IJ SOLN
INTRAMUSCULAR | Status: DC | PRN
Start: 2014-06-24 — End: 2014-06-24
  Administered 2014-06-24: 4 mg via INTRAVENOUS

## 2014-06-24 MED ORDER — CHLORHEXIDINE GLUCONATE 4 % EX LIQD: 60.0000 mL | Freq: Once | CUTANEOUS | Status: DC

## 2014-06-24 MED ORDER — MIDAZOLAM HCL 2 MG/2ML IJ SOLN
INTRAMUSCULAR | Status: AC
  Filled 2014-06-24: qty 2

## 2014-06-24 MED ORDER — OXYCODONE HCL 5 MG PO TABS: 5.0000 mg | ORAL_TABLET | Freq: Once | ORAL | Status: DC | PRN

## 2014-06-24 MED ORDER — FENTANYL CITRATE 0.05 MG/ML IJ SOLN
INTRAMUSCULAR | Status: DC | PRN
  Administered 2014-06-24: 100 ug via INTRAVENOUS

## 2014-06-24 MED ORDER — OXYCODONE HCL 5 MG/5ML PO SOLN: 5.0000 mg | Freq: Once | ORAL | Status: DC | PRN

## 2014-06-24 MED ORDER — BUPIVACAINE HCL (PF) 0.25 % IJ SOLN
INTRAMUSCULAR | Status: AC
  Filled 2014-06-24: qty 30

## 2014-06-24 MED ORDER — VANCOMYCIN HCL IN DEXTROSE 1-5 GM/200ML-% IV SOLN
1000.0000 mg | INTRAVENOUS | Status: AC
  Administered 2014-06-24: 1000 mg via INTRAVENOUS

## 2014-06-24 MED ORDER — LACTATED RINGERS IV SOLN
INTRAVENOUS | Status: DC
  Administered 2014-06-24: 11:00:00 via INTRAVENOUS

## 2014-06-24 MED ORDER — MIDAZOLAM HCL 2 MG/2ML IJ SOLN: 1.0000 mg | INTRAMUSCULAR | Status: DC | PRN

## 2014-06-24 MED ORDER — LIDOCAINE HCL (CARDIAC) 20 MG/ML IV SOLN
INTRAVENOUS | Status: DC | PRN
  Administered 2014-06-24: 60 mg via INTRAVENOUS

## 2014-06-24 MED ORDER — MIDAZOLAM HCL 5 MG/5ML IJ SOLN
INTRAMUSCULAR | Status: DC | PRN
  Administered 2014-06-24: 2 mg via INTRAVENOUS

## 2014-06-24 MED ORDER — VANCOMYCIN HCL IN DEXTROSE 1-5 GM/200ML-% IV SOLN
INTRAVENOUS | Status: AC
  Filled 2014-06-24: qty 200

## 2014-06-24 MED ORDER — VANCOMYCIN HCL IN DEXTROSE 1-5 GM/200ML-% IV SOLN: 1000.0000 mg | INTRAVENOUS | Status: DC

## 2014-06-24 MED ORDER — FENTANYL CITRATE 0.05 MG/ML IJ SOLN
INTRAMUSCULAR | Status: AC
  Filled 2014-06-24: qty 2

## 2014-06-24 MED ORDER — HYDROMORPHONE HCL 1 MG/ML IJ SOLN
0.2500 mg | INTRAMUSCULAR | Status: DC | PRN
Start: 2014-06-24 — End: 2014-06-24

## 2014-06-24 SURGICAL SUPPLY — 37 items
BANDAGE COBAN STERILE 2 (GAUZE/BANDAGES/DRESSINGS) ×3 IMPLANT
BLADE SURG 15 STRL LF DISP TIS (BLADE) ×1 IMPLANT
BLADE SURG 15 STRL SS (BLADE) ×3
BNDG CMPR 9X4 STRL LF SNTH (GAUZE/BANDAGES/DRESSINGS)
BNDG ESMARK 4X9 LF (GAUZE/BANDAGES/DRESSINGS) IMPLANT
CHLORAPREP W/TINT 26ML (MISCELLANEOUS) ×3 IMPLANT
CORDS BIPOLAR (ELECTRODE) IMPLANT
COVER BACK TABLE 60X90IN (DRAPES) ×3 IMPLANT
COVER MAYO STAND STRL (DRAPES) ×3 IMPLANT
CUFF TOURNIQUET SINGLE 18IN (TOURNIQUET CUFF) IMPLANT
DECANTER SPIKE VIAL GLASS SM (MISCELLANEOUS) IMPLANT
DRAPE EXTREMITY TIBURON (DRAPES) ×3 IMPLANT
DRAPE SURG 17X23 STRL (DRAPES) ×3 IMPLANT
GAUZE SPONGE 4X4 12PLY STRL (GAUZE/BANDAGES/DRESSINGS) ×3 IMPLANT
GAUZE XEROFORM 1X8 LF (GAUZE/BANDAGES/DRESSINGS) ×3 IMPLANT
GLOVE BIO SURGEON STRL SZ 6.5 (GLOVE) ×2 IMPLANT
GLOVE BIO SURGEONS STRL SZ 6.5 (GLOVE) ×2
GLOVE BIOGEL PI IND STRL 7.0 (GLOVE) IMPLANT
GLOVE BIOGEL PI IND STRL 8.5 (GLOVE) ×1 IMPLANT
GLOVE BIOGEL PI INDICATOR 7.0 (GLOVE) ×2
GLOVE BIOGEL PI INDICATOR 8.5 (GLOVE) ×2
GLOVE SURG ORTHO 8.0 STRL STRW (GLOVE) ×3 IMPLANT
GOWN STRL REUS W/ TWL LRG LVL3 (GOWN DISPOSABLE) ×1 IMPLANT
GOWN STRL REUS W/TWL LRG LVL3 (GOWN DISPOSABLE) ×3
GOWN STRL REUS W/TWL XL LVL3 (GOWN DISPOSABLE) ×3 IMPLANT
NEEDLE 27GAX1X1/2 (NEEDLE) ×3 IMPLANT
NS IRRIG 1000ML POUR BTL (IV SOLUTION) ×3 IMPLANT
PACK BASIN DAY SURGERY FS (CUSTOM PROCEDURE TRAY) ×3 IMPLANT
PADDING CAST ABS 4INX4YD NS (CAST SUPPLIES) ×2
PADDING CAST ABS COTTON 4X4 ST (CAST SUPPLIES) ×1 IMPLANT
SHEET MEDIUM DRAPE 40X70 STRL (DRAPES) ×2 IMPLANT
STOCKINETTE 4X48 STRL (DRAPES) ×3 IMPLANT
SUT VICRYL RAPIDE 4/0 PS 2 (SUTURE) ×3 IMPLANT
SYR BULB 3OZ (MISCELLANEOUS) ×3 IMPLANT
SYR CONTROL 10ML LL (SYRINGE) ×3 IMPLANT
TOWEL OR 17X24 6PK STRL BLUE (TOWEL DISPOSABLE) ×6 IMPLANT
UNDERPAD 30X30 INCONTINENT (UNDERPADS AND DIAPERS) ×3 IMPLANT

## 2014-06-24 NOTE — Op Note (Signed)
Dictation Number [id number]

## 2014-06-24 NOTE — Op Note (Signed)
NAMGwen Duran:  Eldridge, Aleli              ACCOUNT NO.:  [id number]  MEDICAL RECORD NO.:  [id number]  LOCATION:                                 FACILITY:  PHYSICIAN:  Cindee SaltGary Vonya Ohalloran, M.D.       DATE OF BIRTH:  1976-04-28  DATE OF PROCEDURE:  06/24/2014 DATE OF DISCHARGE:                              OPERATIVE REPORT   PREOPERATIVE DIAGNOSIS:  Stenosing tenosynovitis, right ring finger.  POSTOPERATIVE DIAGNOSIS:  Stenosing tenosynovitis, right ring finger.  OPERATION:  Release of A1 pulley, right ring finger.  SURGEON:  Cindee SaltGary Lashaundra Lehrmann, MD.  ASSISTANT:  Betha LoaKevin Kourtnei Rauber, MD  ANESTHESIA:  General local infiltration.  ANESTHESIOLOGIST:  Sheldon Silvanavid Crews, M.D.  HISTORY:  The patient is a 38 year old female with a history of triggering of Duran right ring finger.  This has occurred following carpal tunnel release.  This has not responded to conservative treatment.  She is allergic to cortisone and elected to proceed directly to a surgical release.  Pre, peri, and postoperative course have been discussed along with risks and complications.  She is aware that there is no guarantee with the surgery; possibility of infection; recurrence of injury to arteries, nerves, tendons; incomplete release of symptoms; and dystrophy.  In preoperative area, the patient is seen and the extremity marked by both patient and surgeon.  Antibiotic given.  PROCEDURE IN DETAIL:  The patient was brought to the operating room where a general anesthetic was carried out without difficulty.  She was prepped using ChloraPrep in supine position with the right arm free.  A 3-minute dry time was allowed.  Time-out taken confirming the patient and procedure.  The limb was exsanguinated with an Esmarch bandage. Tourniquet was placed in forearm, it was inflated to 250 mmHg.  An oblique incision was made over the A1 pulley of the right ring finger, carried down through subcutaneous tissue.  Bleeders were electrocauterized with bipolar.   Retractors placed protecting neurovascular structures radially and ulnarly. The incision was then made on the radial aspect of the A1 pulley, a small incision centrally in A2.  Partial tenosynovectomy performed proximally with separation of the 2 flexor tendons. Finger placed through a full range motion, no further triggering was noted.  The wound was irrigated and the skin closed with interrupted 4-0 Vicryl Rapide sutures.  Local infiltration with 0.25% Marcaine without epinephrine was given, approximately 5 mL was used.  Sterile compressive dressing to the hand was applied with the fingers free.  On deflation of the tourniquet, all fingers immediately pinked.  She was taken to the recovery room for observation in satisfactory condition.  She will be discharged home to [facility] [place] in 1 week on tramadol.          ______________________________ Cindee SaltGary Layten Aiken, M.D.     GK/MEDQ  D:  06/24/2014  T:  06/24/2014  Job:  [id number]

## 2014-06-24 NOTE — Anesthesia Preprocedure Evaluation (Addendum)
Anesthesia Evaluation  Patient identified by MRN, date of birth, ID band Patient awake    Reviewed: Allergy & Precautions, H&P , NPO status , Patient's Chart, lab work & pertinent test results  Airway Mallampati: I  TM Distance: >3 FB Neck ROM: Full    Dental  (+) Teeth Intact, Dental Advisory Given   Pulmonary  breath sounds clear to auscultation        Cardiovascular hypertension, Rhythm:Regular Rate:Normal     Neuro/Psych    GI/Hepatic GERD-  Medicated and Controlled,  Endo/Other    Renal/GU      Musculoskeletal   Abdominal   Peds  Hematology   Anesthesia Other Findings   Reproductive/Obstetrics                             Anesthesia Physical Anesthesia Plan  ASA: II  Anesthesia Plan: General   Post-op Pain Management:    Induction: Intravenous  Airway Management Planned: LMA  Additional Equipment:   Intra-op Plan:   Post-operative Plan: Extubation in OR  Informed Consent: I have reviewed the patients History and Physical, chart, labs and discussed the procedure including the risks, benefits and alternatives for the proposed anesthesia with the patient or authorized representative who has indicated his/her understanding and acceptance.   Dental advisory given  Plan Discussed with: CRNA, Anesthesiologist and Surgeon  Anesthesia Plan Comments: (Will not use Decadron due to severe abnormal reaction to similar in past.)       Anesthesia Quick Evaluation

## 2014-06-24 NOTE — H&P (Signed)
Elizabeth CongressSonia Duran is a 38 yo female complaining of numbness and tingling in her left hand. She has undergone right carpal tunnel release done 2012. She had some swelling, feels that it may be due to wearing a splint too tight. She complains of an intermittent, moderate to severe stabbing throbbing pain with some numbness and tingling to the left side. She states the swelling which she experienced over the weekend has entirely resolved.  She has pictures showing a very significantly swollen hand on her right side. It is entirely gone today.  She has no new history of injury. She has had nerve conductions done in the past by Dr. Johna RolesPelligra revealing carpal tunnel syndrome bilaterally, mild on the left, moderate on the right.  [facility] of the thumb on each side.  She has no history of injury to her neck, but is complaining of some neck pain.She is post left carpal tunnel release. This is doing well. She has had her lab work done for her right ring finger. Apparently this is normal but she has developed a trigger with tenderness at the A-1 pulley and catching.   ALLERGIES:      Talwin, cephalosporins. MEDICATIONS:      Adderall and Ambien. SURGICAL HISTORY:     Cholecystectomy, gastric bypass, plantar fasciitis, carpal tunnel release. FAMILY MEDICAL HISTORY:    Negative., SOCIAL HISTORY:     She does not smoke or drink.  Married.  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 REVIEW OF SYSTEMS:      Positive for blood in her urine, lumps, balance problems, headaches, sleep disorder,                                                      easy bruising, glasses, otherwise negative 14 points.  [name] is an 38 y.o. female.   Chief Complaint: STS right ring finger HPI: see above  Past Medical History  Diagnosis Date  . Hyperlipidemia   . Headache, migraine   . Wears glasses   . Incontinence   . Headaches, cluster   . Plantar fasciitis   . Lumbago   .  Bruises easily   . Sinus problem   . Wears glasses     reading glasses only  . Arthritis     foot and knee  . Sore throat   . 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 Hypertension     resolved 5 yrs ago  . GERD (gastroesophageal reflux disease)     resolved 5 yrs ago    Past Surgical History  Procedure Laterality Date  . Gastric bypass  03/14/11    lap. Roux-en-Y  . Tubal ligation  1999  . Carpal tunnel release Right 02/02/2011  . Carpal tunnel release Left 01/22/2014    Procedure: LEFT CARPAL TUNNEL RELEASE;  Surgeon: Nicki ReaperGary R Grecia Lynk, MD;  Location: [facility];  Service: Orthopedics;  Laterality: Left;  . Plantar fascia release Left 08/25/2009  . Plantar fascia release Right 01/02/2004  . Cholecystectomy  08/06/2004  . Hysteroscopy with novasure  10/15/2004  . Esophagogastroduodenoscopy  03/14/2011    History reviewed. No pertinent family history. Social History:  reports that she has never smoked. She has never used smokeless tobacco. She  reports that she does not drink alcohol or use illicit drugs.  Allergies:  Allergies  Allergen Reactions  . Cephalexin Other (See Comments)    Stomach pain  . Cephalosporins   . Codeine Itching  . Morphine And Related Itching  . Pentazocine Lactate Other (See Comments)    Delusional and hallucinations  . Prednisone     No prescriptions prior to admission    No results found for this or any previous visit (from the past 48 hour(s)).  No results found.   Pertinent items are noted in HPI.  Height 5\' 4"  (1.626 m), weight 64.411 kg (142 lb).  General appearance: alert, cooperative and appears stated age Head: Normocephalic, without obvious abnormality Neck: no JVD Resp: clear to auscultation bilaterally Cardio: regular rate and rhythm, S1, S2 normal, no murmur, click, rub or gallop GI: soft, non-tender; bowel sounds normal; no masses,  no organomegaly Extremities: trigger right ring finger Pulses:  2+ and symmetric Skin: Skin color, texture, turgor normal. No rashes or lesions Neurologic: Grossly normal Incision/Wound: na  Assessment/Plan Diagnosis: trigger right ring finger She is allergic to Cortisone, as such we do not have that option. She would like to get this taken care of. We would recommend surgical release A-1 pulley. Pre, peri and post op care are discussed along with risks and complications. Patient is aware there is no guarantee with surgery, possibility of infection, injury to arteries, nerves, and tendons, incomplete relief and dystrophy. She is scheduled for release A-1 pulley right ring finger as an outpatient under regional anesthesia.  Tushar Enns R 06/24/2014, 10:43 AM

## 2014-06-24 NOTE — Transfer of Care (Signed)
Immediate Anesthesia Transfer of Care Note  Patient: [name]  Procedure(s) Performed: Procedure(s) with comments: RELEASE A-1 PULLEY RIGHT RING FINGER (Right) - ANESTHESIA: IV REGIONAL FAB  Patient Location: PACU  Anesthesia Type:General  Level of Consciousness: awake and patient cooperative  Airway & Oxygen Therapy: Patient Spontanous Breathing and Patient connected to face mask oxygen  Post-op Assessment: Report given to PACU RN and Post -op Vital signs reviewed and stable  Post vital signs: Reviewed and stable  Complications: No apparent anesthesia complications

## 2014-06-24 NOTE — Brief Op Note (Signed)
06/24/2014  12:45 PM  PATIENT:  [name]  38 y.o. female  PRE-OPERATIVE DIAGNOSIS:  TRIGGER RIGHT RING FINGER  POST-OPERATIVE DIAGNOSIS:  TRIGGER RIGHT RING FINGER  PROCEDURE:  Procedure(s) with comments: RELEASE A-1 PULLEY RIGHT RING FINGER (Right) - ANESTHESIA: IV REGIONAL FAB  SURGEON:  Surgeon(s) and Role:    * Cindee SaltGary Renate Danh, MD - Primary    * Betha LoaKevin Shalik Sanfilippo, MD - Assisting  PHYSICIAN ASSISTANT:   ASSISTANTS: K Shalaya Swailes,MD   ANESTHESIA:   local and general  EBL:  Total I/O In: 700 [I.V.:700] Out: -   BLOOD ADMINISTERED:none  DRAINS: none   LOCAL MEDICATIONS USED:  BUPIVICAINE   SPECIMEN:  No Specimen  DISPOSITION OF SPECIMEN:  N/A  COUNTS:  YES  TOURNIQUET:   Total Tourniquet Time Documented: Forearm (Right) - 7 minutes Total: Forearm (Right) - 7 minutes   DICTATION: .Other Dictation: Dictation Number [id number]  PLAN OF CARE: Discharge to home after PACU  PATIENT DISPOSITION:  PACU - hemodynamically stable.

## 2014-06-24 NOTE — Anesthesia Postprocedure Evaluation (Signed)
Anesthesia Post Note  Patient: Elizabeth FormSonia D Haught  Procedure(s) Performed: Procedure(s) (LRB): RELEASE A-1 PULLEY RIGHT RING FINGER (Right)  Anesthesia type: general  Patient location: PACU  Post pain: Pain level controlled  Post assessment: Patient's Cardiovascular Status Stable  Last Vitals:  Filed Vitals:   06/24/14 1315  BP: 107/68  Pulse: 87  Temp:   Resp: 12    Post vital signs: Reviewed and stable  Level of consciousness: sedated  Complications: No apparent anesthesia complications

## 2014-06-24 NOTE — Discharge Instructions (Addendum)

## 2014-06-26 ENCOUNTER — Encounter (HOSPITAL_BASED_OUTPATIENT_CLINIC_OR_DEPARTMENT_OTHER): Payer: Self-pay | Admitting: Orthopedic Surgery

## 2014-08-04 ENCOUNTER — Other Ambulatory Visit: Payer: Self-pay | Admitting: Obstetrics and Gynecology

## 2014-08-04 DIAGNOSIS — N6320 Unspecified lump in the left breast, unspecified quadrant: Secondary | ICD-10-CM

## 2014-08-05 LAB — CYTOLOGY - PAP

## 2014-08-14 ENCOUNTER — Other Ambulatory Visit: Payer: Self-pay

## 2014-10-08 ENCOUNTER — Ambulatory Visit
Admission: RE | Admit: 2014-10-08 | Discharge: 2014-10-08 | Disposition: A | Discharge status: Home / Self Care | Payer: 59 | Source: Ambulatory Visit | Attending: Obstetrics and Gynecology | Admitting: Obstetrics and Gynecology

## 2014-10-08 DIAGNOSIS — N6320 Unspecified lump in the left breast, unspecified quadrant: Secondary | ICD-10-CM

## 2015-06-18 ENCOUNTER — Emergency Department (HOSPITAL_COMMUNITY)
Admission: EM | Admit: 2015-06-18 | Discharge: 2015-06-18 | Disposition: A | Discharge status: Home / Self Care | Payer: Self-pay | Attending: Emergency Medicine | Admitting: Emergency Medicine

## 2015-06-18 ENCOUNTER — Emergency Department (HOSPITAL_COMMUNITY): Payer: Self-pay

## 2015-06-18 ENCOUNTER — Emergency Department (HOSPITAL_COMMUNITY): Payer: 59

## 2015-06-18 ENCOUNTER — Encounter (HOSPITAL_COMMUNITY): Payer: Self-pay | Admitting: *Deleted

## 2015-06-18 DIAGNOSIS — R000 Tachycardia, unspecified: Secondary | ICD-10-CM | POA: Insufficient documentation

## 2015-06-18 DIAGNOSIS — R569 Unspecified convulsions: Secondary | ICD-10-CM | POA: Insufficient documentation

## 2015-06-18 DIAGNOSIS — R51 Headache: Secondary | ICD-10-CM | POA: Insufficient documentation

## 2015-06-18 DIAGNOSIS — R251 Tremor, unspecified: Secondary | ICD-10-CM | POA: Insufficient documentation

## 2015-06-18 DIAGNOSIS — Z8719 Personal history of other diseases of the digestive system: Secondary | ICD-10-CM | POA: Insufficient documentation

## 2015-06-18 DIAGNOSIS — Z79899 Other long term (current) drug therapy: Secondary | ICD-10-CM | POA: Insufficient documentation

## 2015-06-18 DIAGNOSIS — H539 Unspecified visual disturbance: Secondary | ICD-10-CM | POA: Insufficient documentation

## 2015-06-18 DIAGNOSIS — R110 Nausea: Secondary | ICD-10-CM | POA: Insufficient documentation

## 2015-06-18 DIAGNOSIS — I10 Essential (primary) hypertension: Secondary | ICD-10-CM | POA: Insufficient documentation

## 2015-06-18 DIAGNOSIS — Z9889 Other specified postprocedural states: Secondary | ICD-10-CM | POA: Insufficient documentation

## 2015-06-18 DIAGNOSIS — Z8739 Personal history of other diseases of the musculoskeletal system and connective tissue: Secondary | ICD-10-CM | POA: Insufficient documentation

## 2015-06-18 DIAGNOSIS — F1510 Other stimulant abuse, uncomplicated: Secondary | ICD-10-CM | POA: Insufficient documentation

## 2015-06-18 DIAGNOSIS — Z8709 Personal history of other diseases of the respiratory system: Secondary | ICD-10-CM | POA: Insufficient documentation

## 2015-06-18 DIAGNOSIS — E785 Hyperlipidemia, unspecified: Secondary | ICD-10-CM | POA: Insufficient documentation

## 2015-06-18 DIAGNOSIS — Z973 Presence of spectacles and contact lenses: Secondary | ICD-10-CM | POA: Insufficient documentation

## 2015-06-18 LAB — CBC
HCT: 34.7 % — ABNORMAL LOW (ref 36.0–46.0)
Hemoglobin: 11 g/dL — ABNORMAL LOW (ref 12.0–15.0)
MCH: 28.9 pg (ref 26.0–34.0)
MCHC: 31.7 g/dL (ref 30.0–36.0)
MCV: 91.3 fL (ref 78.0–100.0)
PLATELETS: 234 10*3/uL (ref 150–400)
RBC: 3.8 MIL/uL — AB (ref 3.87–5.11)
RDW: 15.2 % (ref 11.5–15.5)
WBC: 7.4 10*3/uL (ref 4.0–10.5)

## 2015-06-18 LAB — BASIC METABOLIC PANEL
Anion gap: 8 (ref 5–15)
BUN: 6 mg/dL (ref 6–20)
CALCIUM: 9.3 mg/dL (ref 8.9–10.3)
CO2: 25 mmol/L (ref 22–32)
CREATININE: 1 mg/dL (ref 0.44–1.00)
Chloride: 103 mmol/L (ref 101–111)
GFR calc Af Amer: >60 mL/min (ref >60)
GFR calc non Af Amer: >60 mL/min (ref >60)
GLUCOSE: 110 mg/dL — AB (ref 65–99)
Potassium: 4.7 mmol/L (ref 3.5–5.1)
Sodium: 136 mmol/L (ref 135–145)

## 2015-06-18 LAB — RAPID URINE DRUG SCREEN, HOSP PERFORMED
AMPHETAMINES: POSITIVE — AB
Barbiturates: NOT DETECTED
Benzodiazepines: NOT DETECTED
Cocaine: NOT DETECTED
OPIATES: NOT DETECTED
Tetrahydrocannabinol: NOT DETECTED

## 2015-06-18 LAB — TSH: TSH: 4.062 u[IU]/mL (ref 0.350–4.500)

## 2015-06-18 MED ORDER — MORPHINE SULFATE (PF) 4 MG/ML IV SOLN
4.0000 mg | Freq: Once | INTRAVENOUS | Status: AC
  Administered 2015-06-18: 4 mg via INTRAVENOUS
  Filled 2015-06-18: qty 1

## 2015-06-18 MED ORDER — GADOBENATE DIMEGLUMINE 529 MG/ML IV SOLN
14.0000 mL | Freq: Once | INTRAVENOUS | Status: AC | PRN
  Administered 2015-06-18: 14 mL via INTRAVENOUS

## 2015-06-18 MED ORDER — ACETAMINOPHEN 325 MG PO TABS: 650.0000 mg | ORAL_TABLET | Freq: Once | ORAL | Status: DC

## 2015-06-18 MED ORDER — ACETAMINOPHEN 160 MG/5ML PO SOLN
650.0000 mg | Freq: Once | ORAL | Status: AC
Start: 2015-06-18 — End: 2015-06-18
  Administered 2015-06-18: 650 mg via ORAL
  Filled 2015-06-18: qty 20.3

## 2015-06-18 MED ORDER — METOCLOPRAMIDE HCL 5 MG/ML IJ SOLN
5.0000 mg | Freq: Once | INTRAMUSCULAR | Status: AC
  Administered 2015-06-18: 5 mg via INTRAVENOUS
  Filled 2015-06-18: qty 2

## 2015-06-18 NOTE — ED Notes (Signed)
Husband witness event, states he and pt were walking at the mall, he looked back at her and noticed that her eyes had sudden rolled back into her head, wrist turned inward, then she fell straight onto her back. Husband reports seizure lasted 3-4 minutes, did not noticed if her face turned blue. Pt reports falling a week ago and hitting her head. Pt did not get evaluated for first fall, pt is not on blood thinners. Pt denies any new medications, stopping any new medication recently, denies hx of excessive drinking or drug abuse.

## 2015-06-18 NOTE — ED Provider Notes (Signed)
CSN: [id number]     Arrival date & time 06/18/15  1518 History   First MD Initiated Contact with Patient 06/18/15 1555     Chief Complaint  Patient presents with  . Seizures     (Consider location/radiation/quality/duration/timing/severity/associated sxs/prior Treatment) HPI Patient had seizure today generalized, followed by incontinence and tongue biting witnessed by her husband 2 PM today. Seizure activity lasted 3 or 4 minutes followed by confusion Presently she complains of posterior neck soreness, nausea and mild to moderate diffuse headache. No other associated symptoms. Her husband reports that she's had a slightly unsteady gait for the past week since she fell, tripped over the cat , tripped over the cat at home. No treatment prior to coming here. Other associated symptoms include bilateral hand tremors for the past week. No other associated symptoms Past Medical History  Diagnosis Date  . Hyperlipidemia   . Headache, migraine   . Wears glasses   . Incontinence   . Headaches, cluster   . Plantar fasciitis   . Lumbago   . Bruises easily   . Sinus problem   . Wears glasses     reading glasses only  . Arthritis     foot and knee  . Sore throat   . 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 Hypertension     resolved 5 yrs ago  . GERD (gastroesophageal reflux disease)     resolved 5 yrs ago   Past Surgical History  Procedure Laterality Date  . Gastric bypass  03/14/11    lap. Roux-en-Y  . Tubal ligation  1999  . Carpal tunnel release Right 02/02/2011  . Carpal tunnel release Left 01/22/2014    Procedure: LEFT CARPAL TUNNEL RELEASE;  Surgeon: Nicki ReaperGary R Kuzma, MD;  Location: [facility];  Service: Orthopedics;  Laterality: Left;  . Plantar fascia release Left 08/25/2009  . Plantar fascia release Right 01/02/2004  . Cholecystectomy  08/06/2004  . Hysteroscopy with novasure  10/15/2004  . Esophagogastroduodenoscopy  03/14/2011  . Trigger finger  release Right 06/24/2014    Procedure: RELEASE A-1 PULLEY RIGHT RING FINGER;  Surgeon: Cindee SaltGary Kuzma, MD;  Location: [facility];  Service: Orthopedics;  Laterality: Right;  ANESTHESIA: IV REGIONAL FAB   No family history on file. Social History  Substance Use Topics  . Smoking status: Never Smoker   . Smokeless tobacco: Never Used  . Alcohol Use: No   no drug use OB History    No data available     Review of Systems  Eyes: Positive for visual disturbance.       Blurred vision for one week since fall  Cardiovascular:       Tachycardia. Patient states her baseline pulse rate is 125  Gastrointestinal: Positive for nausea.  Musculoskeletal: Positive for gait problem.  Neurological: Positive for tremors, seizures and headaches.  All other systems reviewed and are negative.     Allergies  Cephalexin; Cephalosporins; Codeine; Morphine and related; Pentazocine lactate; Prednisone; and Rocephin  Home Medications   Prior to Admission medications   Medication Sig Start Date End Date Taking? Authorizing Provider  amitriptyline (ELAVIL) 100 MG tablet Take 100 mg by mouth at bedtime.   Yes Historical Provider, MD  atorvastatin (LIPITOR) 10 MG tablet Take 10 mg by mouth daily. Takes 1/2 tab daily (5mg )   Yes Historical Provider, MD  Cyanocobalamin (VITAMIN B 12 PO) Take 5,000 mcg by mouth 2 (two) times daily.  Yes Historical Provider, MD  Multiple Vitamins-Minerals (MULTIVITAMIN WITH MINERALS) tablet Take 2 tablets by mouth daily.     Yes Historical Provider, MD  zolpidem (AMBIEN) 10 MG tablet Take 10 mg by mouth daily as needed for sleep.    Yes Historical Provider, MD  chlorhexidine (HIBICLENS) 4 % external liquid Apply 60 mLs (4 application total) topically once. Patient not taking: Reported on 06/18/2015 06/24/14   Cindee Salt, MD  sulfamethoxazole-trimethoprim (BACTRIM,SEPTRA) 200-40 MG/5ML suspension Take 20 mLs by mouth 2 (two) times daily. Patient not taking: Reported  on 06/18/2015 05/19/14   Elson Areas, PA-C  traMADol (ULTRAM) 50 MG tablet Take 1 tablet (50 mg total) by mouth every 6 (six) hours as needed. Patient not taking: Reported on 06/18/2015 06/24/14   Cindee Salt, MD   patient is currently not on tramadol or Bactrim BP 126/78 mmHg  Pulse 118  Temp(Src) 99.3 F (37.4 C) (Oral)  Resp 19  SpO2 97% Physical Exam  Constitutional: She is oriented to person, place, and time. She appears well-developed and well-nourished. No distress.  Alert Glasgow Coma Score 15  HENT:  Head: Normocephalic and atraumatic.  Eyes: Conjunctivae are normal. Pupils are equal, round, and reactive to light.  Neck: Neck supple. No tracheal deviation present. No thyromegaly present.  Cardiovascular: Regular rhythm.   No murmur heard. Mildly tachycardic  Pulmonary/Chest: Effort normal and breath sounds normal.  Abdominal: Soft. Bowel sounds are normal. She exhibits no distension. There is no tenderness.  Musculoskeletal: Normal range of motion. She exhibits no edema or tenderness.  Tender along the cervical spine diffusely. No tenderness at thoracic spine or lumbar spine. Pelvis stable and nontender. All 4 extremities without contusion abrasion or tenderness neurovascularly intact  Neurological: She is alert and oriented to person, place, and time. She displays normal reflexes. No cranial nerve deficit. Coordination normal.  Mild tremors of both hands. Motor strength 5 over 5 overall. DTRs symmetric bilaterally at knee jerk and ankle jerk and biceps toes downward going bilaterally  Skin: Skin is warm and dry. No rash noted.  Psychiatric: She has a normal mood and affect.  Nursing note and vitals reviewed.   ED Course  Procedures (including critical care time) Labs Review Labs Reviewed  BASIC METABOLIC PANEL  CBC  CBG MONITORING, ED    Imaging Review No results found. I have personally reviewed and evaluated these images and lab results as part of my medical  decision-making.   EKG Interpretation None     ED ECG REPORT   Date: 06/18/2015  Rate: 120  Rhythm: sinus tachycardia  QRS Axis: normal  Intervals: normal  ST/T Wave abnormalities: nonspecific T wave changes  Conduction Disutrbances:none  Narrative Interpretation:   Old EKG Reviewed: Rate increased over 03/23/2005  I have personally reviewed the EKG tracing and disagree with the computerized printout as noted. No abnormal inferior Q waves, doubt old infarct 10:40 PM patient is alert Glasgow Coma Score 15. Complains of mild headache. She is alert and a little difficulty Glasgow Coma Score 15 Tylenol ordered. Results for orders placed or performed during the hospital encounter of 06/18/15  Basic metabolic panel - if new onset seizures  Result Value Ref Range   Sodium 136 135 - 145 mmol/L   Potassium 4.7 3.5 - 5.1 mmol/L   Chloride 103 101 - 111 mmol/L   CO2 25 22 - 32 mmol/L   Glucose, Bld 110 (H) 65 - 99 mg/dL   BUN 6 6 - 20 mg/dL  Creatinine, Ser 1.00 0.44 - 1.00 mg/dL   Calcium 9.3 8.9 - 96.0 mg/dL   GFR calc non Af Amer >60 >60 mL/min   GFR calc Af Amer >60 >60 mL/min   Anion gap 8 5 - 15  CBC - if new onset seizures  Result Value Ref Range   WBC 7.4 4.0 - 10.5 K/uL   RBC 3.80 (L) 3.87 - 5.11 MIL/uL   Hemoglobin 11.0 (L) 12.0 - 15.0 g/dL   HCT 45.4 (L) 09.8 - 11.9 %   MCV 91.3 78.0 - 100.0 fL   MCH 28.9 26.0 - 34.0 pg   MCHC 31.7 30.0 - 36.0 g/dL   RDW 14.7 82.9 - 56.2 %   Platelets 234 150 - 400 K/uL  Urine rapid drug screen (hosp performed)  Result Value Ref Range   Opiates NONE DETECTED NONE DETECTED   Cocaine NONE DETECTED NONE DETECTED   Benzodiazepines NONE DETECTED NONE DETECTED   Amphetamines POSITIVE (A) NONE DETECTED   Tetrahydrocannabinol NONE DETECTED NONE DETECTED   Barbiturates NONE DETECTED NONE DETECTED  TSH  Result Value Ref Range   TSH 4.062 0.350 - 4.500 uIU/mL   Ct Head Wo Contrast  06/18/2015  CLINICAL DATA:  New onset of seizures  which lasted 2-3 minutes. Apparent head trauma. Assess for cervical spine injury. EXAM: CT HEAD WITHOUT CONTRAST CT CERVICAL SPINE WITHOUT CONTRAST TECHNIQUE: Multidetector CT imaging of the head and cervical spine was performed following the standard protocol without intravenous contrast. Multiplanar CT image reconstructions of the cervical spine were also generated. COMPARISON:  MR brain 03/09/2009. FINDINGS: CT HEAD FINDINGS No evidence for acute infarction, hemorrhage, mass lesion, hydrocephalus, or extra-axial fluid. No atrophy or white matter disease. Intact calvarium. No acute sinus or mastoid disease. Mild vascular calcification. In the LEFT parietal parasagittal scalp region there is a large hematoma. There is no underlying skull fracture or contrecoup injury. Compared with prior MR brain, no intracranial abnormality was present at that time nor is there today, to suggest a cause for the patient's epileptiform activity. CT CERVICAL SPINE FINDINGS There is no visible cervical spine fracture, traumatic subluxation, prevertebral soft tissue swelling, or intraspinal hematoma. Intervertebral disc spaces are preserved. C1-C2 articulation is anatomic. No neck masses. No lung apex lesions. IMPRESSION: CT head is negative for stroke, bleed, mass, or other acute intracranial finding. Large LEFT parietal scalp hematoma without skull fracture or extra-axial fluid collection. No cervical spine fracture or traumatic subluxation. Electronically Signed   By: Elsie Stain M.D.   On: 06/18/2015 17:19   Ct Cervical Spine Wo Contrast  06/18/2015  CLINICAL DATA:  New onset of seizures which lasted 2-3 minutes. Apparent head trauma. Assess for cervical spine injury. EXAM: CT HEAD WITHOUT CONTRAST CT CERVICAL SPINE WITHOUT CONTRAST TECHNIQUE: Multidetector CT imaging of the head and cervical spine was performed following the standard protocol without intravenous contrast. Multiplanar CT image reconstructions of the cervical  spine were also generated. COMPARISON:  MR brain 03/09/2009. FINDINGS: CT HEAD FINDINGS No evidence for acute infarction, hemorrhage, mass lesion, hydrocephalus, or extra-axial fluid. No atrophy or white matter disease. Intact calvarium. No acute sinus or mastoid disease. Mild vascular calcification. In the LEFT parietal parasagittal scalp region there is a large hematoma. There is no underlying skull fracture or contrecoup injury. Compared with prior MR brain, no intracranial abnormality was present at that time nor is there today, to suggest a cause for the patient's epileptiform activity. CT CERVICAL SPINE FINDINGS There is no visible cervical spine  fracture, traumatic subluxation, prevertebral soft tissue swelling, or intraspinal hematoma. Intervertebral disc spaces are preserved. C1-C2 articulation is anatomic. No neck masses. No lung apex lesions. IMPRESSION: CT head is negative for stroke, bleed, mass, or other acute intracranial finding. Large LEFT parietal scalp hematoma without skull fracture or extra-axial fluid collection. No cervical spine fracture or traumatic subluxation. Electronically Signed   By: Elsie Stain M.D.   On: 06/18/2015 17:19   Mr Laqueta Jean IO Contrast  06/18/2015  CLINICAL DATA:  Initial evaluation for acute seizure. EXAM: MRI HEAD WITHOUT AND WITH CONTRAST TECHNIQUE: Multiplanar, multiecho pulse sequences of the brain and surrounding structures were obtained without and with intravenous contrast. CONTRAST:  14mL MULTIHANCE GADOBENATE DIMEGLUMINE 529 MG/ML IV SOLN COMPARISON:  Prior CT from earlier the same day. FINDINGS: The CSF containing spaces are within normal limits for patient age. No focal parenchymal signal abnormality is identified. No mass lesion, midline shift, or extra-axial fluid collection. Ventricles are normal in size without evidence of hydrocephalus. No diffusion-weighted signal abnormality is identified to suggest acute intracranial infarct. Gray-white matter  differentiation is maintained. Normal flow voids are seen within the intracranial vasculature. No intracranial hemorrhage identified. The cervicomedullary junction is normal. Pituitary gland is within normal limits. Pituitary stalk is midline. The globes and optic nerves demonstrate a normal appearance with normal signal intensity. No abnormal enhancement. The bone marrow signal intensity is normal. Calvarium is intact. Visualized upper cervical spine is within normal limits. Large left parietal scalp contusion again noted. Paranasal sinuses are clear.  Small right mastoid effusion. IMPRESSION: 1. Normal brain MRI with no acute intracranial process identified. 2. Left parietal scalp contusion. 3. Small right mastoid effusion. Electronically Signed   By: Rise Mu M.D.   On: 06/18/2015 22:04    MDM   I spoke with Dr. Cyril Mourning who suggested MRI scan of brain with contrast. If negative patient to be discharged with follow-up as outpatient to neurology office  Patient is felt to have concussion as a result of fall last week, developed seizure today. Has postconcussive headache.  No driving advised. No anticonvulsant ordered in lightof first seizure  Diagnosis #1 new onset seizure  Diagnosis #2 postconcussive syndrome  Final diagnoses:  None        Doug Sou, MD 06/18/15 2246

## 2015-06-18 NOTE — ED Notes (Signed)
Family at bedside. 

## 2015-06-18 NOTE — Discharge Instructions (Signed)
Seizure, Adult [facility] neurology office should call you within the next few days to schedule appointment. If you don't hear from them by Monday, 06/22/2015 call to schedule appointment and tell office staff that you're seen here for a seizure. No driving or operating heavy machinery until evaluated and cleared by the neurologist A seizure means there is unusual activity in the brain. A seizure can cause changes in attention or behavior. Seizures often cause shaking (convulsions). Seizures often last from 30 seconds to 2 minutes. HOME CARE   If you are given medicines, take them exactly as told by your doctor.  Keep all doctor visits as told.  Do not swim or drive until your doctor says it is okay.  Teach others what to do if you have a seizure. They should:  Lay you on the ground.  Put a cushion under your head.  Loosen any tight clothing around your neck.  Turn you on your side.  Stay with you until you get better. GET HELP RIGHT AWAY IF:   The seizure lasts longer than 2 to 5 minutes.  The seizure is very bad.  The person does not wake up after the seizure.  The person's attention or behavior changes. Drive the person to the emergency room or call your local emergency services (911 in [place] MAKE SURE YOU:   Understand these instructions.  Will watch your condition.  Will get help right away if you are not doing well or get worse.   This information is not intended to replace advice given to you by your health care provider. Make sure you discuss any questions you have with your health care provider.   Document Released: 02/01/2008 Document Revised: 11/07/2011 Document Reviewed: 03/27/2013 Elsevier Interactive Patient Education [employer]2016 [employer].

## 2015-06-18 NOTE — ED Notes (Signed)
Pt on bedside commode.

## 2015-06-18 NOTE — ED Notes (Signed)
Pt arrives from [facility] via [facility]. Pt had a seizure lasting about 3-4 min, tonic clonic, prior to seizure pt became dizzy began stumbling backwards and shaking, pt fell and hit back of head. Pt states she has been dizzy since Monday and has had multiple falls with unknown cause. Pt did have postictal incontinence. Pt Alert to person on scene, mention increased in route. BP 140/60 HR120 [id number]. Pt does not have any prior hx of seizures.

## 2015-06-18 NOTE — ED Notes (Signed)
Patient transported to MRI 

## 2015-06-18 NOTE — ED Notes (Signed)
Patient transported to CT 

## 2015-06-29 ENCOUNTER — Ambulatory Visit: Payer: Self-pay | Admitting: Neurology

## 2015-07-01 ENCOUNTER — Ambulatory Visit: Payer: Self-pay | Admitting: Neurology

## 2015-08-16 ENCOUNTER — Emergency Department (HOSPITAL_COMMUNITY)
Admission: EM | Admit: 2015-08-16 | Discharge: 2015-08-16 | Disposition: A | Discharge status: Home / Self Care | Payer: 59 | Attending: Emergency Medicine | Admitting: Emergency Medicine

## 2015-08-16 ENCOUNTER — Encounter (HOSPITAL_COMMUNITY): Payer: Self-pay | Admitting: Emergency Medicine

## 2015-08-16 DIAGNOSIS — M1990 Unspecified osteoarthritis, unspecified site: Secondary | ICD-10-CM | POA: Insufficient documentation

## 2015-08-16 DIAGNOSIS — I10 Essential (primary) hypertension: Secondary | ICD-10-CM | POA: Insufficient documentation

## 2015-08-16 DIAGNOSIS — E785 Hyperlipidemia, unspecified: Secondary | ICD-10-CM | POA: Insufficient documentation

## 2015-08-16 DIAGNOSIS — F1510 Other stimulant abuse, uncomplicated: Secondary | ICD-10-CM | POA: Insufficient documentation

## 2015-08-16 DIAGNOSIS — Z8709 Personal history of other diseases of the respiratory system: Secondary | ICD-10-CM | POA: Insufficient documentation

## 2015-08-16 DIAGNOSIS — Z79899 Other long term (current) drug therapy: Secondary | ICD-10-CM | POA: Insufficient documentation

## 2015-08-16 DIAGNOSIS — R000 Tachycardia, unspecified: Secondary | ICD-10-CM | POA: Insufficient documentation

## 2015-08-16 DIAGNOSIS — R569 Unspecified convulsions: Secondary | ICD-10-CM | POA: Insufficient documentation

## 2015-08-16 DIAGNOSIS — Z9884 Bariatric surgery status: Secondary | ICD-10-CM | POA: Insufficient documentation

## 2015-08-16 DIAGNOSIS — Z8719 Personal history of other diseases of the digestive system: Secondary | ICD-10-CM | POA: Insufficient documentation

## 2015-08-16 DIAGNOSIS — G43909 Migraine, unspecified, not intractable, without status migrainosus: Secondary | ICD-10-CM | POA: Insufficient documentation

## 2015-08-16 LAB — COMPREHENSIVE METABOLIC PANEL
ALK PHOS: 73 U/L (ref 38–126)
ALT: 23 U/L (ref 14–54)
AST: 26 U/L (ref 15–41)
Albumin: 3.4 g/dL — ABNORMAL LOW (ref 3.5–5.0)
Anion gap: 9 (ref 5–15)
BILIRUBIN TOTAL: 0.2 mg/dL — AB (ref 0.3–1.2)
BUN: 6 mg/dL (ref 6–20)
CALCIUM: 8.5 mg/dL — AB (ref 8.9–10.3)
CO2: 24 mmol/L (ref 22–32)
CREATININE: 0.97 mg/dL (ref 0.44–1.00)
Chloride: 105 mmol/L (ref 101–111)
GFR calc Af Amer: >60 mL/min (ref >60)
GLUCOSE: 101 mg/dL — AB (ref 65–99)
Potassium: 4 mmol/L (ref 3.5–5.1)
Sodium: 138 mmol/L (ref 135–145)
Total Protein: 6.3 g/dL — ABNORMAL LOW (ref 6.5–8.1)

## 2015-08-16 LAB — CBC WITH DIFFERENTIAL/PLATELET
BASOS ABS: 0 10*3/uL (ref 0.0–0.1)
Basophils Relative: 0 %
Eosinophils Absolute: 0.1 10*3/uL (ref 0.0–0.7)
Eosinophils Relative: 3 %
HEMATOCRIT: 33.5 % — AB (ref 36.0–46.0)
HEMOGLOBIN: 11.1 g/dL — AB (ref 12.0–15.0)
LYMPHS PCT: 18 %
Lymphs Abs: 1 10*3/uL (ref 0.7–4.0)
MCH: 30.5 pg (ref 26.0–34.0)
MCHC: 33.1 g/dL (ref 30.0–36.0)
MCV: 92 fL (ref 78.0–100.0)
MONO ABS: 0.4 10*3/uL (ref 0.1–1.0)
MONOS PCT: 7 %
NEUTROS ABS: 4.1 10*3/uL (ref 1.7–7.7)
Neutrophils Relative %: 72 %
Platelets: 172 10*3/uL (ref 150–400)
RBC: 3.64 MIL/uL — ABNORMAL LOW (ref 3.87–5.11)
RDW: 14.4 % (ref 11.5–15.5)
WBC: 5.6 10*3/uL (ref 4.0–10.5)

## 2015-08-16 LAB — RAPID URINE DRUG SCREEN, HOSP PERFORMED
AMPHETAMINES: POSITIVE — AB
BARBITURATES: NOT DETECTED
Benzodiazepines: NOT DETECTED
Cocaine: NOT DETECTED
Opiates: NOT DETECTED
TETRAHYDROCANNABINOL: NOT DETECTED

## 2015-08-16 LAB — URINALYSIS, ROUTINE W REFLEX MICROSCOPIC
BILIRUBIN URINE: NEGATIVE
GLUCOSE, UA: NEGATIVE mg/dL
Hgb urine dipstick: NEGATIVE
KETONES UR: NEGATIVE mg/dL
LEUKOCYTES UA: NEGATIVE
Nitrite: NEGATIVE
PH: 5.5 (ref 5.0–8.0)
Protein, ur: NEGATIVE mg/dL
SPECIFIC GRAVITY, URINE: 1.012 (ref 1.005–1.030)

## 2015-08-16 MED ORDER — LEVETIRACETAM 500 MG PO TABS: 500.0000 mg | ORAL_TABLET | Freq: Two times a day (BID) | ORAL | Status: DC

## 2015-08-16 MED ORDER — SODIUM CHLORIDE 0.9 % IV SOLN
1000.0000 mg | Freq: Once | INTRAVENOUS | Status: AC
  Administered 2015-08-16: 1000 mg via INTRAVENOUS
  Filled 2015-08-16: qty 10

## 2015-08-16 NOTE — Discharge Instructions (Signed)
Take Keppra as prescribed for seizure-like activity. Follow-up with a neurologist as soon as you are able. Do not drive a vehicle until you are cleared to do so by a neurologist. Follow up with your primary care doctor in 1 week. Return to the ED as needed if symptoms worsen.  Seizure, Adult A seizure is abnormal electrical activity in the brain. Seizures usually last from 30 seconds to 2 minutes. There are various types of seizures. Before a seizure, you may have a warning sensation (aura) that a seizure is about to occur. An aura may include the following symptoms:   Fear or anxiety.  Nausea.  Feeling like the room is spinning (vertigo).  Vision changes, such as seeing flashing lights or spots. Common symptoms during a seizure include:  A change in attention or behavior (altered mental status).  Convulsions with rhythmic jerking movements.  Drooling.  Rapid eye movements.  Grunting.  Loss of bladder and bowel control.  Bitter taste in the mouth.  Tongue biting. After a seizure, you may feel confused and sleepy. You may also have an injury resulting from convulsions during the seizure. HOME CARE INSTRUCTIONS   If you are given medicines, take them exactly as prescribed by your health care provider.  Keep all follow-up appointments as directed by your health care provider.  Do not swim or drive or engage in risky activity during which a seizure could cause further injury to you or others until your health care provider says it is OK.  Get adequate rest.  Teach friends and family what to do if you have a seizure. They should:  Lay you on the ground to prevent a fall.  Put a cushion under your head.  Loosen any tight clothing around your neck.  Turn you on your side. If vomiting occurs, this helps keep your airway clear.  Stay with you until you recover.  Know whether or not you need emergency care. SEEK IMMEDIATE MEDICAL CARE IF:  The seizure lasts longer than 5  minutes.  The seizure is severe or you do not wake up immediately after the seizure.  You have an altered mental status after the seizure.  You are having more frequent or worsening seizures. Someone should drive you to the emergency department or call local emergency services (911 in [place] MAKE SURE YOU:  Understand these instructions.  Will watch your condition.  Will get help right away if you are not doing well or get worse.   This information is not intended to replace advice given to you by your health care provider. Make sure you discuss any questions you have with your health care provider.   Document Released: 08/12/2000 Document Revised: 09/05/2014 Document Reviewed: 03/27/2013 Elsevier Interactive Patient Education [employer]2016 Elsevier Inc.

## 2015-08-16 NOTE — ED Notes (Signed)
Pt left at this time with all belongings.  

## 2015-08-16 NOTE — ED Notes (Signed)
Per [facility] they were called to residence after pt experienced a 30 second seizure w/ full convulsions.  Pt experienced her first seizure a month and 1/2 ago.  EMS found her confused but alert, family reported to them that she had complained of a headache earlier in the day and "was not acting like herself."

## 2015-08-16 NOTE — ED Provider Notes (Signed)
CSN: [id number]     Arrival date & time 08/16/15  0043 History   First MD Initiated Contact with Patient 08/16/15 0049     Chief Complaint  Patient presents with  . Seizures     (Consider location/radiation/quality/duration/timing/severity/associated sxs/prior Treatment) HPI Comments: Patient is a 39 year old female with a history of dyslipidemia, migraine headaches, arthritis, and hypertension. She presents to the emergency department after seizure-like activity. Has a history of seizure-like activity on 06/18/2015. She presented to the emergency department and had a reassuring workup with negative CT scan and MRI. Patient was discharged with instruction of follow-up with neurology. She states that she canceled her follow-up appointment and has not seen a neurologist since her last ED visit. She states that she has been driving despite being placed on driving precautions. She has no recollection of the seizure activity which occurred prior to arrival. She has no complaints of pain and denies any known incontinence. No headache or fever.  Son at bedside reports that patient was sitting on the floor in their living room holding their infant nephew. Son reports that patient appeared to be getting more confused and disoriented when he then noticed that her b/l upper extremities started shaking. Son believes the patient's eyes rolled up and to the left. He is unsure of any BLE shaking or incontinence. Son reports seizure activity lasting ~45 seconds. Patient appeared post ictal until EMS arrival, about 45 minutes later. No reported cyanosis. No known FHx of seizures.  Patient is a 39 y.o. female presenting with seizures. The history is provided by the patient and a relative. No language interpreter was used.  Seizures   Past Medical History  Diagnosis Date  . Hyperlipidemia   . Headache, migraine   . Wears glasses   . Incontinence   . Headaches, cluster   . Plantar fasciitis   . Lumbago   .  Bruises easily   . Sinus problem   . Wears glasses     reading glasses only  . Arthritis     foot and knee  . Sore throat   . 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 Hypertension     resolved 5 yrs ago  . GERD (gastroesophageal reflux disease)     resolved 5 yrs ago   Past Surgical History  Procedure Laterality Date  . Gastric bypass  03/14/11    lap. Roux-en-Y  . Tubal ligation  1999  . Carpal tunnel release Right 02/02/2011  . Carpal tunnel release Left 01/22/2014    Procedure: LEFT CARPAL TUNNEL RELEASE;  Surgeon: Nicki Reaper, MD;  Location: [facility];  Service: Orthopedics;  Laterality: Left;  . Plantar fascia release Left 08/25/2009  . Plantar fascia release Right 01/02/2004  . Cholecystectomy  08/06/2004  . Hysteroscopy with novasure  10/15/2004  . Esophagogastroduodenoscopy  03/14/2011  . Trigger finger release Right 06/24/2014    Procedure: RELEASE A-1 PULLEY RIGHT RING FINGER;  Surgeon: Cindee Salt, MD;  Location: [facility];  Service: Orthopedics;  Laterality: Right;  ANESTHESIA: IV REGIONAL FAB   No family history on file. Social History  Substance Use Topics  . Smoking status: Never Smoker   . Smokeless tobacco: Never Used  . Alcohol Use: No   OB History    No data available      Review of Systems  Constitutional: Negative for fever.  Cardiovascular: Negative for chest pain.  Gastrointestinal: Negative for vomiting and abdominal  pain.  Genitourinary:       Negative for incontinence  Neurological: Positive for seizures.  All other systems reviewed and are negative.   Allergies  Cephalexin; Cephalosporins; Codeine; Morphine and related; Pentazocine lactate; Prednisone; and Rocephin  Home Medications   Prior to Admission medications   Medication Sig Start Date End Date Taking? Authorizing Provider  amitriptyline (ELAVIL) 100 MG tablet Take 100 mg by mouth at bedtime.   Yes Historical Provider, MD   atorvastatin (LIPITOR) 10 MG tablet Take 10 mg by mouth daily. Takes 1/2 tab daily (5mg )   Yes Historical Provider, MD  Cyanocobalamin (VITAMIN B 12 PO) Take 5,000 mcg by mouth 2 (two) times daily.   Yes Historical Provider, MD  Multiple Vitamins-Minerals (MULTIVITAMIN WITH MINERALS) tablet Take 2 tablets by mouth daily.     Yes Historical Provider, MD  zolpidem (AMBIEN) 10 MG tablet Take 10 mg by mouth daily as needed for sleep.    Yes Historical Provider, MD  chlorhexidine (HIBICLENS) 4 % external liquid Apply 60 mLs (4 application total) topically once. Patient not taking: Reported on 06/18/2015 06/24/14   Cindee Salt, MD  levETIRAcetam (KEPPRA) 500 MG tablet Take 1 tablet (500 mg total) by mouth 2 (two) times daily. 08/16/15   Antony Madura, PA-C  sulfamethoxazole-trimethoprim (BACTRIM,SEPTRA) 200-40 MG/5ML suspension Take 20 mLs by mouth 2 (two) times daily. Patient not taking: Reported on 06/18/2015 05/19/14   Elson Areas, PA-C  traMADol (ULTRAM) 50 MG tablet Take 1 tablet (50 mg total) by mouth every 6 (six) hours as needed. Patient not taking: Reported on 06/18/2015 06/24/14   Cindee Salt, MD   BP 97/61 mmHg  Pulse 86  Temp(Src) 97.8 F (36.6 C) (Oral)  Resp 21  Ht 5\' 4"  (1.626 m)  SpO2 99%   Physical Exam  Constitutional: She is oriented to person, place, and time. She appears well-developed and well-nourished. No distress.  Nontoxic/nonseptic appearing.  HENT:  Head: Normocephalic and atraumatic.  Mouth/Throat: Oropharynx is clear and moist. No oropharyngeal exudate.  No facial drooping. Symmetric rise of the uvula with phonation. Tongue midline. No oral trauma.  Eyes: Conjunctivae and EOM are normal. Pupils are equal, round, and reactive to light. No scleral icterus.  Neck: Normal range of motion.  No nuchal rigidity or meningismus  Cardiovascular: Regular rhythm and intact distal pulses.   Mild tachycardia  Pulmonary/Chest: Effort normal and breath sounds normal. No  respiratory distress. She has no wheezes. She has no rales.  Respirations even and unlabored  Musculoskeletal: Normal range of motion.  Neurological: She is alert and oriented to person, place, and time.  Patient appears slightly computes, though she is alert and oriented 3. No focal neurologic deficits appreciated. Equal grip strength bilaterally. Strength against resistance 5/5. Sensation to light touch intact in all extremities.  Skin: Skin is warm and dry. No rash noted. She is not diaphoretic. No erythema. No pallor.  Psychiatric: She has a normal mood and affect. Her speech is normal and behavior is normal.  Nursing note and vitals reviewed.   ED Course  Procedures (including critical care time) Labs Review Labs Reviewed  CBC WITH DIFFERENTIAL/PLATELET - Abnormal; Notable for the following:    RBC 3.64 (*)    Hemoglobin 11.1 (*)    HCT 33.5 (*)    All other components within normal limits  COMPREHENSIVE METABOLIC PANEL - Abnormal; Notable for the following:    Glucose, Bld 101 (*)    Calcium 8.5 (*)    Total  Protein 6.3 (*)    Albumin 3.4 (*)    Total Bilirubin 0.2 (*)    All other components within normal limits  URINE RAPID DRUG SCREEN, HOSP PERFORMED - Abnormal; Notable for the following:    Amphetamines POSITIVE (*)    All other components within normal limits  URINALYSIS, ROUTINE W REFLEX MICROSCOPIC (NOT AT [facility])    Imaging Review CLINICAL DATA: Initial evaluation for acute seizure.  EXAM 06/18/15: MRI HEAD WITHOUT AND WITH CONTRAST  TECHNIQUE: Multiplanar, multiecho pulse sequences of the brain and surrounding structures were obtained without and with intravenous contrast.  CONTRAST: 14mL MULTIHANCE GADOBENATE DIMEGLUMINE 529 MG/ML IV SOLN  COMPARISON: Prior CT from earlier the same day.  FINDINGS: The CSF containing spaces are within normal limits for patient age. No focal parenchymal signal abnormality is identified. No mass lesion, midline  shift, or extra-axial fluid collection. Ventricles are normal in size without evidence of hydrocephalus.  No diffusion-weighted signal abnormality is identified to suggest acute intracranial infarct. Gray-white matter differentiation is maintained. Normal flow voids are seen within the intracranial vasculature. No intracranial hemorrhage identified.  The cervicomedullary junction is normal. Pituitary gland is within normal limits. Pituitary stalk is midline. The globes and optic nerves demonstrate a normal appearance with normal signal intensity.  No abnormal enhancement.  The bone marrow signal intensity is normal. Calvarium is intact. Visualized upper cervical spine is within normal limits.  Large left parietal scalp contusion again noted.  Paranasal sinuses are clear. Small right mastoid effusion.  IMPRESSION: 1. Normal brain MRI with no acute intracranial process identified. 2. Left parietal scalp contusion. 3. Small right mastoid effusion.   Electronically Signed  By: Rise Mu M.D.  On: 06/18/2015 22:04  I have personally reviewed and evaluated these images and lab results as part of my medical decision-making.   EKG Interpretation   Date/Time:  Sunday August 16 2015 01:28:34 EST Ventricular Rate:  102 PR Interval:  180 QRS Duration: 117 QT Interval:  349 QTC Calculation: 455 R Axis:   78 Text Interpretation:  Sinus tachycardia Nonspecific intraventricular  conduction delay No significant change since last tracing Confirmed by  POLLINA  MD, CHRISTOPHER [phone]) on 08/16/2015 2:17:01 AM      MDM   Final diagnoses:  Seizure-like activity (HCC)    39 year old female presents to the emergency department for seizure-like activity. She has a history of seizure-like activity back in October. Patient was instructed to follow-up with neurology, but she neglected to do so. Patient with a nonfocal neurologic exam on arrival. No evidence of  oral trauma. No reported incontinence. She is afebrile.  MRI from prior visit reviewed which shows no acute abnormalities. Laboratory workup today is noncontributory. Her UDS is positive for amphetamines. This was also the case back in October. It is possible that amphetamines may be positive from daily medication, though no medications are listed which would cause her UDS to be positive for amphetamines today.  Given recurrent seizure-like activity, case discussed with Dr. Roseanne Reno of neurology. He recommends starting the patient on Keppra 500 mg twice a day following a loading dose of 1 g Keppra IV in the emergency department. I have explained to the patient's husband and the patient, herself, that she must follow-up with neurology for further evaluation of her symptoms. Have discussed that neurology would be able to perform an EEG to further evaluate patient's seizure-like activity. Has been verbalizes understanding. Patient placed back on driving precautions. No indication for further emergent workup at this time.  Patient discharged in satisfactory condition; return precautions given.   Filed Vitals:   08/16/15 0145 08/16/15 0230 08/16/15 0345 08/16/15 0500  BP: 119/79 103/64 101/63 97/61  Pulse: 96 91 85 86  Temp:      TempSrc:      Resp: 15 13 12 21   Height:      SpO2: 100% 98% 95% 99%     Antony MaduraKelly Belenda Alviar, PA-C 08/16/15 0524  Gilda Creasehristopher J Pollina, MD 08/16/15 [phone]

## 2015-09-30 ENCOUNTER — Encounter: Payer: Self-pay | Admitting: *Deleted

## 2015-10-01 ENCOUNTER — Encounter: Payer: Self-pay | Admitting: Diagnostic Neuroimaging

## 2015-10-01 ENCOUNTER — Ambulatory Visit (INDEPENDENT_AMBULATORY_CARE_PROVIDER_SITE_OTHER): Payer: BLUE CROSS/BLUE SHIELD | Admitting: Diagnostic Neuroimaging

## 2015-10-01 VITALS — BP 117/78 | HR 109 | Ht 64.0 in | Wt 164.0 lb

## 2015-10-01 DIAGNOSIS — G40909 Epilepsy, unspecified, not intractable, without status epilepticus: Secondary | ICD-10-CM | POA: Diagnosis not present

## 2015-10-01 MED ORDER — LEVETIRACETAM 500 MG PO TABS: 500.0000 mg | ORAL_TABLET | Freq: Two times a day (BID) | ORAL | Status: DC

## 2015-10-01 NOTE — Patient Instructions (Signed)
  Thank you for coming to see [place] at [facility]. I hope we have been able to provide you high quality care today.  You may receive a patient satisfaction survey over the next few weeks. We would appreciate your feedback and comments so that we may continue to improve ourselves and the health of our patients.  - see epilepsy.com website for more information.  - According to [place] law, you can not drive unless you are seizure free for at least 6 months and under physician's care.   - Please maintain seizure precautions. Do not participate in activities where a loss of awareness could harm you or someone else. No swimming alone, no tub bathing, no hot tubs, no driving, no operating motorized vehicles (cars, ATVs, motocycles, etc), lawnmowers or power tools. No standing at heights, such as rooftops, ladders or stairs. Avoid hot objects such as stoves, heaters, open fires. Wear a helmet when riding a bicycle, scooter, skateboard, etc. and avoid areas of traffic. Set your water heater to 120 degrees or less.  - continue levetiracetam 538m twice a day    ~~~~~~~~~~~~~~~~~~~~~~~~~~~~~~~~~~~~~~~~~~~~~~~~~~~~~~~~~~~~~~~~~  DR. PENUMALLI'S GUIDE TO HAPPY AND HEALTHY LIVING These are some of my general health and wellness recommendations. Some of them may apply to you better than others. Please use common sense as you try these suggestions and feel free to ask me any questions.   ACTIVITY/FITNESS Mental, social, emotional and physical stimulation are very important for brain and body health. Try learning a new activity (arts, music, language, sports, games).  Keep moving your body to the best of your abilities. You can do this at home, inside or outside, the park, community center, gym or anywhere you like. Consider a physical therapist or personal trainer to get started. Consider the app Sworkit. Fitness trackers such as smart-watches, smart-phones or Fitbits can help as  well.   NUTRITION Eat more plants: colorful vegetables, nuts, seeds and berries.  Eat less sugar, salt, preservatives and processed foods.  Avoid toxins such as cigarettes and alcohol.  Drink water when you are thirsty. Warm water with a slice of lemon is an excellent morning drink to start the day.  Consider these websites for more information The Nutrition Source (hhttps://www.henry-hernandez.biz/ Prec[employer] Nutrition (wWindowBlog.ch   RELAXATION Consider practicing mindfulness meditation or other relaxation techniques such as deep breathing, prayer, yoga, tai chi, massage. See website mindful.org or the apps Headspace or Calm to help get started.   SLEEP Try to get at least 7-8+ hours sleep per day. Regular exercise and reduced caffeine will help you sleep better. Practice good sleep hygeine techniques. See website sleep.org for more information.   PLANNING Prepare estate planning, living will, healthcare POA documents. Sometimes this is best planned with the help of an attorney. Theconversationproject.org and agingwithdignity.org are excellent resources.
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
## 2015-12-29 ENCOUNTER — Ambulatory Visit: Payer: BLUE CROSS/BLUE SHIELD | Admitting: Diagnostic Neuroimaging

## 2015-12-30 ENCOUNTER — Encounter: Payer: Self-pay | Admitting: Diagnostic Neuroimaging

## 2016-01-29 DIAGNOSIS — Z0289 Encounter for other administrative examinations: Secondary | ICD-10-CM

## 2016-02-11 ENCOUNTER — Ambulatory Visit: Payer: BLUE CROSS/BLUE SHIELD | Admitting: Diagnostic Neuroimaging

## 2016-02-15 ENCOUNTER — Encounter: Payer: Self-pay | Admitting: Diagnostic Neuroimaging

## 2016-02-15 ENCOUNTER — Ambulatory Visit (INDEPENDENT_AMBULATORY_CARE_PROVIDER_SITE_OTHER): Payer: BLUE CROSS/BLUE SHIELD | Admitting: Diagnostic Neuroimaging

## 2016-02-15 VITALS — BP 121/84 | HR 113 | Ht 64.0 in | Wt 172.0 lb

## 2016-02-15 DIAGNOSIS — G40909 Epilepsy, unspecified, not intractable, without status epilepticus: Secondary | ICD-10-CM

## 2016-02-15 MED ORDER — LEVETIRACETAM 750 MG PO TABS: 750.0000 mg | ORAL_TABLET | Freq: Two times a day (BID) | ORAL | Status: DC
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
## 2016-02-16 ENCOUNTER — Telehealth: Payer: Self-pay | Admitting: *Deleted

## 2016-02-16 NOTE — Telephone Encounter (Signed)
[facility] [employer] [facility]

## 2016-02-19 NOTE — Telephone Encounter (Signed)
Patient called to check status of [employer] medical accommodations form. Please call [phone].

## 2016-02-19 NOTE — Telephone Encounter (Signed)
LVM informing patient her [employer] papers of accommodation are completed and faxed as requested. Advised the office is now closed, opens Mon. Left name, number.

## 2016-05-05 ENCOUNTER — Encounter: Payer: Self-pay | Admitting: Diagnostic Neuroimaging

## 2016-05-05 ENCOUNTER — Telehealth: Payer: Self-pay | Admitting: Diagnostic Neuroimaging
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
## 2016-05-09 ENCOUNTER — Ambulatory Visit (INDEPENDENT_AMBULATORY_CARE_PROVIDER_SITE_OTHER): Payer: BLUE CROSS/BLUE SHIELD | Admitting: Diagnostic Neuroimaging

## 2016-05-09 ENCOUNTER — Encounter: Payer: Self-pay | Admitting: Diagnostic Neuroimaging

## 2016-05-09 VITALS — BP 127/85 | HR 125 | Wt 182.0 lb

## 2016-05-09 DIAGNOSIS — G40909 Epilepsy, unspecified, not intractable, without status epilepticus: Secondary | ICD-10-CM | POA: Diagnosis not present

## 2016-05-09 DIAGNOSIS — F411 Generalized anxiety disorder: Secondary | ICD-10-CM

## 2016-05-09 MED ORDER — LEVETIRACETAM 750 MG PO TABS: 750.0000 mg | ORAL_TABLET | Freq: Two times a day (BID) | ORAL | 12 refills | Status: DC

## 2016-05-09 MED ORDER — LACOSAMIDE 50 MG PO TABS: 50.0000 mg | ORAL_TABLET | Freq: Two times a day (BID) | ORAL | 5 refills | Status: DC

## 2016-05-09 NOTE — Addendum Note (Signed)
Addended byJoycelyn Schmid: PENUMALLI, VIKRAM on: 05/09/2016 04:10 PM   Modules accepted: Orders

## 2016-05-09 NOTE — Patient Instructions (Signed)
-   continue levetiracetam 750mg  twice a day  - add vimpat 50mg  twice a day x 2 weeks, then increase to 100mg  twice a day  - seizure safety and cautions reviewed  - no driving until seizure free x 6 months (last seizure 04/27/16)
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
## 2016-05-10 ENCOUNTER — Telehealth: Payer: Self-pay | Admitting: Diagnostic Neuroimaging

## 2016-05-10 NOTE — Telephone Encounter (Signed)
Spoke with patient who stated she has tried both fax numbers without success. Confirmed she is using correct numbers. She stated she will continue to try, but if unable her husband can bring papers to office today. Re: Vimpat Rx, advised she go to [email or web address]. She stated she did that yesterday, and with manufacturer's coupon discount, it only lowered her cost by $200. She cannot afford $300/month for medication. Advised will discuss with Dr Marjory LiesPenumalli and call her back. She stated she would pick up Keppra and begin taking. She verbalized understanding, appreciation.

## 2016-05-10 NOTE — Telephone Encounter (Signed)
Spoke with patient and advised she go through [employer] website. She stated that is what she did yesterday; she did not try good[facility].  She [facility] and if it is still too costly, she will call back.  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 She stated she will continue to try. She verbalized understanding, appreciation of call.

## 2016-05-10 NOTE — Telephone Encounter (Signed)
Pt called in stating the fax number she was give is giving her a no answer. She also states that medication lacosamide (VIMPAT) 50 MG TABS tablet will cost $400. She can not afford that. I have given her fax # [phone]. Please call and advise

## 2016-05-10 NOTE — Telephone Encounter (Signed)
See if she qualifies for [email or web address] savings and support program through the company. -VRP

## 2016-05-19 ENCOUNTER — Ambulatory Visit: Payer: BLUE CROSS/BLUE SHIELD | Admitting: Diagnostic Neuroimaging

## 2016-05-19 NOTE — Telephone Encounter (Signed)
  Dr Marjory LiesPenumalli- how would you like to proceed? Vimpat would be pretty expensive for patient even with insurance/copay card. Patient is going to contact patient assistance to see if she qualifies.   [facility] They ran patient's insurance through with copay card that I gave them over the phone and verified it would be $394 . He stated cash price would be $556 w/o insurance. I ga

## 2016-05-19 NOTE — Telephone Encounter (Signed)
Called and spoke to patient. She stated she tried dropping off [facility]impat [facility] to [facility] [place] and [facility] [place]. They just ran insurance through with copay card and told her prices. They never filled prescriptions. She is going to call patient assistance to see if she qualifies for this.   Advised I will call pharmacy and see what price they give me as well.  Also advised I spoke to Stanton KidneyDebra in medical records and she is going to send completed paperwork.  I let Stanton KidneyDebra know patient would also like a copy of completed paperwork.

## 2016-05-19 NOTE — Telephone Encounter (Signed)
Called patient back. She stated her work her "harrasing her that her last day of work is tomorrow since they have not received any documentation".   She stated she sent a blank copy and example of what she spoke with Dr Marjory LiesPenumalli about and what is needed on form. She is requesting this be done as soon as possible. She really needed form completed first day she was seen by Dr Marjory LiesPenumalli per patient.   She also stated Vimpat without savings card was 500 dollars and with it was still 300 dollars. She is still currently taking 750mg  2x/day. She did not pick up new rx because of cost. She is wondering what Dr Marjory LiesPenumalli recommends she do. Advised I will speak to Dr Marjory LiesPenumalli and try and call her back before the end of today. She verbalized understanding.

## 2016-05-19 NOTE — Telephone Encounter (Signed)
Dr Marjory LiesPenumalli completed paperwork. Will have Hoy MornDebra S in medical records fax this to appropriate place.

## 2016-05-19 NOTE — Telephone Encounter (Signed)
Patient called regarding work accomodations form, states she faxed 4 pages to our office this morning to [phone]. Patient also advises, she tried savings card for VIMPAT, with savings card, cost is still $300.

## 2016-05-20 MED ORDER — DIVALPROEX SODIUM 250 MG PO DR TAB: 250.0000 mg | DELAYED_RELEASE_TABLET | Freq: Two times a day (BID) | ORAL | 12 refills | Status: DC

## 2016-05-20 NOTE — Addendum Note (Signed)
Addended byJoycelyn Schmid: PENUMALLI, VIKRAM on: 05/20/2016 01:38 PM   Modules accepted: Orders

## 2016-05-20 NOTE — Telephone Encounter (Signed)
Will stop vimpat and start divalproex 250mg  BID instead. 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 -VRP

## 2016-05-23 NOTE — Telephone Encounter (Signed)
Spoke with patient to inform her Dr Marjory LiesPenumalli has discontinued her Vimpat, ordered divalproex. She stated her husband is picking it up today. She then asked if she should taper off Keppra. Advised her per Dr Richrd HumblesPenumalli's plan from her last office visit note, he stated he may reduce Keppra in the future. She then asked if she is to take Keppra and new medication; advised that she is to take both medications. She verbalized understanding, confirmed her fu appointment.

## 2016-05-23 NOTE — Addendum Note (Signed)
Addended by: [place] PinkHESSON, MARY C on: 05/23/2016 10:39 AM   Modules accepted: Orders

## 2016-06-23 ENCOUNTER — Telehealth: Payer: Self-pay | Admitting: Diagnostic Neuroimaging

## 2016-06-23 NOTE — Telephone Encounter (Signed)
Pt called in about adding an antianxiety medication, says when she feels a panic attack is usually when she has a seizure. . Please call [phone]

## 2016-06-23 NOTE — Telephone Encounter (Signed)
Spoke with patient and advised her to call her PCP and request the psychiatry referral be placed before her regular check up. Also advised that Dr Marjory LiesPenumalli  defers her request for anxiety medication to her PCP, that an interim medication prescription could be placed by her PCP until she can see psychiatry. She verbalized understanding, agreement, appreciation.

## 2016-06-23 NOTE — Telephone Encounter (Signed)
Pt should ask PCP for referral to psychiatry. This could be done via phone call or office visit to PCP office. -VRP

## 2016-06-23 NOTE — Telephone Encounter (Signed)
Called patient and asked about her seeing Dr Evelene CroonKaur for her anxiety. She stated she was not seeing Dr Evelene CroonKaur, "was led out of the office". This RN reminded her of Dr Richrd HumblesPenumalli's recommendation when she was seen 05/09/16, that she contact her PCP for psychiatry referral. She stated she had not done that yet, but was going to call him to schedule her regular appointment and would ask fo rreferral.  She stated she is still in training for her job for at least two more weeks. She will not be able to see her PCP until after training is completed. She stated that Dr Marjory LiesPenumalli had discussed with her that she most likely was having anxiety attacks and not seizures, so that was why she was asking about a medication for anxiety.  She verified that she is taking both of her seizures medications as ordered, picked up a refill on Monday.  Informed her this RN would route her request for new medication to Dr Marjory LiesPenumalli and call her back with his reply. She verbalized understanding, appreciation.

## 2016-08-08 ENCOUNTER — Ambulatory Visit: Payer: BLUE CROSS/BLUE SHIELD | Admitting: Diagnostic Neuroimaging

## 2016-08-15 ENCOUNTER — Emergency Department (HOSPITAL_COMMUNITY): Payer: BLUE CROSS/BLUE SHIELD

## 2016-08-15 ENCOUNTER — Encounter (HOSPITAL_COMMUNITY): Payer: Self-pay | Admitting: *Deleted

## 2016-08-15 ENCOUNTER — Emergency Department (HOSPITAL_COMMUNITY)
Admission: EM | Admit: 2016-08-15 | Discharge: 2016-08-15 | Disposition: A | Discharge status: Home / Self Care | Payer: BLUE CROSS/BLUE SHIELD | Attending: Emergency Medicine | Admitting: Emergency Medicine

## 2016-08-15 DIAGNOSIS — S2232XA Fracture of one rib, left side, initial encounter for closed fracture: Secondary | ICD-10-CM | POA: Insufficient documentation

## 2016-08-15 DIAGNOSIS — X58XXXA Exposure to other specified factors, initial encounter: Secondary | ICD-10-CM | POA: Diagnosis not present

## 2016-08-15 DIAGNOSIS — Y929 Unspecified place or not applicable: Secondary | ICD-10-CM | POA: Insufficient documentation

## 2016-08-15 DIAGNOSIS — Y999 Unspecified external cause status: Secondary | ICD-10-CM | POA: Diagnosis not present

## 2016-08-15 DIAGNOSIS — I10 Essential (primary) hypertension: Secondary | ICD-10-CM | POA: Insufficient documentation

## 2016-08-15 DIAGNOSIS — S299XXA Unspecified injury of thorax, initial encounter: Secondary | ICD-10-CM | POA: Diagnosis present

## 2016-08-15 DIAGNOSIS — Y939 Activity, unspecified: Secondary | ICD-10-CM | POA: Insufficient documentation

## 2016-08-15 DIAGNOSIS — R0781 Pleurodynia: Secondary | ICD-10-CM

## 2016-08-15 DIAGNOSIS — Z79899 Other long term (current) drug therapy: Secondary | ICD-10-CM | POA: Diagnosis not present

## 2016-08-15 MED ORDER — OXYCODONE-ACETAMINOPHEN 5-325 MG PO TABS
2.0000 | ORAL_TABLET | Freq: Once | ORAL | Status: AC
  Administered 2016-08-15: 2 via ORAL
  Filled 2016-08-15: qty 2

## 2016-08-15 MED ORDER — OXYCODONE-ACETAMINOPHEN 5-325 MG PO TABS: 1.0000 | ORAL_TABLET | Freq: Four times a day (QID) | ORAL | 0 refills | Status: DC | PRN

## 2016-08-15 MED ORDER — KETOROLAC TROMETHAMINE 60 MG/2ML IM SOLN
60.0000 mg | Freq: Once | INTRAMUSCULAR | Status: AC
  Administered 2016-08-15: 60 mg via INTRAMUSCULAR
  Filled 2016-08-15: qty 2
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
## 2016-08-15 NOTE — Discharge Instructions (Signed)
Use an incentive spirometer at least once per hour while awake to ensure you are taking deep breaths. Take percocet as prescribed for pain control. Follow up with your primary care doctor to ensure resolution of symptoms. You may return for new or concerning symptoms.

## 2016-08-19 ENCOUNTER — Encounter: Payer: Self-pay | Admitting: Diagnostic Neuroimaging

## 2016-08-19 ENCOUNTER — Ambulatory Visit (INDEPENDENT_AMBULATORY_CARE_PROVIDER_SITE_OTHER): Payer: BLUE CROSS/BLUE SHIELD | Admitting: Diagnostic Neuroimaging

## 2016-08-19 VITALS — BP 114/80 | HR 101 | Wt 179.2 lb

## 2016-08-19 DIAGNOSIS — G40909 Epilepsy, unspecified, not intractable, without status epilepticus: Secondary | ICD-10-CM | POA: Diagnosis not present

## 2016-08-19 DIAGNOSIS — F411 Generalized anxiety disorder: Secondary | ICD-10-CM

## 2016-08-19 MED ORDER — LEVETIRACETAM 750 MG PO TABS: 750.0000 mg | ORAL_TABLET | Freq: Two times a day (BID) | ORAL | 12 refills | Status: DC

## 2016-08-19 MED ORDER — DIVALPROEX SODIUM 250 MG PO DR TAB: 250.0000 mg | DELAYED_RELEASE_TABLET | Freq: Two times a day (BID) | ORAL | 12 refills | Status: DC

## 2016-08-19 NOTE — Patient Instructions (Signed)
-   continue levetiracetam 750mg  twice a day; in future may reduce levetiracetam  - continue divalproex 250mg  twice a day  - seizure safety and cautions reviewed  - no driving until seizure free x 6 months (last seizure 08/13/16)
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
## 2016-10-04 ENCOUNTER — Encounter (HOSPITAL_COMMUNITY): Payer: Self-pay

## 2016-12-07 ENCOUNTER — Telehealth: Payer: Self-pay | Admitting: Diagnostic Neuroimaging
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
## 2016-12-07 NOTE — Telephone Encounter (Signed)
Spoke to pt and made appt for tomorrow at 0900 with MM/NP. Relayed again about not driving.  She verbalized understanding.
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
## 2016-12-07 NOTE — Telephone Encounter (Signed)
Ok to see NP. Remind patient she should not be driving herself. -VRP

## 2016-12-08 ENCOUNTER — Ambulatory Visit: Payer: Self-pay | Admitting: Adult Health

## 2016-12-09 ENCOUNTER — Encounter: Payer: Self-pay | Admitting: Adult Health

## 2016-12-13 ENCOUNTER — Telehealth: Payer: Self-pay | Admitting: Diagnostic Neuroimaging

## 2016-12-13 NOTE — Telephone Encounter (Signed)
I spoke to pt.  I relayed that I did speak to her and she did write the appt down.  She forgot.  I told her that could make appt to evaluate, ok to see NP.  Made appt with MM/NP tomorrow at 0830.

## 2016-12-13 NOTE — Telephone Encounter (Signed)
I called mobile, LMVM for pt to return call.

## 2016-12-13 NOTE — Telephone Encounter (Signed)
Appointment Request From: Angelena Form. Josten    With Provider: Joycelyn Schmid, MD [[facility]]    Preferred Date Range: From 12/14/2016 To 12/15/2016    Preferred Times: Any    Reason for visit: Office Visit    Comments:  I called the office on April 11 and received a call back the next day from the nurse. She was supposed to check with the Dr and call me back. I never received the call back and now I have 3 messages. Two stating I came in for an appointment on April 12 at 8:30 and another message says no I came in on April 12 at 9:00. Now I have a message and letter stating I missed an appointment on April 12. I was never came in for an appointment as indicated and never missed an appointment as I was never called back nor was I told I had an appointment scheduled on my behalf.

## 2016-12-14 ENCOUNTER — Encounter: Payer: Self-pay | Admitting: Adult Health

## 2016-12-14 ENCOUNTER — Ambulatory Visit (INDEPENDENT_AMBULATORY_CARE_PROVIDER_SITE_OTHER): Payer: BLUE CROSS/BLUE SHIELD | Admitting: Adult Health

## 2016-12-14 VITALS — BP 109/66 | HR 117 | Wt 163.0 lb

## 2016-12-14 DIAGNOSIS — Z5181 Encounter for therapeutic drug level monitoring: Secondary | ICD-10-CM

## 2016-12-14 DIAGNOSIS — R569 Unspecified convulsions: Secondary | ICD-10-CM

## 2016-12-14 MED ORDER — LEVETIRACETAM 1000 MG PO TABS: 1000.0000 mg | ORAL_TABLET | Freq: Two times a day (BID) | ORAL | 5 refills | Status: DC

## 2016-12-14 NOTE — Patient Instructions (Signed)
Increase Keppra to 1000 mg twice a day Blood work today EEG NO DRIVING If your symptoms worsen or you develop new symptoms please let us know.
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
## 2016-12-15 LAB — COMPREHENSIVE METABOLIC PANEL
ALK PHOS: 71 IU/L (ref 39–117)
ALT: 14 IU/L (ref 0–32)
AST: 14 IU/L (ref 0–40)
Albumin/Globulin Ratio: 1.7 (ref 1.2–2.2)
Albumin: 3.8 g/dL (ref 3.5–5.5)
BUN/Creatinine Ratio: 11 (ref 9–23)
BUN: 9 mg/dL (ref 6–24)
CHLORIDE: 103 mmol/L (ref 96–106)
CO2: 27 mmol/L (ref 18–29)
Calcium: 8.8 mg/dL (ref 8.7–10.2)
Creatinine, Ser: 0.8 mg/dL (ref 0.57–1.00)
GFR calc Af Amer: 104 mL/min/{1.73_m2} (ref >59)
GFR calc non Af Amer: 91 mL/min/{1.73_m2} (ref >59)
Globulin, Total: 2.3 g/dL (ref 1.5–4.5)
Glucose: 87 mg/dL (ref 65–99)
Potassium: 4.3 mmol/L (ref 3.5–5.2)
Sodium: 143 mmol/L (ref 134–144)
Total Protein: 6.1 g/dL (ref 6.0–8.5)

## 2016-12-15 LAB — CBC WITH DIFFERENTIAL/PLATELET
BASOS ABS: 0 10*3/uL (ref 0.0–0.2)
Basos: 0 %
EOS (ABSOLUTE): 0.3 10*3/uL (ref 0.0–0.4)
Eos: 4 %
Hematocrit: 37 % (ref 34.0–46.6)
Hemoglobin: 12.2 g/dL (ref 11.1–15.9)
IMMATURE GRANULOCYTES: 1 %
Immature Grans (Abs): 0 10*3/uL (ref 0.0–0.1)
Lymphocytes Absolute: 1.9 10*3/uL (ref 0.7–3.1)
Lymphs: 30 %
MCH: 30.5 pg (ref 26.6–33.0)
MCHC: 33 g/dL (ref 31.5–35.7)
MCV: 93 fL (ref 79–97)
MONOS ABS: 0.5 10*3/uL (ref 0.1–0.9)
Monocytes: 7 %
Neutrophils Absolute: 3.7 10*3/uL (ref 1.4–7.0)
Neutrophils: 58 %
PLATELETS: 215 10*3/uL (ref 150–379)
RBC: 4 x10E6/uL (ref 3.77–5.28)
RDW: 14.1 % (ref 12.3–15.4)
WBC: 6.4 10*3/uL (ref 3.4–10.8)

## 2016-12-15 LAB — VALPROIC ACID LEVEL: VALPROIC ACID LVL: 68 ug/mL (ref 50–100)

## 2016-12-15 LAB — AMMONIA: AMMONIA: 102 ug/dL — AB (ref 19–87)

## 2016-12-20 ENCOUNTER — Telehealth: Payer: Self-pay

## 2016-12-20 NOTE — Telephone Encounter (Signed)
I spoke to patient and she is aware of results and recommendations.  

## 2016-12-20 NOTE — Telephone Encounter (Signed)
-----   Message from Butch Penny, NP sent at 12/19/2016  1:19 PM EDT ----- Labwork unremarkable. Ammonia slightly elevated. We'll recheck in 1 month.

## 2016-12-26 ENCOUNTER — Ambulatory Visit (INDEPENDENT_AMBULATORY_CARE_PROVIDER_SITE_OTHER): Payer: BLUE CROSS/BLUE SHIELD | Admitting: Diagnostic Neuroimaging

## 2016-12-26 DIAGNOSIS — R569 Unspecified convulsions: Secondary | ICD-10-CM

## 2016-12-26 NOTE — Progress Notes (Signed)
I reviewed note and agree with plan.   Caroljean Monsivais R. Leontina Skidmore, MD  Certified in Neurology, Neurophysiology and Neuroimaging  [facility] [address], [address] [place], [place] [address] [phone]   

## 2017-01-02 NOTE — Procedures (Signed)
   [facility]  EEG (ELECTROENCEPHALOGRAM) REPORT   STUDY DATE: 12/26/16 PATIENT NAME: Elizabeth GrimmerSonia D Hinojos DOB: 1976-07-25 MRN: [id number]  ORDERING CLINICIAN: Butch PennyMegan Millikan, NP / Joycelyn SchmidVikram Penumalli, MD   TECHNOLOGIST: Gearldine ShownLorraine Jones  TECHNIQUE: Electroencephalogram was recorded utilizing standard 10-20 system of lead placement and reformatted into average and bipolar montages.  RECORDING TIME: 42 minutes  ACTIVATION: hyperventilation and photic stimulation  CLINICAL INFORMATION: 41 year old female with seizures.  FINDINGS: Background rhythms of 10-11 hertz and 50-60 microvolts. No focal, lateralizing, epileptiform activity or seizures are seen. Patient recorded in the awake and drowsy state. EKG channel shows regular rhythm of 80-90 beats per minute.  IMPRESSION:  Normal prolonged EEG in the awake and drowsy states.    INTERPRETING PHYSICIAN:  Suanne MarkerVIKRAM R. PENUMALLI, MD Certified in Neurology, Neurophysiology and Neuroimaging  [facility] [address], [address] [place], [place] [address] [phone]

## 2017-01-04 ENCOUNTER — Telehealth: Payer: Self-pay | Admitting: *Deleted

## 2017-01-04 NOTE — Telephone Encounter (Signed)
LMVM for pt on her mobile (ok per DPR) that her EEG was normal.   She is to call back if questions/concerns.

## 2017-01-04 NOTE — Telephone Encounter (Signed)
-----   Message from Butch PennyMegan Millikan, NP sent at 01/03/2017  3:20 PM EDT ----- EEG normal. Please call patient.

## 2017-02-09 ENCOUNTER — Telehealth: Payer: Self-pay | Admitting: Adult Health

## 2017-02-09 DIAGNOSIS — Z5181 Encounter for therapeutic drug level monitoring: Secondary | ICD-10-CM

## 2017-02-09 NOTE — Telephone Encounter (Signed)
-----   Message from Butch PennyMegan Bettyjane Shenoy, NP sent at 12/19/2016  1:19 PM EDT ----- ammonia

## 2017-02-09 NOTE — Telephone Encounter (Signed)
Rn call patient about lab work repeat. Rn stated per Aundra MilletMegan NP she wants to repeat ammonia level,and valproic acid level. PT stated she will come tomorrow before the office close at 1200pm.

## 2017-02-09 NOTE — Telephone Encounter (Signed)
Ammonia level was elevated at the last visit. Please call the patient to come in for repeat blood work.

## 2017-02-13 ENCOUNTER — Other Ambulatory Visit (INDEPENDENT_AMBULATORY_CARE_PROVIDER_SITE_OTHER): Payer: Self-pay

## 2017-02-13 DIAGNOSIS — Z5181 Encounter for therapeutic drug level monitoring: Secondary | ICD-10-CM

## 2017-02-13 DIAGNOSIS — Z0289 Encounter for other administrative examinations: Secondary | ICD-10-CM

## 2017-02-14 ENCOUNTER — Telehealth: Payer: Self-pay

## 2017-02-14 LAB — AMMONIA: Ammonia: 84 ug/dL (ref 19–87)

## 2017-02-14 LAB — VALPROIC ACID LEVEL: VALPROIC ACID LVL: 33 ug/mL — AB (ref 50–100)

## 2017-02-14 NOTE — Telephone Encounter (Signed)
-----   Message from Butch PennyMegan Millikan, NP sent at 02/14/2017  3:49 PM EDT ----- Lab work unremarkable. Please call patient.

## 2017-02-14 NOTE — Telephone Encounter (Signed)
Left vm on patients confidentially phone that lab work was unremarkable both levels of valporic acid and ammonia have decrease. RN left vm to call office if she had any questions.

## 2017-02-20 ENCOUNTER — Ambulatory Visit: Payer: BLUE CROSS/BLUE SHIELD | Admitting: Diagnostic Neuroimaging

## 2017-03-21 ENCOUNTER — Telehealth: Payer: Self-pay | Admitting: Adult Health

## 2017-03-21 NOTE — Telephone Encounter (Signed)
Pt called to schedule an appt, [facility] which she did. I have left a VM for her to call back to schedule an appt.  FYI

## 2017-04-07 ENCOUNTER — Other Ambulatory Visit: Payer: Self-pay | Admitting: Obstetrics and Gynecology

## 2017-04-07 DIAGNOSIS — N63 Unspecified lump in unspecified breast: Secondary | ICD-10-CM

## 2017-04-17 ENCOUNTER — Telehealth: Payer: Self-pay | Admitting: Diagnostic Neuroimaging

## 2017-04-17 ENCOUNTER — Ambulatory Visit
Admission: RE | Admit: 2017-04-17 | Discharge: 2017-04-17 | Disposition: A | Discharge status: Home / Self Care | Payer: BLUE CROSS/BLUE SHIELD | Source: Ambulatory Visit | Attending: Obstetrics and Gynecology | Admitting: Obstetrics and Gynecology

## 2017-04-17 DIAGNOSIS — N63 Unspecified lump in unspecified breast: Secondary | ICD-10-CM

## 2017-04-17 NOTE — Telephone Encounter (Signed)
LVM requesting call back to discuss.

## 2017-04-17 NOTE — Telephone Encounter (Signed)
Pt is asking for a call back to discuss her seziure medications levETIRAcetam (KEPPRA) 1000 MG tablet & divalproex (DEPAKOTE) 250 MG DR tablet affecting her zolpidem (AMBIEN) 10 MG tablet, she is asking for a call back

## 2017-04-18 NOTE — Telephone Encounter (Signed)
LVM informing the patient that Dr Marjory Lies stated there is no contraindication to taking all three medications together. Advised they can cause increased CNS side effects such as sleepiness, fogginess, dizziness, memory issues. Advised she call back tomorrow if her questions were not answered.

## 2017-06-04 ENCOUNTER — Other Ambulatory Visit: Payer: Self-pay | Admitting: Diagnostic Neuroimaging

## 2017-06-04 ENCOUNTER — Other Ambulatory Visit: Payer: Self-pay | Admitting: Adult Health

## 2017-06-13 ENCOUNTER — Telehealth (HOSPITAL_COMMUNITY): Payer: Self-pay

## 2017-06-13 NOTE — Telephone Encounter (Signed)
Declined appt This patient is overdue for recommended follow-up with a bariatric surgeon at [facility]. A letter was mailed to the address on file 10/04/16 from both [facility] & [facility] in attempt to reestablish post-op care. The letter included a patient survey which was returned to the [facility] Bariatric Dept today via mail.  Patient declined an appointment advising current insurance does not cover bariatric follow-up with added comments that she is unemployed at this time and currently has high deductible/high OOP insurance plan.  Copy of the survey was shared with both program [employer] and Dario Guardian at [facility] so she may file in patients office chart.

## 2017-06-28 ENCOUNTER — Ambulatory Visit: Payer: BLUE CROSS/BLUE SHIELD | Admitting: Diagnostic Neuroimaging

## 2017-07-10 ENCOUNTER — Telehealth: Payer: Self-pay | Admitting: *Deleted

## 2017-07-10 NOTE — Telephone Encounter (Signed)
Pt DMV form on Sandy Young desk. 

## 2017-07-11 DIAGNOSIS — Z0289 Encounter for other administrative examinations: Secondary | ICD-10-CM

## 2017-07-12 NOTE — Telephone Encounter (Signed)
LMVM for pt to return call about DMV form.

## 2017-07-13 NOTE — Telephone Encounter (Signed)
DMV form reviewed and signed by MM/NP.  To MR.

## 2017-07-14 NOTE — Telephone Encounter (Signed)
I called pt, LVM pt form is ready to p/u.

## 2017-07-24 NOTE — Telephone Encounter (Signed)
Pt has called and is asking that RN Andrey CampanileSandy calls her back re: the DMV forms that Dr Marjory LiesPenumalli filled out she states it was not fully done.  Pt is refaxing it back in.  Please call

## 2017-07-24 NOTE — Telephone Encounter (Signed)
DMV addended.  MM/NP signed completed copy then to MR.

## 2017-07-24 NOTE — Telephone Encounter (Signed)
I spoke to pt.  2 parts on DMV form need to be addressed. Placed in MM/NP for review/completion and signature.

## 2017-07-25 NOTE — Telephone Encounter (Signed)
Re faxed DMV form on 07/25/17.

## 2017-07-31 ENCOUNTER — Ambulatory Visit (INDEPENDENT_AMBULATORY_CARE_PROVIDER_SITE_OTHER): Payer: BLUE CROSS/BLUE SHIELD | Admitting: Diagnostic Neuroimaging

## 2017-07-31 ENCOUNTER — Encounter: Payer: Self-pay | Admitting: Diagnostic Neuroimaging

## 2017-07-31 VITALS — BP 126/73 | HR 101 | Wt 164.2 lb

## 2017-07-31 DIAGNOSIS — G40909 Epilepsy, unspecified, not intractable, without status epilepticus: Secondary | ICD-10-CM | POA: Diagnosis not present

## 2017-07-31 MED ORDER — DIVALPROEX SODIUM 250 MG PO DR TAB: 250.0000 mg | DELAYED_RELEASE_TABLET | Freq: Two times a day (BID) | ORAL | 4 refills | Status: DC

## 2017-07-31 MED ORDER — LEVETIRACETAM 1000 MG PO TABS: 1000.0000 mg | ORAL_TABLET | Freq: Two times a day (BID) | ORAL | 4 refills | Status: DC
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
## 2017-09-20 ENCOUNTER — Telehealth: Payer: Self-pay | Admitting: Diagnostic Neuroimaging
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
## 2017-09-21 NOTE — Telephone Encounter (Signed)
[facility].

## 2017-09-21 NOTE — Telephone Encounter (Signed)
:  LMVM for pt on her mobile, message from Dr. Marjory LiesPenumalli, relating to letter she requested.  She is to call back if questions.

## 2017-09-21 NOTE — Telephone Encounter (Signed)
Pt returned RN's call. She said she just [facility] the [facility] and they said Dr Demetrius CharityP would have to comply with their request. She said if he did not she would have to hire an attorney to get him to comply. Please call

## 2017-09-22 ENCOUNTER — Encounter: Payer: Self-pay | Admitting: *Deleted

## 2017-09-22 NOTE — Telephone Encounter (Addendum)
Letter signed.  Called pt (read her letter).  I faxed with confirmation received. [phone].

## 2017-10-05 ENCOUNTER — Encounter (HOSPITAL_COMMUNITY): Payer: Self-pay

## 2018-01-26 ENCOUNTER — Encounter: Payer: Self-pay | Admitting: Adult Health

## 2018-01-29 ENCOUNTER — Telehealth: Payer: Self-pay | Admitting: Adult Health

## 2018-01-29 NOTE — Telephone Encounter (Signed)
-----   Message from Mychart, Generic sent at 01/26/2018 3:39 PM EDT -----    Appointment Request From: Althea GrimmerSonia D Hine    With Provider: Butch PennyMegan Millikan, NP [[facility]]    Preferred Date Range: 03/27/2018 - 03/28/2018    Preferred Times: Any time    Reason for visit: Request an Appointment    Comments:  I nees to reschedule appointment on June 4th. I have class for the summer and semester ends July 25

## 2018-01-30 ENCOUNTER — Ambulatory Visit: Payer: BLUE CROSS/BLUE SHIELD | Admitting: Adult Health
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
## 2018-05-23 ENCOUNTER — Encounter: Payer: Self-pay | Admitting: *Deleted

## 2018-05-23 ENCOUNTER — Telehealth: Payer: Self-pay | Admitting: Diagnostic Neuroimaging

## 2018-05-23 MED ORDER — DIVALPROEX SODIUM 250 MG PO DR TAB: 250.0000 mg | DELAYED_RELEASE_TABLET | Freq: Two times a day (BID) | ORAL | 1 refills | Status: DC

## 2018-05-23 NOTE — Addendum Note (Signed)
Addended by: Guy BeginYOUNG, Asheley Hellberg S on: 05/23/2018 04:29 PM   Modules accepted: Orders

## 2018-05-23 NOTE — Telephone Encounter (Signed)
Patient requesting refill of divalproex (DEPAKOTE) 250 MG DR tablet sent to [facility]. In [place]. She is completely out of medication. She says will call and make appointment after speaking to billing.

## 2018-06-01 ENCOUNTER — Ambulatory Visit: Payer: BLUE CROSS/BLUE SHIELD | Admitting: Adult Health

## 2018-06-01 ENCOUNTER — Encounter

## 2018-07-05 ENCOUNTER — Ambulatory Visit (INDEPENDENT_AMBULATORY_CARE_PROVIDER_SITE_OTHER): Payer: BLUE CROSS/BLUE SHIELD | Admitting: Adult Health

## 2018-07-05 ENCOUNTER — Encounter: Payer: Self-pay | Admitting: Adult Health

## 2018-07-05 VITALS — BP 111/69 | HR 96 | Ht 64.0 in | Wt 153.8 lb

## 2018-07-05 DIAGNOSIS — G40909 Epilepsy, unspecified, not intractable, without status epilepticus: Secondary | ICD-10-CM | POA: Diagnosis not present

## 2018-07-05 DIAGNOSIS — Z5181 Encounter for therapeutic drug level monitoring: Secondary | ICD-10-CM

## 2018-07-05 DIAGNOSIS — F419 Anxiety disorder, unspecified: Secondary | ICD-10-CM | POA: Diagnosis not present
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
## 2018-07-05 NOTE — Patient Instructions (Signed)
Your Plan:  Continue Depakote and Keppra Blood work today  If your symptoms worsen or you develop new symptoms please let us know.   Thank you for coming to see us at [facility]. I hope we have been able to provide you high quality care today.  You may receive a patient satisfaction survey over the next few weeks. We would appreciate your feedback and comments so that we may continue to improve ourselves and the health of our patients.  

## 2018-07-08 LAB — COMPREHENSIVE METABOLIC PANEL
A/G RATIO: 1.6 (ref 1.2–2.2)
ALK PHOS: 68 IU/L (ref 39–117)
ALT: 6 IU/L (ref 0–32)
AST: 9 IU/L (ref 0–40)
Albumin: 4.1 g/dL (ref 3.5–5.5)
BUN/Creatinine Ratio: 16 (ref 9–23)
BUN: 14 mg/dL (ref 6–24)
CHLORIDE: 102 mmol/L (ref 96–106)
CO2: 23 mmol/L (ref 20–29)
Calcium: 9.3 mg/dL (ref 8.7–10.2)
Creatinine, Ser: 0.9 mg/dL (ref 0.57–1.00)
GFR calc Af Amer: 90 mL/min/{1.73_m2} (ref >59)
GFR calc non Af Amer: 78 mL/min/{1.73_m2} (ref >59)
GLUCOSE: 83 mg/dL (ref 65–99)
Globulin, Total: 2.6 g/dL (ref 1.5–4.5)
POTASSIUM: 4.6 mmol/L (ref 3.5–5.2)
Sodium: 138 mmol/L (ref 134–144)
Total Protein: 6.7 g/dL (ref 6.0–8.5)

## 2018-07-08 LAB — CBC WITH DIFFERENTIAL/PLATELET
BASOS: 1 %
Basophils Absolute: 0 10*3/uL (ref 0.0–0.2)
EOS (ABSOLUTE): 0.1 10*3/uL (ref 0.0–0.4)
Eos: 2 %
Hematocrit: 33.9 % — ABNORMAL LOW (ref 34.0–46.6)
Hemoglobin: 10.8 g/dL — ABNORMAL LOW (ref 11.1–15.9)
Immature Grans (Abs): 0 10*3/uL (ref 0.0–0.1)
Immature Granulocytes: 1 %
LYMPHS ABS: 2.3 10*3/uL (ref 0.7–3.1)
Lymphs: 32 %
MCH: 28.1 pg (ref 26.6–33.0)
MCHC: 31.9 g/dL (ref 31.5–35.7)
MCV: 88 fL (ref 79–97)
MONOS ABS: 0.7 10*3/uL (ref 0.1–0.9)
Monocytes: 9 %
Neutrophils Absolute: 4.2 10*3/uL (ref 1.4–7.0)
Neutrophils: 55 %
PLATELETS: 227 10*3/uL (ref 150–450)
RBC: 3.85 x10E6/uL (ref 3.77–5.28)
RDW: 14.2 % (ref 12.3–15.4)
WBC: 7.4 10*3/uL (ref 3.4–10.8)

## 2018-07-08 LAB — LEVETIRACETAM LEVEL: Levetiracetam Lvl: 7.4 ug/mL — ABNORMAL LOW (ref 10.0–40.0)

## 2018-07-08 LAB — VALPROIC ACID LEVEL: Valproic Acid Lvl: 20 ug/mL — ABNORMAL LOW (ref 50–100)

## 2018-07-10 ENCOUNTER — Telehealth: Payer: Self-pay | Admitting: *Deleted

## 2018-07-10 NOTE — Telephone Encounter (Signed)
LVM informing the patient that her labs showed her Depakote and Keppra level was low. Advised her to take the medication 12 hours apart approximately and not to skip any medication. Advised her if she has any seizure events she should let us know.  Left number for any questions.

## 2018-07-11 ENCOUNTER — Ambulatory Visit (HOSPITAL_COMMUNITY)
Admission: EM | Admit: 2018-07-11 | Discharge: 2018-07-11 | Disposition: A | Discharge status: Home / Self Care | Payer: BLUE CROSS/BLUE SHIELD | Attending: Family Medicine | Admitting: Family Medicine

## 2018-07-11 ENCOUNTER — Encounter (HOSPITAL_COMMUNITY): Payer: Self-pay | Admitting: Emergency Medicine

## 2018-07-11 DIAGNOSIS — S161XXA Strain of muscle, fascia and tendon at neck level, initial encounter: Secondary | ICD-10-CM | POA: Diagnosis not present

## 2018-07-11 MED ORDER — CYCLOBENZAPRINE HCL 10 MG PO TABS: ORAL_TABLET | ORAL | 0 refills | Status: DC
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
## 2018-07-11 NOTE — Discharge Instructions (Signed)
Be aware, muscle medications may cause drowsiness. Please do not drive, operate heavy machinery or make important decisions while on this medication, it can cloud your judgement.
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
## 2018-07-17 NOTE — Progress Notes (Signed)
I reviewed note and agree with plan.   VIKRAM R. PENUMALLI, MD  Certified in Neurology, Neurophysiology and Neuroimaging  [facility] [address], [address] [place], [place] [address] [phone]   

## 2018-08-17 ENCOUNTER — Other Ambulatory Visit: Payer: Self-pay | Admitting: Obstetrics and Gynecology

## 2018-08-17 DIAGNOSIS — Z803 Family history of malignant neoplasm of breast: Secondary | ICD-10-CM

## 2018-08-23 ENCOUNTER — Ambulatory Visit
Admission: RE | Admit: 2018-08-23 | Discharge: 2018-08-23 | Disposition: A | Discharge status: Home / Self Care | Payer: BLUE CROSS/BLUE SHIELD | Source: Ambulatory Visit | Attending: Obstetrics and Gynecology | Admitting: Obstetrics and Gynecology

## 2018-08-23 DIAGNOSIS — Z803 Family history of malignant neoplasm of breast: Secondary | ICD-10-CM

## 2018-08-23 MED ORDER — GADOBUTROL 1 MMOL/ML IV SOLN
7.0000 mL | Freq: Once | INTRAVENOUS | Status: AC | PRN
  Administered 2018-08-23: 7 mL via INTRAVENOUS

## 2018-08-26 ENCOUNTER — Other Ambulatory Visit: Payer: Self-pay | Admitting: Adult Health

## 2018-08-30 ENCOUNTER — Ambulatory Visit: Payer: BLUE CROSS/BLUE SHIELD | Admitting: Adult Health

## 2018-10-25 ENCOUNTER — Other Ambulatory Visit: Payer: Self-pay | Admitting: Diagnostic Neuroimaging

## 2019-01-28 ENCOUNTER — Telehealth: Payer: Self-pay

## 2019-01-28 NOTE — Telephone Encounter (Signed)
Spoke with the patient and they have given verbal consent to file their insurance and to do a doxy.me visit. E-mail, mobile number and carrier have been confirmed and sent.  E-mail: [email or web address] Text: [phone] Development worker, international aid)

## 2019-02-04 ENCOUNTER — Other Ambulatory Visit: Payer: Self-pay | Admitting: Adult Health

## 2019-02-04 ENCOUNTER — Ambulatory Visit: Payer: BC Managed Care – PPO | Admitting: Family Medicine

## 2019-02-04 ENCOUNTER — Ambulatory Visit: Payer: BLUE CROSS/BLUE SHIELD | Admitting: Adult Health

## 2019-02-04 ENCOUNTER — Other Ambulatory Visit: Payer: Self-pay

## 2019-02-04 ENCOUNTER — Telehealth: Payer: Self-pay | Admitting: *Deleted

## 2019-02-04 MED ORDER — LEVETIRACETAM 500 MG PO TABS
1000.00 | ORAL_TABLET | ORAL | Status: DC
Start: 2019-02-03 — End: 2019-02-04

## 2019-02-04 MED ORDER — DESLORATADINE-PSEUDOEPHED ER 2.5-120 MG PO TB12
250.00 | ORAL_TABLET | ORAL | Status: DC
Start: 2019-02-03 — End: 2019-02-04

## 2019-02-04 MED ORDER — MEDI-TUSSIN DM DOUBLE STRENGTH 30-200 MG/5ML PO LIQD
1000.00 | ORAL | Status: DC
Start: ? — End: 2019-02-04

## 2019-02-04 MED ORDER — BARO-CAT PO
ORAL | Status: DC
Start: ? — End: 2019-02-04

## 2019-02-07 NOTE — Telephone Encounter (Signed)
error 

## 2019-02-11 ENCOUNTER — Ambulatory Visit (INDEPENDENT_AMBULATORY_CARE_PROVIDER_SITE_OTHER): Payer: BC Managed Care – PPO | Admitting: Family Medicine

## 2019-02-11 ENCOUNTER — Other Ambulatory Visit: Payer: Self-pay

## 2019-02-11 ENCOUNTER — Encounter: Payer: Self-pay | Admitting: Family Medicine

## 2019-02-11 DIAGNOSIS — G40909 Epilepsy, unspecified, not intractable, without status epilepticus: Secondary | ICD-10-CM | POA: Diagnosis not present

## 2019-02-11 DIAGNOSIS — G43709 Chronic migraine without aura, not intractable, without status migrainosus: Secondary | ICD-10-CM

## 2019-02-11 MED ORDER — AMITRIPTYLINE HCL 25 MG PO TABS: 25.0000 mg | ORAL_TABLET | Freq: Every day | ORAL | 3 refills | Status: DC
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
## 2019-02-19 ENCOUNTER — Telehealth: Payer: Self-pay

## 2019-02-19 NOTE — Telephone Encounter (Signed)
Unable to get in contact with the patient to schedule her lab appt. I left a voicemail asking her to return my call. Office number was provided. Please schedule patient a lab appt when the patient calls back.

## 2019-02-25 NOTE — Progress Notes (Signed)
I reviewed note and agree with plan.   VIKRAM R. PENUMALLI, MD 02/25/2019, 3:58 PM Certified in Neurology, Neurophysiology and Neuroimaging  [facility] [address], [address] [place], [place] [address] [phone]  

## 2019-03-13 NOTE — Telephone Encounter (Signed)
Pt returned call and would like orders sent over to [facility] in [place] on [address]

## 2019-03-14 NOTE — Telephone Encounter (Signed)
Spoke to pt and she will have labs done at her work, Monday 0700 03-18-19. Fax confirmation received.  [phone],  [facility].  [phone].

## 2019-03-14 NOTE — Telephone Encounter (Signed)
Pt called in and stated she needs orders faxed over today she will not be avail to go to [facility] any other time, she states this was suppose to be handled at her last appt , pt is requesting a call back. She states she hasnt requested any refills so she doesn't understand where the refill request is coming from.  CB# [phone]

## 2019-03-14 NOTE — Telephone Encounter (Signed)
LMVM for pt that did receive message.  Need trough level. Will fax on Monday once signed by AL/NP.  If pt has questions to call me back.

## 2019-03-20 ENCOUNTER — Encounter: Payer: Self-pay | Admitting: Family Medicine

## 2019-03-22 ENCOUNTER — Encounter: Payer: Self-pay | Admitting: Family Medicine

## 2019-04-22 ENCOUNTER — Other Ambulatory Visit: Payer: Self-pay | Admitting: Diagnostic Neuroimaging

## 2019-04-22 ENCOUNTER — Other Ambulatory Visit: Payer: Self-pay | Admitting: Adult Health

## 2019-07-16 ENCOUNTER — Encounter: Payer: Self-pay | Admitting: *Deleted

## 2019-07-16 ENCOUNTER — Other Ambulatory Visit: Payer: Self-pay | Admitting: Family Medicine

## 2019-07-16 MED ORDER — DIVALPROEX SODIUM 250 MG PO DR TAB: 250.0000 mg | DELAYED_RELEASE_TABLET | Freq: Two times a day (BID) | ORAL | 0 refills | Status: DC

## 2019-07-16 MED ORDER — LEVETIRACETAM 1000 MG PO TABS: 1000.0000 mg | ORAL_TABLET | Freq: Two times a day (BID) | ORAL | 0 refills | Status: DC

## 2019-07-16 NOTE — Telephone Encounter (Addendum)
Refilled depakote, levetiracetam and sent her my chart advising her of refills. Requested she call and schedule one year follow up.

## 2019-07-16 NOTE — Addendum Note (Signed)
Addended by: Minna Antis on: 07/16/2019 09:48 AM   Modules accepted: Orders

## 2019-07-16 NOTE — Telephone Encounter (Signed)
Pt has called for a refill on her  divalproex (DEPAKOTE) 250 MG DR tablet levETIRAcetam (KEPPRA) 1000 MG tablet  [id number]

## 2019-09-26 ENCOUNTER — Encounter: Payer: Self-pay | Admitting: Family Medicine

## 2019-10-03 ENCOUNTER — Telehealth: Payer: Self-pay | Admitting: Family Medicine

## 2019-10-03 MED ORDER — AMITRIPTYLINE HCL 25 MG PO TABS: 50.0000 mg | ORAL_TABLET | Freq: Every day | ORAL | 3 refills | Status: DC

## 2019-10-03 NOTE — Telephone Encounter (Signed)
She may increase to 50mg  at bedtime. I do not like to increase much more than this. We will need to see her back if this dose isn't helping.

## 2019-10-03 NOTE — Telephone Encounter (Signed)
Pt has called to report that she needs an increase in her amitriptyline (ELAVIL) 25 MG tablet pt still using  [facility] [phone]

## 2019-10-03 NOTE — Addendum Note (Signed)
Addended byNicholas Lose, Ilijah Doucet L on: 10/03/2019 12:21 PM   Modules accepted: Orders

## 2019-10-04 ENCOUNTER — Other Ambulatory Visit: Payer: Self-pay | Admitting: Family Medicine

## 2019-10-04 MED ORDER — DIVALPROEX SODIUM 250 MG PO DR TAB: 250.0000 mg | DELAYED_RELEASE_TABLET | Freq: Two times a day (BID) | ORAL | 0 refills | Status: DC

## 2019-10-04 MED ORDER — LEVETIRACETAM 1000 MG PO TABS: 1000.0000 mg | ORAL_TABLET | Freq: Two times a day (BID) | ORAL | 0 refills | Status: DC

## 2020-01-04 ENCOUNTER — Other Ambulatory Visit: Payer: Self-pay | Admitting: Family Medicine

## 2020-01-06 MED ORDER — LEVETIRACETAM 1000 MG PO TABS: 1000.0000 mg | ORAL_TABLET | Freq: Two times a day (BID) | ORAL | 0 refills | Status: DC

## 2020-01-06 MED ORDER — DIVALPROEX SODIUM 250 MG PO DR TAB: 250.0000 mg | DELAYED_RELEASE_TABLET | Freq: Two times a day (BID) | ORAL | 0 refills | Status: DC

## 2020-02-18 ENCOUNTER — Ambulatory Visit: Payer: BC Managed Care – PPO | Admitting: Family Medicine

## 2020-02-20 ENCOUNTER — Encounter: Payer: Self-pay | Admitting: Family Medicine

## 2020-02-20 ENCOUNTER — Ambulatory Visit: Payer: Self-pay | Admitting: Family Medicine

## 2020-03-03 ENCOUNTER — Other Ambulatory Visit: Payer: Self-pay | Admitting: Obstetrics and Gynecology

## 2020-03-03 DIAGNOSIS — R928 Other abnormal and inconclusive findings on diagnostic imaging of breast: Secondary | ICD-10-CM

## 2020-03-09 ENCOUNTER — Emergency Department (HOSPITAL_COMMUNITY): Payer: BC Managed Care – PPO

## 2020-03-09 ENCOUNTER — Encounter (HOSPITAL_COMMUNITY): Payer: Self-pay

## 2020-03-09 ENCOUNTER — Other Ambulatory Visit: Payer: Self-pay

## 2020-03-09 ENCOUNTER — Emergency Department (HOSPITAL_COMMUNITY)
Admission: EM | Admit: 2020-03-09 | Discharge: 2020-03-09 | Disposition: A | Discharge status: Home / Self Care | Payer: BC Managed Care – PPO | Attending: Emergency Medicine | Admitting: Emergency Medicine

## 2020-03-09 DIAGNOSIS — R1030 Lower abdominal pain, unspecified: Secondary | ICD-10-CM | POA: Diagnosis not present

## 2020-03-09 DIAGNOSIS — R10814 Left lower quadrant abdominal tenderness: Secondary | ICD-10-CM | POA: Diagnosis not present

## 2020-03-09 DIAGNOSIS — I10 Essential (primary) hypertension: Secondary | ICD-10-CM | POA: Diagnosis not present

## 2020-03-09 DIAGNOSIS — K921 Melena: Secondary | ICD-10-CM

## 2020-03-09 LAB — COMPREHENSIVE METABOLIC PANEL
ALT: 11 U/L (ref 0–44)
AST: 15 U/L (ref 15–41)
Albumin: 3.8 g/dL (ref 3.5–5.0)
Alkaline Phosphatase: 58 U/L (ref 38–126)
Anion gap: 8 (ref 5–15)
BUN: 13 mg/dL (ref 6–20)
CO2: 27 mmol/L (ref 22–32)
Calcium: 8.4 mg/dL — ABNORMAL LOW (ref 8.9–10.3)
Chloride: 103 mmol/L (ref 98–111)
Creatinine, Ser: 0.85 mg/dL (ref 0.44–1.00)
GFR calc Af Amer: >60 mL/min (ref >60)
GFR calc non Af Amer: >60 mL/min (ref >60)
Glucose, Bld: 87 mg/dL (ref 70–99)
Potassium: 3.9 mmol/L (ref 3.5–5.1)
Sodium: 138 mmol/L (ref 135–145)
Total Bilirubin: 0.4 mg/dL (ref 0.3–1.2)
Total Protein: 6.2 g/dL — ABNORMAL LOW (ref 6.5–8.1)

## 2020-03-09 LAB — I-STAT BETA HCG BLOOD, ED (MC, WL, AP ONLY): I-stat hCG, quantitative: <5 m[IU]/mL (ref <5)

## 2020-03-09 LAB — CBC
HCT: 31.8 % — ABNORMAL LOW (ref 36.0–46.0)
Hemoglobin: 9.9 g/dL — ABNORMAL LOW (ref 12.0–15.0)
MCH: 27.7 pg (ref 26.0–34.0)
MCHC: 31.1 g/dL (ref 30.0–36.0)
MCV: 89.1 fL (ref 80.0–100.0)
Platelets: 192 10*3/uL (ref 150–400)
RBC: 3.57 MIL/uL — ABNORMAL LOW (ref 3.87–5.11)
RDW: 16.6 % — ABNORMAL HIGH (ref 11.5–15.5)
WBC: 6 10*3/uL (ref 4.0–10.5)
nRBC: 0 % (ref 0.0–0.2)

## 2020-03-09 LAB — TYPE AND SCREEN
ABO/RH(D): A NEG
Antibody Screen: NEGATIVE

## 2020-03-09 LAB — PROTIME-INR
INR: 1.1 (ref 0.8–1.2)
Prothrombin Time: 13.5 seconds (ref 11.4–15.2)

## 2020-03-09 LAB — POC OCCULT BLOOD, ED: Fecal Occult Bld: POSITIVE — AB

## 2020-03-09 MED ORDER — IOHEXOL 300 MG/ML  SOLN
100.0000 mL | Freq: Once | INTRAMUSCULAR | Status: AC | PRN
  Administered 2020-03-09: 100 mL via INTRAVENOUS

## 2020-03-09 MED ORDER — SODIUM CHLORIDE (PF) 0.9 % IJ SOLN
INTRAMUSCULAR | Status: AC
  Filled 2020-03-09: qty 50

## 2020-03-09 MED ORDER — ACETAMINOPHEN 500 MG PO TABS
1000.0000 mg | ORAL_TABLET | Freq: Once | ORAL | Status: AC
  Administered 2020-03-09: 1000 mg via ORAL
  Filled 2020-03-09: qty 2

## 2020-03-09 NOTE — Discharge Instructions (Addendum)
Please call [facility] gastroenterology to set up a follow-up appointment.  If you develop any new or worsening symptoms you need to return to the emergency department for immediate reevaluation.  I would recommend that you start taking Tylenol for management of your pain and stop taking ibuprofen.   It was a pleasure to meet you.

## 2020-03-09 NOTE — ED Provider Notes (Signed)
[facility]-EMERGENCY DEPT Provider Note   CSN: [id number] Arrival date & time: 03/09/20  1059     History Chief Complaint  Patient presents with  . Abdominal Pain  . Migraine  . Blood In Stools    [name] is a 44 y.o. female.  HPI Patient is a 44 year old female with a medical history as noted below who presents to the emergency department with multiple complaints.  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  The last 4 months she has been experiencing intermittent constipation and diarrhea.  She additionally notes intermittent nausea with very periodic vomiting.  She does note that she vomited yesterday.  Nonbilious nonbloody.  No nausea currently. She states that she has noticed increased mucus in her stools over the last 4 months and yesterday had an episode of bright red blood in her stool.  She has some mild lower abdominal cramping.  No fevers, chills, chest pain, shortness of breath, leg swelling, syncope. She notes a history of migraine headaches but denies any current HA. She is taking 400 mg of ibuprofen daily due to a recent fracture in the RLE but denies any ETOH use.      Past Medical History:  Diagnosis Date  . Arthritis    foot and knee  . Bruises easily   . GERD (gastroesophageal reflux disease)    resolved 5 yrs ago  . Headache, migraine   . Headaches, cluster   . 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 Hyperlipidemia   . Hypertension    resolved 5 yrs ago  . Incontinence   . Lumbago   . Plantar fasciitis   . Seizures (HCC)    seizure 11/23/15 and mild seizure 02/08/16  . Sinus problem   . Sore throat   . Wears glasses   . Wears glasses    reading glasses only    Patient Active Problem List   Diagnosis Date Noted  . Seizure disorder (HCC) 02/11/2019  . Hypertension 07/01/2011  . Hyperlipidemia 07/01/2011  . 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 CARPAL TUNNEL RELEASE Right 02/02/2011  . CARPAL TUNNEL RELEASE Left 01/22/2014   Procedure: LEFT CARPAL TUNNEL RELEASE;  Surgeon: Nicki ReaperGary R Kuzma, MD;  Location: [facility];  Service: Orthopedics;  Laterality: Left;  . CHOLECYSTECTOMY  08/06/2004  . ESOPHAGOGASTRODUODENOSCOPY  03/14/2011  . GASTRIC BYPASS  03/14/11   lap. Roux-en-Y  . HYSTEROSCOPY WITH NOVASURE  10/15/2004  . PLANTAR FASCIA RELEASE Left 08/25/2009  . PLANTAR FASCIA RELEASE Right 01/02/2004  . TRIGGER FINGER RELEASE Right 06/24/2014   Procedure: RELEASE A-1 PULLEY RIGHT RING FINGER;  Surgeon: Cindee SaltGary Kuzma, MD;  Location: [facility];  Service: Orthopedics;  Laterality: Right;  ANESTHESIA: IV REGIONAL FAB  . TUBAL LIGATION  1999     OB History   No obstetric history on file.     Family History  Problem Relation Age of Onset  . Gout Father   . Cancer Maternal Grandmother        breast  . Breast cancer Maternal Grandmother   . Breast cancer Mother   . Stroke Paternal Grandmother   . Dementia Paternal Grandmother     Social History   Tobacco Use  . Smoking status: Never Smoker  . Smokeless tobacco: Never Used  Vaping Use  . Vaping Use: Never used  Substance Use Topics  . Alcohol use: No  . Drug use: No    Home Medications Prior to Admission medications   Medication Sig Start Date End Date Taking? Authorizing Provider  ALPRAZolam Prudy Feeler) 0.25 MG tablet Take 0.25 mg by mouth daily as needed for anxiety. 02/26/20   [provider]  amitriptyline (ELAVIL) 25 MG tablet Take 2 tablets (50 mg total) by mouth at bedtime. 10/03/19   Lomax, Amy, NP  cyclobenzaprine (FLEXERIL) 10 MG tablet Take 1 tablet by mouth 3 times daily as needed for muscle spasm. Warning: May cause drowsiness. 07/11/18   Mardella Layman, MD  divalproex (DEPAKOTE) 250 MG DR tablet Take 1 tablet (250 mg total) by mouth 2 (two) times daily. 01/06/20   Lomax, Amy, NP  levETIRAcetam (KEPPRA) 1000  MG tablet Take 1 tablet (1,000 mg total) by mouth 2 (two) times daily. 01/06/20   Lomax, Amy, NP  naproxen sodium (ANAPROX) 220 MG tablet Take 220-440 mg by mouth 2 (two) times daily as needed (for eye pain or headaches).    [provider]  traMADol (ULTRAM) 50 MG tablet Take 50 mg by mouth 3 (three) times daily as needed for pain. 02/16/20   [provider]  zolpidem (AMBIEN) 10 MG tablet Take 10 mg by mouth daily as needed for sleep.     [provider]  nebivolol (BYSTOLIC) 5 MG tablet Take 5 mg by mouth daily.    11/21/11  [provider]  Niacin-Simvastatin [facility]) 500-40 MG TB24 Take by mouth 1 day or 1 dose.    11/21/11  [provider]  omeprazole (PRILOSEC) 40 MG capsule Take 40 mg by mouth daily.    11/21/11  [provider]    Allergies    Cephalexin, Cephalosporins, Codeine, Morphine and related, Other, Pentazocine lactate, Prednisone, Rocephin [ceftriaxone sodium in dextrose], and Tegretol [carbamazepine]  Review of Systems   Review of Systems  All other systems reviewed and are negative. Ten systems reviewed and are negative for acute change, except as noted in the HPI.   Physical Exam Updated Vital Signs BP 115/85   Pulse 75   Temp (!) 97.5 F (36.4 C) (Oral)   Resp 15   Ht 5\' 4"  (1.626 m)   Wt 65.8 kg   SpO2 100%   BMI 24.89 kg/m   Physical Exam Vitals and nursing note reviewed.  Constitutional:      General: She is not in acute distress.    Appearance: Normal appearance. She is not ill-appearing, toxic-appearing or diaphoretic.  HENT:     Head: Normocephalic and atraumatic.     Right Ear: External ear normal.     Left Ear: External ear normal.     Nose: Nose normal.     Mouth/Throat:     Mouth: Mucous membranes are moist.     Pharynx: Oropharynx is clear. No oropharyngeal exudate or posterior oropharyngeal erythema.  Eyes:     Extraocular Movements: Extraocular movements intact.  Cardiovascular:     Rate  and Rhythm: Normal rate and regular rhythm.     Pulses: Normal pulses.     Heart sounds: Normal heart sounds. No murmur heard.  No friction rub. No gallop.   Pulmonary:     Effort: Pulmonary effort is normal. No respiratory distress.     Breath sounds: Normal breath sounds. No stridor. No wheezing, rhonchi or rales.  Abdominal:     General: Abdomen is flat. A surgical scar is present. Bowel sounds are normal.  There is no distension.     Palpations: Abdomen is soft.     Tenderness: There is abdominal tenderness in the suprapubic area and left lower quadrant. Negative signs include Murphy's sign and McBurney's sign.  Genitourinary:    Rectum: Guaiac result positive.     Comments: Female nursing chaperone present.  Normal-appearing anus.  No palpable external or internal hemorrhoids noted.  Small amount of brown stool appreciated with a small amount of red blood.  No tenderness appreciated throughout the exam. Musculoskeletal:        General: Normal range of motion.     Cervical back: Normal range of motion and neck supple. No tenderness.  Skin:    General: Skin is warm and dry.  Neurological:     General: No focal deficit present.     Mental Status: She is alert and oriented to person, place, and time.  Psychiatric:        Mood and Affect: Mood normal.        Behavior: Behavior normal.    ED Results / Procedures / Treatments   Labs (all labs ordered are listed, but only abnormal results are displayed) Labs Reviewed  COMPREHENSIVE METABOLIC PANEL - Abnormal; Notable for the following components:      Result Value   Calcium 8.4 (*)    Total Protein 6.2 (*)    All other components within normal limits  CBC - Abnormal; Notable for the following components:   RBC 3.57 (*)    Hemoglobin 9.9 (*)    HCT 31.8 (*)    RDW 16.6 (*)    All other components within normal limits  POC OCCULT BLOOD, ED - Abnormal; Notable for the following components:   Fecal Occult Bld POSITIVE (*)    All  other components within normal limits  PROTIME-INR  I-STAT BETA HCG BLOOD, ED ([facility], [facility], [facility] ONLY)  TYPE AND SCREEN  ABO/RH   EKG None  Radiology CT ABDOMEN PELVIS W CONTRAST  Result Date: 03/09/2020 CLINICAL DATA:  Nausea vomiting, intermittent diarrhea, constipation, bright red blood in stool since yesterday EXAM: CT ABDOMEN AND PELVIS WITH CONTRAST TECHNIQUE: Multidetector CT imaging of the abdomen and pelvis was performed using the standard protocol following bolus administration of intravenous contrast. CONTRAST:  OMNIPAQUE IOHEXOL 300 MG/ML  SOLN COMPARISON:  10/11/2017 FINDINGS: Lower chest: No acute pleural or parenchymal lung disease. Hepatobiliary: No focal liver abnormality is seen. Status post cholecystectomy. No biliary dilatation. Pancreas: Unremarkable. No pancreatic ductal dilatation or surrounding inflammatory changes. Spleen: Normal in size without focal abnormality. Adrenals/Urinary Tract: Adrenal glands are unremarkable. Kidneys are normal, without renal calculi, focal lesion, or hydronephrosis. Bladder is unremarkable. Stomach/Bowel: No bowel obstruction or ileus. Normal appendix right lower quadrant. No bowel wall thickening or inflammatory change. Postsurgical changes from bariatric surgery. Vascular/Lymphatic: No significant vascular findings are present. No enlarged abdominal or pelvic lymph nodes. Reproductive: Uterus and bilateral adnexa are unremarkable. Other: No abdominal wall hernia or abnormality. No abdominopelvic ascites. Musculoskeletal: No acute or destructive bony lesions. Reconstructed images demonstrate no additional findings. IMPRESSION: No acute intra-abdominal or intrapelvic process. Electronically Signed   By: Sharlet Salina M.D.   On: 03/09/2020 16:24   Procedures Procedures   Medications Ordered in ED Medications  sodium chloride (PF) 0.9 % injection (has no administration in time range)  iohexol (OMNIPAQUE) 300 MG/ML solution 100 mL (100 mLs  Intravenous Contrast Given 03/09/20 1603)  acetaminophen (TYLENOL) tablet 1,000 mg (1,000 mg Oral Given 03/09/20 1649)  ED Course  I have reviewed the triage vital signs and the nursing notes.  Pertinent labs & imaging results that were available during my care of the patient were reviewed by me and considered in my medical decision making (see chart for details).  Clinical Course as of Mar 09 1729  Mon Mar 09, 2020  1438 Down from 10.8, 1 year ago.  Hemoglobin(!): 9.9 [LJ]  1456 Fecal Occult Blood, POC(!): POSITIVE [LJ]  1457 Afebrile.  Temp(!): 97.5 F (36.4 C) [LJ]  1457 Not tachycardic.  Pulse Rate: 75 [LJ]  1457 I-stat hCG, quantitative: <5.0 [LJ]  1542 AST: 15 [LJ]  1543 ALT: 11 [LJ]  1645 No acute intra-abdominal or intrapelvic process.  CT ABDOMEN PELVIS W CONTRAST [LJ]    Clinical Course User Index [LJ] Placido Sou, PA-C   MDM Rules/Calculators/A&P                          Pt is a 44 y.o. female that present with a history, physical exam, ED Clinical Course as noted above.   Patient has a history of Roux-en-Y gastric bypass.  She was experiencing some mild lower abdominal discomfort.  She was fecal occult blood positive.  Due to this obtained a CT scan with IV contrast of the abdomen and pelvis.  This was negative for any acute abnormalities.  Patient has not been tachycardic at this stay.  She is saturating well. Afebrile. She is able to ambulate unassisted with a steady gait.  She was also discussed with and evaluated by my attending physician Dr. Derwood Kaplan.  Will discharge patient with strict return precautions.  She understands she needs to return to the emergency department with any new or worsening symptoms.  We will give patient referral to GI at [facility] gastroenterology.  Recommended that she reach out to them as soon as possible to schedule an appointment. Recommended that she stop taking ibuprofen and begin taking APAP for acute pain.   Her questions  were answered and she was amicable to time of discharge.  Her vital signs are stable.  Patient discharged to home/self care.  Condition at discharge: Stable  Note: Portions of this report may have been transcribed using voice recognition software. Every effort was made to ensure accuracy; however, inadvertent computerized transcription errors may be present.   Final Clinical Impression(s) / ED Diagnoses Final diagnoses:  Blood in stool   Rx / DC Orders ED Discharge Orders    None       Placido Sou, PA-C 03/09/20 1730    Derwood Kaplan, MD 03/10/20 1355

## 2020-03-09 NOTE — ED Triage Notes (Signed)
Patient states she began having constipation, N/V and intermittent diarrhea 6 months ago.  Patient states she has had bright red blood in her stool yesterday, N/V and abdominal cramping.  Patient also c/o migraine headache since yesterday.

## 2020-03-11 ENCOUNTER — Ambulatory Visit
Admission: RE | Admit: 2020-03-11 | Discharge: 2020-03-11 | Disposition: A | Discharge status: Home / Self Care | Payer: BC Managed Care – PPO | Source: Ambulatory Visit | Attending: Obstetrics and Gynecology | Admitting: Obstetrics and Gynecology

## 2020-03-11 ENCOUNTER — Other Ambulatory Visit: Payer: Self-pay

## 2020-03-11 ENCOUNTER — Ambulatory Visit
Admission: RE | Admit: 2020-03-11 | Discharge: 2020-03-11 | Disposition: A | Discharge status: Home / Self Care | Payer: BLUE CROSS/BLUE SHIELD | Source: Ambulatory Visit | Attending: Obstetrics and Gynecology | Admitting: Obstetrics and Gynecology

## 2020-03-11 ENCOUNTER — Telehealth: Payer: Self-pay | Admitting: *Deleted

## 2020-03-11 DIAGNOSIS — R928 Other abnormal and inconclusive findings on diagnostic imaging of breast: Secondary | ICD-10-CM

## 2020-03-11 NOTE — Telephone Encounter (Signed)
[place] DMV forms on MD desk for review, completion, signatures.

## 2020-03-12 NOTE — Telephone Encounter (Signed)
[place] DMV forms completed, signed, sent to medical records for processing. Sent my chart to patient to inform.

## 2020-03-18 ENCOUNTER — Telehealth: Payer: Self-pay | Admitting: *Deleted

## 2020-03-18 NOTE — Telephone Encounter (Signed)
Pt DMV form faxed on 03/12/20

## 2020-04-01 ENCOUNTER — Other Ambulatory Visit: Payer: Self-pay

## 2020-04-01 ENCOUNTER — Encounter (HOSPITAL_COMMUNITY): Payer: Self-pay

## 2020-04-01 ENCOUNTER — Emergency Department (HOSPITAL_COMMUNITY)
Admission: EM | Admit: 2020-04-01 | Discharge: 2020-04-01 | Disposition: A | Discharge status: Home / Self Care | Payer: BC Managed Care – PPO | Attending: Emergency Medicine | Admitting: Emergency Medicine

## 2020-04-01 DIAGNOSIS — I10 Essential (primary) hypertension: Secondary | ICD-10-CM | POA: Insufficient documentation

## 2020-04-01 DIAGNOSIS — Z79899 Other long term (current) drug therapy: Secondary | ICD-10-CM | POA: Insufficient documentation

## 2020-04-01 DIAGNOSIS — R569 Unspecified convulsions: Secondary | ICD-10-CM | POA: Diagnosis present

## 2020-04-01 DIAGNOSIS — R42 Dizziness and giddiness: Secondary | ICD-10-CM | POA: Insufficient documentation

## 2020-04-01 DIAGNOSIS — R55 Syncope and collapse: Secondary | ICD-10-CM | POA: Insufficient documentation

## 2020-04-01 LAB — COMPREHENSIVE METABOLIC PANEL
ALT: 16 U/L (ref 0–44)
AST: 17 U/L (ref 15–41)
Albumin: 3.2 g/dL — ABNORMAL LOW (ref 3.5–5.0)
Alkaline Phosphatase: 54 U/L (ref 38–126)
Anion gap: 8 (ref 5–15)
BUN: 11 mg/dL (ref 6–20)
CO2: 28 mmol/L (ref 22–32)
Calcium: 8.3 mg/dL — ABNORMAL LOW (ref 8.9–10.3)
Chloride: 103 mmol/L (ref 98–111)
Creatinine, Ser: 0.8 mg/dL (ref 0.44–1.00)
GFR calc Af Amer: >60 mL/min (ref >60)
GFR calc non Af Amer: >60 mL/min (ref >60)
Glucose, Bld: 96 mg/dL (ref 70–99)
Potassium: 4 mmol/L (ref 3.5–5.1)
Sodium: 139 mmol/L (ref 135–145)
Total Bilirubin: 0.3 mg/dL (ref 0.3–1.2)
Total Protein: 5.7 g/dL — ABNORMAL LOW (ref 6.5–8.1)

## 2020-04-01 LAB — HCG, QUANTITATIVE, PREGNANCY: hCG, Beta Chain, Quant, S: <1 m[IU]/mL (ref <5)

## 2020-04-01 LAB — VALPROIC ACID LEVEL: Valproic Acid Lvl: 49 ug/mL — ABNORMAL LOW (ref 50.0–100.0)

## 2020-04-01 LAB — ETHANOL: Alcohol, Ethyl (B): <10 mg/dL (ref <10)

## 2020-04-01 LAB — CBG MONITORING, ED: Glucose-Capillary: 97 mg/dL (ref 70–99)

## 2020-04-01 MED ORDER — SODIUM CHLORIDE 0.9 % IV BOLUS
1000.0000 mL | Freq: Once | INTRAVENOUS | Status: AC
  Administered 2020-04-01: 1000 mL via INTRAVENOUS

## 2020-04-01 MED ORDER — SODIUM CHLORIDE 0.9 % IV SOLN: INTRAVENOUS | Status: DC

## 2020-04-01 NOTE — ED Provider Notes (Signed)
[facility]   CSN: [id number] Arrival date & time: 04/01/20  2001     History Chief Complaint  Patient presents with  . Possible Siezure    Dizzyness    [name] is a 44 y.o. female.  Pt presents to the ED today with syncope vs seizure.  Pt was seen slumped over in her car at an intersection and EMS was called to the scene.  Pt has a hx of seizures and takes Keppra and Depakote.  Pt is unsure if she took her dose this morning, but did remember taking it last night.  Pt said she remembers going through the intersection and pulling over to call her son for directions to a [place].  That is the last she remembers.  She does not recall and prodromal sx.  Pt denies any f/c.  No sob or CP.  She was here on 7/12 for having blood in her stool.  Pt was told to follow up with GI.  She has tried, but the appointment has not yet been scheduled.        Past Medical History:  Diagnosis Date  . Arthritis    foot and knee  . Bruises easily   . GERD (gastroesophageal reflux disease)    resolved 5 yrs ago  . Headache, migraine   . Headaches, cluster   . 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 Hyperlipidemia   . Hypertension    resolved 5 yrs ago  . Incontinence   . Lumbago   . Plantar fasciitis   . Seizures (HCC)    seizure 11/23/15 and mild seizure 02/08/16  . Sinus problem   . Sore throat   . Wears glasses   . Wears glasses    reading glasses only    Patient Active Problem List   Diagnosis Date Noted  . Seizure disorder (HCC) 02/11/2019  . Hypertension 07/01/2011  . Hyperlipidemia 07/01/2011  . 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 CARPAL TUNNEL RELEASE Right 02/02/2011  . CARPAL TUNNEL RELEASE Left 01/22/2014   Procedure: LEFT CARPAL TUNNEL RELEASE;  Surgeon: Nicki Reaper, MD;  Location: [facility];  Service:  Orthopedics;  Laterality: Left;  . CHOLECYSTECTOMY  08/06/2004  . ESOPHAGOGASTRODUODENOSCOPY  03/14/2011  . GASTRIC BYPASS  03/14/11   lap. Roux-en-Y  . HYSTEROSCOPY WITH NOVASURE  10/15/2004  . PLANTAR FASCIA RELEASE Left 08/25/2009  . PLANTAR FASCIA RELEASE Right 01/02/2004  . TRIGGER FINGER RELEASE Right 06/24/2014   Procedure: RELEASE A-1 PULLEY RIGHT RING FINGER;  Surgeon: Cindee Salt, MD;  Location: [facility];  Service: Orthopedics;  Laterality: Right;  ANESTHESIA: IV REGIONAL FAB  . TUBAL LIGATION  1999     OB History   No obstetric history on file.     Family History  Problem Relation Age of Onset  . Gout Father   . Cancer Maternal Grandmother        breast  . Breast cancer Maternal Grandmother   . Breast cancer Mother   . Stroke Paternal Grandmother   . Dementia Paternal Grandmother     Social History   Tobacco Use  . Smoking status: Never Smoker  . Smokeless tobacco: Never Used  Vaping Use  . Vaping Use: Never used  Substance Use Topics  . Alcohol use: No  . Drug use: No  Home Medications Prior to Admission medications   Medication Sig Start Date End Date Taking? Authorizing Provider  ALPRAZolam Prudy Feeler) 0.25 MG tablet Take 0.25 mg by mouth daily as needed for anxiety. 02/26/20  Yes [provider]  divalproex (DEPAKOTE) 250 MG DR tablet Take 1 tablet (250 mg total) by mouth 2 (two) times daily. 01/06/20  Yes Lomax, Amy, NP  ibuprofen (ADVIL) 200 MG tablet Take 200 mg by mouth every 6 (six) hours as needed for moderate pain.   Yes [provider]  levETIRAcetam (KEPPRA) 1000 MG tablet Take 1 tablet (1,000 mg total) by mouth 2 (two) times daily. 01/06/20  Yes Lomax, Amy, NP  levocetirizine (XYZAL) 2.5 MG/5ML solution Take 2.5 mg by mouth every evening.   Yes [provider]  zolpidem (AMBIEN) 10 MG tablet Take 10 mg by mouth daily as needed for sleep.    Yes [provider]  amitriptyline (ELAVIL) 25 MG tablet Take  2 tablets (50 mg total) by mouth at bedtime. Patient not taking: Reported on 03/09/2020 10/03/19   Shawnie Dapper, NP  cyclobenzaprine (FLEXERIL) 10 MG tablet Take 1 tablet by mouth 3 times daily as needed for muscle spasm. Warning: May cause drowsiness. Patient not taking: Reported on 03/09/2020 07/11/18   Mardella Layman, MD  naproxen sodium (ANAPROX) 220 MG tablet Take 220-440 mg by mouth 2 (two) times daily as needed (for eye pain or headaches).    [provider]  nebivolol (BYSTOLIC) 5 MG tablet Take 5 mg by mouth daily.    11/21/11  [provider]  Niacin-Simvastatin [facility]) 500-40 MG TB24 Take by mouth 1 day or 1 dose.    11/21/11  [provider]  omeprazole (PRILOSEC) 40 MG capsule Take 40 mg by mouth daily.    11/21/11  [provider]    Allergies    Cephalexin, Cephalosporins, Codeine, Morphine and related, Other, Pentazocine lactate, Prednisone, Rocephin [ceftriaxone sodium in dextrose], Tegretol [carbamazepine], and Tramadol  Review of Systems   Review of Systems  Neurological: Positive for syncope.  All other systems reviewed and are negative.   Physical Exam Updated Vital Signs BP 127/89 (BP Location: Left Arm)   Pulse 98   Temp 98.8 F (37.1 C) (Oral)   Resp (!) 24   SpO2 100%   Physical Exam Vitals and nursing note reviewed.  Constitutional:      Appearance: Normal appearance.  HENT:     Head: Normocephalic and atraumatic.     Right Ear: External ear normal.     Left Ear: External ear normal.     Nose: Nose normal.     Mouth/Throat:     Mouth: Mucous membranes are moist.     Pharynx: Oropharynx is clear.  Eyes:     Extraocular Movements: Extraocular movements intact.     Conjunctiva/sclera: Conjunctivae normal.     Pupils: Pupils are equal, round, and reactive to light.  Cardiovascular:     Rate and Rhythm: Normal rate and regular rhythm.     Pulses: Normal pulses.     Heart sounds: Normal heart sounds.  Pulmonary:      Effort: Pulmonary effort is normal.     Breath sounds: Normal breath sounds.  Abdominal:     General: Abdomen is flat. Bowel sounds are normal.     Palpations: Abdomen is soft.  Musculoskeletal:        General: Normal range of motion.     Cervical back: Normal range of motion and neck supple.  Skin:    General: Skin is warm.     Capillary Refill: Capillary refill takes less than 2 seconds.  Neurological:     General: No focal deficit present.     Mental Status: She is alert and oriented to person, place, and time.  Psychiatric:        Mood and Affect: Mood normal.        Behavior: Behavior normal.        Thought Content: Thought content normal.        Judgment: Judgment normal.     ED Results / Procedures / Treatments   Labs (all labs ordered are listed, but only abnormal results are displayed) Labs Reviewed  COMPREHENSIVE METABOLIC PANEL - Abnormal; Notable for the following components:      Result Value   Calcium 8.3 (*)    Total Protein 5.7 (*)    Albumin 3.2 (*)    All other components within normal limits  VALPROIC ACID LEVEL - Abnormal; Notable for the following components:   Valproic Acid Lvl 49 (*)    All other components within normal limits  ETHANOL  HCG, QUANTITATIVE, PREGNANCY  CBC WITH DIFFERENTIAL/PLATELET  URINALYSIS, ROUTINE W REFLEX MICROSCOPIC  RAPID URINE DRUG SCREEN, HOSP PERFORMED  CBC WITH DIFFERENTIAL/PLATELET  CBG MONITORING, ED  I-STAT BETA HCG BLOOD, ED ([facility], [facility], [facility] ONLY)    EKG EKG Interpretation  Date/Time:  Wednesday April 01 2020 20:22:31 EDT Ventricular Rate:  104 PR Interval:    QRS Duration: 105 QT Interval:  349 QTC Calculation: 459 R Axis:   71 Text Interpretation: Sinus tachycardia No significant change since last tracing Confirmed by Jacalyn Lefevre [phone]) on 04/01/2020 9:01:35 PM   Radiology No results found.  Procedures Procedures (including critical care time)  Medications Ordered in ED Medications  sodium  chloride 0.9 % bolus 1,000 mL (0 mLs Intravenous Stopped 04/01/20 2210)    And  0.9 %  sodium chloride infusion (has no administration in time range)    ED Course  I have reviewed the triage vital signs and the nursing notes.  Pertinent labs & imaging results that were available during my care of the patient were reviewed by me and considered in my medical decision making (see chart for details).    MDM Rules/Calculators/A&P                          Pt is feeling a little better.  She is ready to go home and does not want to stay for a UA.  Pt has an appt with neurology on Sept. 15th.  However, her husband just lost his job with health insurance, so she's not sure if she will be able to go.  Pt instructed to return if worse.  Final Clinical Impression(s) / ED Diagnoses Final diagnoses:  Seizure [facility])    Rx / DC Orders ED Discharge Orders    None       Jacalyn Lefevre, MD 04/01/20 2216

## 2020-04-01 NOTE — ED Triage Notes (Signed)
Pt arrived via [facility] from public location on side of rode. Per EMS pt had pulled car off road, alert, but couldn't remember how or why she got there. Pt reports short term memory issue, dizziness, weakness blurred vision X 2 weeks. Per Ems unsteady gate on scene   20g RAC   Ns  Hx Seizures, anemia, gastric bypass surgery w/known slow bleed   HR 108 96% [id number]  120/86   Pt cell phone not present on arrival

## 2020-04-01 NOTE — ED Notes (Signed)
Pt verbalizes understanding of DC instructions. Pt belongings returned and is ambulatory out of ED.  

## 2020-04-05 ENCOUNTER — Other Ambulatory Visit: Payer: Self-pay | Admitting: Family Medicine

## 2020-04-06 MED ORDER — DIVALPROEX SODIUM 250 MG PO DR TAB: 250.0000 mg | DELAYED_RELEASE_TABLET | Freq: Two times a day (BID) | ORAL | 0 refills | Status: DC

## 2020-05-12 ENCOUNTER — Ambulatory Visit: Payer: Self-pay | Admitting: Family Medicine

## 2020-06-24 ENCOUNTER — Other Ambulatory Visit: Payer: Self-pay | Admitting: Neurology

## 2020-06-24 ENCOUNTER — Encounter: Payer: Self-pay | Admitting: Family Medicine

## 2020-06-24 ENCOUNTER — Other Ambulatory Visit: Payer: Self-pay | Admitting: Family Medicine

## 2020-06-24 MED ORDER — DIVALPROEX SODIUM 250 MG PO DR TAB: 250.0000 mg | DELAYED_RELEASE_TABLET | Freq: Two times a day (BID) | ORAL | 0 refills | Status: DC

## 2020-06-24 MED ORDER — LEVETIRACETAM 1000 MG PO TABS: 1000.0000 mg | ORAL_TABLET | Freq: Two times a day (BID) | ORAL | 0 refills | Status: DC

## 2020-07-09 ENCOUNTER — Other Ambulatory Visit: Payer: Self-pay | Admitting: *Deleted

## 2020-07-09 MED ORDER — DIVALPROEX SODIUM 250 MG PO DR TAB: 250.0000 mg | DELAYED_RELEASE_TABLET | Freq: Two times a day (BID) | ORAL | 1 refills | Status: DC

## 2020-08-24 ENCOUNTER — Ambulatory Visit (INDEPENDENT_AMBULATORY_CARE_PROVIDER_SITE_OTHER): Payer: BC Managed Care – PPO | Admitting: Family Medicine

## 2020-08-24 ENCOUNTER — Encounter: Payer: Self-pay | Admitting: Family Medicine

## 2020-08-24 VITALS — BP 124/80 | HR 104 | Ht 64.0 in | Wt 150.0 lb

## 2020-08-24 DIAGNOSIS — G40909 Epilepsy, unspecified, not intractable, without status epilepticus: Secondary | ICD-10-CM

## 2020-08-24 NOTE — Patient Instructions (Signed)
Below is our plan:  We will continue levetiracetam 1000mg  and divalproex 250mg  twice daily. Please follow closely with PCP. I recommend that you not drive until we figure out more about what has been going on.   Please make sure you are staying well hydrated. I recommend 50-60 ounces daily. Well balanced diet and regular exercise encouraged.   Please continue follow up with care team as directed.   Follow up in 3 months   You may receive a survey regarding today's visit. I encourage you to leave honest feed back as I do use this information to improve patient care. Thank you for seeing me today!   According to [place] law, you can not drive unless you are seizure / syncope free for at least 6 months and under physician's care.  Please maintain precautions. Do not participate in activities where a loss of awareness could harm you or someone else. No swimming alone, no tub bathing, no hot tubs, no driving, no operating motorized vehicles (cars, ATVs, motocycles, etc), lawnmowers, power tools or firearms. No standing at heights, such as rooftops, ladders or stairs. Avoid hot objects such as stoves, heaters, open fires. Wear a helmet when riding a bicycle, scooter, skateboard, etc. and avoid areas of traffic. Set your water heater to 120 degrees or less.       Seizure, Adult A seizure is a sudden burst of abnormal electrical activity in the brain. Seizures usually last from 30 seconds to 2 minutes. They can cause many different symptoms. Usually, seizures are not harmful unless they last a long time. What are the causes? Common causes of this condition include:  Fever or infection.  Conditions that affect the brain, such as: ? A brain abnormality that you were born with. ? A brain or head injury. ? Bleeding in the brain. ? A tumor. ? Stroke. ? Brain disorders such as autism or cerebral palsy.  Low blood sugar.  Conditions that are passed from parent to child (are  inherited).  Problems with substances, such as: ? Having a reaction to a drug or a medicine. ? Suddenly stopping the use of a substance (withdrawal). In some cases, the cause may not be known. A person who has repeated seizures over time without a clear cause has a condition called epilepsy. What increases the risk? You are more likely to get this condition if you have:  A family history of epilepsy.  Had a seizure in the past.  A brain disorder.  A history of head injury, lack of oxygen at birth, or strokes. What are the signs or symptoms? There are many types of seizures. The symptoms vary depending on the type of seizure you have. Examples of symptoms during a seizure include:  Shaking (convulsions).  Stiffness in the body.  Passing out (losing consciousness).  Head nodding.  Staring.  Not responding to sound or touch.  Loss of bladder control and bowel control. Some people have symptoms right before and right after a seizure happens. Symptoms before a seizure may include:  Fear.  Worry (anxiety).  Feeling like you may vomit (nauseous).  Feeling like the room is spinning (vertigo).  Feeling like you saw or heard something before (dj vu).  Odd tastes or smells.  Changes in how you see. You may see flashing lights or spots. Symptoms after a seizure happens can include:  Confusion.  Sleepiness.  Headache.  Weakness on one side of the body. How is this treated? Most seizures will stop on their  own in under 5 minutes. In these cases, no treatment is needed. Seizures that last longer than 5 minutes will usually need treatment. Treatment can include:  Medicines given through an IV tube.  Avoiding things that are known to cause your seizures. These can include medicines that you take for another condition.  Medicines to treat epilepsy.  Surgery to stop the seizures. This may be needed if medicines do not help. Follow these instructions at  home: Medicines  Take over-the-counter and prescription medicines only as told by your doctor.  Do not eat or drink anything that may keep your medicine from working, such as alcohol. Activity  Do not do any activities that would be dangerous if you had another seizure, like driving or swimming. Wait until your doctor says it is safe for you to do them.  If you live in the [place] ask your local DMV (department of motor vehicles) when you can drive.  Get plenty of rest. Teaching others Teach friends and family what to do when you have a seizure. They should:  Lay you on the ground.  Protect your head and body.  Loosen any tight clothing around your neck.  Turn you on your side.  Not hold you down.  Not put anything into your mouth.  Know whether or not you need emergency care.  Stay with you until you are better.  General instructions  Contact your doctor each time you have a seizure.  Avoid anything that gives you seizures.  Keep a seizure diary. Write down: ? What you think caused each seizure. ? What you remember about each seizure.  Keep all follow-up visits as told by your doctor. This is important. Contact a doctor if:  You have another seizure.  You have seizures more often.  There is any change in what happens during your seizures.  You keep having seizures with treatment.  You have symptoms of being sick or having an infection. Get help right away if:  You have a seizure that: ? Lasts longer than 5 minutes. ? Is different than seizures you had before. ? Makes it harder to breathe. ? Happens after you hurt your head.  You have any of these symptoms after a seizure: ? Not being able to speak. ? Not being able to use a part of your body. ? Confusion. ? A bad headache.  You have two or more seizures in a row.  You do not wake up right after a seizure.  You get hurt during a seizure. These symptoms may be an emergency. Do not wait to see if  the symptoms will go away. Get medical help right away. Call your local emergency services (911 in the [place] Do not drive yourself to the hospital. Summary  Seizures usually last from 30 seconds to 2 minutes. Usually, they are not harmful unless they last a long time.  Do not eat or drink anything that may keep your medicine from working, such as alcohol.  Teach friends and family what to do when you have a seizure.  Contact your doctor each time you have a seizure. This information is not intended to replace advice given to you by your health care provider. Make sure you discuss any questions you have with your health care provider. Document Revised: 11/02/2018 Document Reviewed: 11/02/2018 Elsevier Patient Education  2020 [employer].
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
## 2020-08-25 ENCOUNTER — Encounter: Payer: Self-pay | Admitting: Family Medicine

## 2020-08-27 LAB — VALPROIC ACID LEVEL: Valproic Acid Lvl: 11 ug/mL — ABNORMAL LOW (ref 50–100)

## 2020-08-27 LAB — LEVETIRACETAM LEVEL: Levetiracetam Lvl: 16.6 ug/mL (ref 10.0–40.0)

## 2020-09-13 ENCOUNTER — Other Ambulatory Visit: Payer: Self-pay | Admitting: Family Medicine

## 2020-09-15 ENCOUNTER — Telehealth: Payer: Self-pay | Admitting: Neurology

## 2020-09-15 MED ORDER — LEVETIRACETAM 1000 MG PO TABS: 1000.0000 mg | ORAL_TABLET | Freq: Two times a day (BID) | ORAL | 0 refills | Status: DC

## 2020-09-15 NOTE — Telephone Encounter (Signed)
sent Keppra 1000 mg bid  refills to [facility] [address] [place], [place] [address]

## 2020-11-04 ENCOUNTER — Other Ambulatory Visit: Payer: Self-pay | Admitting: Neurology

## 2020-11-24 ENCOUNTER — Encounter: Payer: Self-pay | Admitting: Family Medicine

## 2020-11-24 ENCOUNTER — Ambulatory Visit: Payer: Self-pay | Admitting: Family Medicine

## 2020-11-24 NOTE — Patient Instructions (Incomplete)
Below is our plan:  We will ***  Please make sure you are staying well hydrated. I recommend 50-60 ounces daily. Well balanced diet and regular exercise encouraged. Consistent sleep schedule with 6-8 hours recommended.   Please continue follow up with care team as directed.   Follow up with *** in ***  You may receive a survey regarding today's visit. I encourage you to leave honest feed back as I do use this information to improve patient care. Thank you for seeing me today!       Seizure, Adult A seizure is a sudden burst of abnormal electrical and chemical activity in the brain. Seizures usually last from 30 seconds to 2 minutes.  What are the causes? Common causes of this condition include:  Fever or infection.  Problems that affect the brain. These may include: ? A brain or head injury. ? Bleeding in the brain. ? A brain tumor.  Low levels of blood sugar or salt.  Kidney problems or liver problems.  Conditions that are passed from parent to child (are inherited).  Problems with a substance, such as: ? Having a reaction to a drug or a medicine. ? Stopping the use of a substance all of a sudden (withdrawal).  A stroke.  Disorders that affect how you develop. Sometimes, the cause may not be known.  What increases the risk?  Having someone in your family who has epilepsy. In this condition, seizures happen again and again over time. They have no clear cause.  Having had a tonic-clonic seizure before. This type of seizure causes you to: ? Tighten the muscles of the whole body. ? Lose consciousness.  Having had a head injury or strokes before.  Having had a lack of oxygen at birth. What are the signs or symptoms? There are many types of seizures. The symptoms vary depending on the type of seizure you have. Symptoms during a seizure  Shaking that you cannot control (convulsions) with fast, jerky movements of muscles.  Stiffness of the body.  Breathing  problems.  Feeling mixed up (confused).  Staring or not responding to sound or touch.  Head nodding.  Eyes that blink, flutter, or move fast.  Drooling, grunting, or making clicking sounds with your mouth  Losing control of when you pee or poop. Symptoms before a seizure  Feeling afraid, nervous, or worried.  Feeling like you may vomit.  Feeling like: ? You are moving when you are not. ? Things around you are moving when they are not.  Feeling like you saw or heard something before (dj vu).  Odd tastes or smells.  Changes in how you see. You may see flashing lights or spots. Symptoms after a seizure  Feeling confused.  Feeling sleepy.  Headache.  Sore muscles. How is this treated? If your seizure stops on its own, you will not need treatment. If your seizure lasts longer than 5 minutes, you will normally need treatment. Treatment may include:  Medicines given through an IV tube.  Avoiding things, such as medicines, that are known to cause your seizures.  Medicines to prevent seizures.  A device to prevent or control seizures.  Surgery.  A diet low in carbohydrates and high in fat (ketogenic diet). Follow these instructions at home: Medicines  Take over-the-counter and prescription medicines only as told by your doctor.  Avoid foods or drinks that may keep your medicine from working, such as alcohol. Activity  Follow instructions about driving, swimming, or doing things that would   be dangerous if you had another seizure. Wait until your doctor says it is safe for you to do these things.  If you live in the [place] ask your local department of motor vehicles when you can drive.  Get a lot of rest. Teaching others  Teach friends and family what to do when you have a seizure. They should: ? Help you get down to the ground. ? Protect your head and body. ? Loosen any clothing around your neck. ? Turn you on your side. ? Know whether or not you need  emergency care. ? Stay with you until you are better.  Also, tell them what not to do if you have a seizure. Tell them: ? They should not hold you down. ? They should not put anything in your mouth.   General instructions  Avoid anything that gives you seizures.  Keep a seizure diary. Write down: ? What you remember about each seizure. ? What you think caused each seizure.  Keep all follow-up visits. Contact a doctor if:  You have another seizure or seizures. Call the doctor each time you have a seizure.  The pattern of your seizures changes.  You keep having seizures with treatment.  You have symptoms of being sick or having an infection.  You are not able to take your medicine. Get help right away if:  You have any of these problems: ? A seizure that lasts longer than 5 minutes. ? Many seizures in a row and you do not feel better between seizures. ? A seizure that makes it harder to breathe. ? A seizure and you can no longer speak or use part of your body.  You do not wake up right after a seizure.  You get hurt during a seizure.  You feel confused or have pain right after a seizure. These symptoms may be an emergency. Get help right away. Call your local emergency services (911 in the [place]  Do not wait to see if the symptoms will go away.  Do not drive yourself to the hospital. Summary  A seizure is a sudden burst of abnormal electrical and chemical activity in the brain. Seizures normally last from 30 seconds to 2 minutes.  Causes of seizures include illness, injury to the head, low levels of blood sugar or salt, and certain conditions.  Most seizures will stop on their own in less than 5 minutes. Seizures that last longer than 5 minutes are a medical emergency and need treatment right away.  Many medicines are used to treat seizures. Take over-the-counter and prescription medicines only as told by your doctor. This information is not intended to replace  advice given to you by your health care provider. Make sure you discuss any questions you have with your health care provider. Document Revised: 02/21/2020 Document Reviewed: 02/21/2020 Elsevier Patient Education  2021 [employer]  
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
## 2021-02-03 ENCOUNTER — Encounter: Payer: Self-pay | Admitting: Family Medicine

## 2021-02-03 ENCOUNTER — Ambulatory Visit: Payer: Self-pay | Admitting: Family Medicine

## 2021-02-03 ENCOUNTER — Other Ambulatory Visit: Payer: Self-pay

## 2021-02-03 VITALS — BP 120/72 | HR 83 | Ht 64.0 in | Wt 134.6 lb

## 2021-02-03 DIAGNOSIS — G40909 Epilepsy, unspecified, not intractable, without status epilepticus: Secondary | ICD-10-CM

## 2021-02-03 NOTE — Patient Instructions (Signed)
Below is our plan:  We will continue divalproex 250mg  twice daily and levetiracetam 1000mg  twice daily.   Please make sure you are staying well hydrated. I recommend 50-60 ounces daily. Well balanced diet and regular exercise encouraged. Consistent sleep schedule with 6-8 hours recommended.   Please continue follow up with care team as directed.   Follow up with me in 1 year, sooner if needed   You may receive a survey regarding today's visit. I encourage you to leave honest feed back as I do use this information to improve patient care. Thank you for seeing me today!    According to [place] law, you can not drive unless you are seizure / syncope free for at least 6 months and under physician's care. Your last documented seizure was in 04/2020. As long as you remain seizure free, I do not have any specific driving restrictions from a neurological perspective.   Please maintain precautions. Do not participate in activities where a loss of awareness could harm you or someone else. No swimming alone, no tub bathing, no hot tubs, no driving, no operating motorized vehicles (cars, ATVs, motocycles, etc), lawnmowers, power tools or firearms. No standing at heights, such as rooftops, ladders or stairs. Avoid hot objects such as stoves, heaters, open fires. Wear a helmet when riding a bicycle, scooter, skateboard, etc. and avoid areas of traffic. Set your water heater to 120 degrees or less.    Seizure, Adult A seizure is a sudden burst of abnormal electrical and chemical activity in the brain. Seizures usually last from 30 seconds to 2 minutes.  What are the causes? Common causes of this condition include:  Fever or infection.  Problems that affect the brain. These may include: ? A brain or head injury. ? Bleeding in the brain. ? A brain tumor.  Low levels of blood sugar or salt.  Kidney problems or liver problems.  Conditions that are passed from parent to child (are inherited).  Problems  with a substance, such as: ? Having a reaction to a drug or a medicine. ? Stopping the use of a substance all of a sudden (withdrawal).  A stroke.  Disorders that affect how you develop. Sometimes, the cause may not be known.  What increases the risk?  Having someone in your family who has epilepsy. In this condition, seizures happen again and again over time. They have no clear cause.  Having had a tonic-clonic seizure before. This type of seizure causes you to: ? Tighten the muscles of the whole body. ? Lose consciousness.  Having had a head injury or strokes before.  Having had a lack of oxygen at birth. What are the signs or symptoms? There are many types of seizures. The symptoms vary depending on the type of seizure you have. Symptoms during a seizure  Shaking that you cannot control (convulsions) with fast, jerky movements of muscles.  Stiffness of the body.  Breathing problems.  Feeling mixed up (confused).  Staring or not responding to sound or touch.  Head nodding.  Eyes that blink, flutter, or move fast.  Drooling, grunting, or making clicking sounds with your mouth  Losing control of when you pee or poop. Symptoms before a seizure  Feeling afraid, nervous, or worried.  Feeling like you may vomit.  Feeling like: ? You are moving when you are not. ? Things around you are moving when they are not.  Feeling like you saw or heard something before (dj vu).  Odd tastes or  smells.  Changes in how you see. You may see flashing lights or spots. Symptoms after a seizure  Feeling confused.  Feeling sleepy.  Headache.  Sore muscles. How is this treated? If your seizure stops on its own, you will not need treatment. If your seizure lasts longer than 5 minutes, you will normally need treatment. Treatment may include:  Medicines given through an IV tube.  Avoiding things, such as medicines, that are known to cause your seizures.  Medicines to  prevent seizures.  A device to prevent or control seizures.  Surgery.  A diet low in carbohydrates and high in fat (ketogenic diet). Follow these instructions at home: Medicines  Take over-the-counter and prescription medicines only as told by your doctor.  Avoid foods or drinks that may keep your medicine from working, such as alcohol. Activity  Follow instructions about driving, swimming, or doing things that would be dangerous if you had another seizure. Wait until your doctor says it is safe for you to do these things.  If you live in the [place] ask your local department of motor vehicles when you can drive.  Get a lot of rest. Teaching others  Teach friends and family what to do when you have a seizure. They should: ? Help you get down to the ground. ? Protect your head and body. ? Loosen any clothing around your neck. ? Turn you on your side. ? Know whether or not you need emergency care. ? Stay with you until you are better.  Also, tell them what not to do if you have a seizure. Tell them: ? They should not hold you down. ? They should not put anything in your mouth.   General instructions  Avoid anything that gives you seizures.  Keep a seizure diary. Write down: ? What you remember about each seizure. ? What you think caused each seizure.  Keep all follow-up visits. Contact a doctor if:  You have another seizure or seizures. Call the doctor each time you have a seizure.  The pattern of your seizures changes.  You keep having seizures with treatment.  You have symptoms of being sick or having an infection.  You are not able to take your medicine. Get help right away if:  You have any of these problems: ? A seizure that lasts longer than 5 minutes. ? Many seizures in a row and you do not feel better between seizures. ? A seizure that makes it harder to breathe. ? A seizure and you can no longer speak or use part of your body.  You do not wake up  right after a seizure.  You get hurt during a seizure.  You feel confused or have pain right after a seizure. These symptoms may be an emergency. Get help right away. Call your local emergency services (911 in the [place]  Do not wait to see if the symptoms will go away.  Do not drive yourself to the hospital. Summary  A seizure is a sudden burst of abnormal electrical and chemical activity in the brain. Seizures normally last from 30 seconds to 2 minutes.  Causes of seizures include illness, injury to the head, low levels of blood sugar or salt, and certain conditions.  Most seizures will stop on their own in less than 5 minutes. Seizures that last longer than 5 minutes are a medical emergency and need treatment right away.  Many medicines are used to treat seizures. Take over-the-counter and prescription medicines only as told  by your doctor. This information is not intended to replace advice given to you by your health care provider. Make sure you discuss any questions you have with your health care provider. Document Revised: 02/21/2020 Document Reviewed: 02/21/2020 Elsevier Patient Education  2021 [employer].
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
## 2021-02-11 ENCOUNTER — Emergency Department (HOSPITAL_COMMUNITY): Payer: BC Managed Care – PPO

## 2021-02-11 ENCOUNTER — Encounter (HOSPITAL_COMMUNITY): Payer: Self-pay

## 2021-02-11 ENCOUNTER — Emergency Department (HOSPITAL_COMMUNITY)
Admission: EM | Admit: 2021-02-11 | Discharge: 2021-02-11 | Disposition: A | Discharge status: Home / Self Care | Payer: BC Managed Care – PPO | Attending: Emergency Medicine | Admitting: Emergency Medicine

## 2021-02-11 ENCOUNTER — Other Ambulatory Visit: Payer: Self-pay

## 2021-02-11 DIAGNOSIS — Z20822 Contact with and (suspected) exposure to covid-19: Secondary | ICD-10-CM | POA: Insufficient documentation

## 2021-02-11 DIAGNOSIS — J029 Acute pharyngitis, unspecified: Secondary | ICD-10-CM | POA: Diagnosis present

## 2021-02-11 DIAGNOSIS — R1031 Right lower quadrant pain: Secondary | ICD-10-CM | POA: Diagnosis not present

## 2021-02-11 DIAGNOSIS — Z79899 Other long term (current) drug therapy: Secondary | ICD-10-CM | POA: Diagnosis not present

## 2021-02-11 DIAGNOSIS — R112 Nausea with vomiting, unspecified: Secondary | ICD-10-CM | POA: Diagnosis not present

## 2021-02-11 DIAGNOSIS — R519 Headache, unspecified: Secondary | ICD-10-CM | POA: Insufficient documentation

## 2021-02-11 DIAGNOSIS — B349 Viral infection, unspecified: Secondary | ICD-10-CM | POA: Insufficient documentation

## 2021-02-11 DIAGNOSIS — I10 Essential (primary) hypertension: Secondary | ICD-10-CM | POA: Insufficient documentation

## 2021-02-11 DIAGNOSIS — R000 Tachycardia, unspecified: Secondary | ICD-10-CM | POA: Insufficient documentation

## 2021-02-11 LAB — COMPREHENSIVE METABOLIC PANEL
ALT: 16 U/L (ref 0–44)
AST: 17 U/L (ref 15–41)
Albumin: 3.6 g/dL (ref 3.5–5.0)
Alkaline Phosphatase: 50 U/L (ref 38–126)
Anion gap: 6 (ref 5–15)
BUN: 22 mg/dL — ABNORMAL HIGH (ref 6–20)
CO2: 28 mmol/L (ref 22–32)
Calcium: 8.9 mg/dL (ref 8.9–10.3)
Chloride: 105 mmol/L (ref 98–111)
Creatinine, Ser: 0.81 mg/dL (ref 0.44–1.00)
GFR, Estimated: >60 mL/min (ref >60)
Glucose, Bld: 106 mg/dL — ABNORMAL HIGH (ref 70–99)
Potassium: 3.9 mmol/L (ref 3.5–5.1)
Sodium: 139 mmol/L (ref 135–145)
Total Bilirubin: 0.3 mg/dL (ref 0.3–1.2)
Total Protein: 6.6 g/dL (ref 6.5–8.1)

## 2021-02-11 LAB — URINALYSIS, ROUTINE W REFLEX MICROSCOPIC
Bilirubin Urine: NEGATIVE
Glucose, UA: NEGATIVE mg/dL
Hgb urine dipstick: NEGATIVE
Ketones, ur: NEGATIVE mg/dL
Leukocytes,Ua: NEGATIVE
Nitrite: NEGATIVE
Protein, ur: NEGATIVE mg/dL
Specific Gravity, Urine: 1.015 (ref 1.005–1.030)
pH: 8.5 — ABNORMAL HIGH (ref 5.0–8.0)

## 2021-02-11 LAB — CBC WITH DIFFERENTIAL/PLATELET
Abs Immature Granulocytes: 0.02 10*3/uL (ref 0.00–0.07)
Basophils Absolute: 0 10*3/uL (ref 0.0–0.1)
Basophils Relative: 0 %
Eosinophils Absolute: 0.1 10*3/uL (ref 0.0–0.5)
Eosinophils Relative: 2 %
HCT: 35.4 % — ABNORMAL LOW (ref 36.0–46.0)
Hemoglobin: 11.2 g/dL — ABNORMAL LOW (ref 12.0–15.0)
Immature Granulocytes: 1 %
Lymphocytes Relative: 5 %
Lymphs Abs: 0.2 10*3/uL — ABNORMAL LOW (ref 0.7–4.0)
MCH: 27.7 pg (ref 26.0–34.0)
MCHC: 31.6 g/dL (ref 30.0–36.0)
MCV: 87.4 fL (ref 80.0–100.0)
Monocytes Absolute: 0.3 10*3/uL (ref 0.1–1.0)
Monocytes Relative: 7 %
Neutro Abs: 3.6 10*3/uL (ref 1.7–7.7)
Neutrophils Relative %: 85 %
Platelets: 151 10*3/uL (ref 150–400)
RBC: 4.05 MIL/uL (ref 3.87–5.11)
RDW: 16 % — ABNORMAL HIGH (ref 11.5–15.5)
WBC: 4.2 10*3/uL (ref 4.0–10.5)
nRBC: 0 % (ref 0.0–0.2)

## 2021-02-11 LAB — RESP PANEL BY RT-PCR (FLU A&B, COVID) ARPGX2
Influenza A by PCR: NEGATIVE
Influenza B by PCR: NEGATIVE
SARS Coronavirus 2 by RT PCR: NEGATIVE

## 2021-02-11 LAB — LIPASE, BLOOD: Lipase: 29 U/L (ref 11–51)

## 2021-02-11 MED ORDER — SODIUM CHLORIDE 0.9 % IV BOLUS
500.0000 mL | Freq: Once | INTRAVENOUS | Status: AC
  Administered 2021-02-11: 500 mL via INTRAVENOUS

## 2021-02-11 MED ORDER — ACETAMINOPHEN 325 MG PO TABS
650.0000 mg | ORAL_TABLET | Freq: Once | ORAL | Status: AC
  Administered 2021-02-11: 650 mg via ORAL
  Filled 2021-02-11: qty 2

## 2021-02-11 MED ORDER — ONDANSETRON HCL 4 MG/2ML IJ SOLN
4.0000 mg | Freq: Once | INTRAMUSCULAR | Status: AC
  Administered 2021-02-11: 4 mg via INTRAVENOUS
  Filled 2021-02-11: qty 2

## 2021-02-11 MED ORDER — KETOROLAC TROMETHAMINE 30 MG/ML IJ SOLN
30.0000 mg | Freq: Once | INTRAMUSCULAR | Status: AC
  Administered 2021-02-11: 30 mg via INTRAVENOUS
  Filled 2021-02-11: qty 1

## 2021-02-11 MED ORDER — IOHEXOL 300 MG/ML  SOLN
75.0000 mL | Freq: Once | INTRAMUSCULAR | Status: AC | PRN
  Administered 2021-02-11: 75 mL via INTRAVENOUS

## 2021-02-11 NOTE — ED Triage Notes (Signed)
Pt presents to ED with c/o headache, stomach cramps, fever of 103.6 about 0830. Started feeling unwell last night. Endorses sore throat and body aches. C/o N/V for several days. Home cpvid test negative but has has false neg in the past. Hx severe anemia (usu 7) and GI bleed. Noted blood in stool which happens off and on. Daughter's family has had covid, whom pt has seen weekly.

## 2021-02-11 NOTE — ED Provider Notes (Signed)
[facility]-EMERGENCY DEPT Provider Note   CSN: [id number] Arrival date & time: 02/11/21  1018     History Chief Complaint  Patient presents with   Fever   Abdominal Pain   Nausea   Emesis    [name] is a 45 y.o. female past medical history of Roux-en-Y gastric bypass, hypertension, hyperlipidemia, GERD, seizure disorder on Keppra and Depakote.  Patient states symptoms began yesterday with body aches, sore throat, headache.  She also has abdominal cramping worse in the right lower abdomen.  She has nausea and vomiting for several days.  She is concerned for dehydration.  No diarrhea, some constipation though states she fluctuates between diarrhea and constipation chronically.  She had fever this morning of 100.8 F, 103 F prior to leaving her house to come to the ED.  She did not treat her symptoms today though took some Tylenol last night.  She has Zofran at home for nausea though has not treated her nausea.  Is  not due to take her antiepileptics until around 2 PM today.  States her family has COVID and has been passing it around.  Sees her family about once a week, took a negative home COVID test.  She is vaccinated and boosted against COVID.  No cough or urinary sx.    tThe history is provided by the patient.      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   Hyperlipidemia    Hypertension    resolved 5 yrs ago   Incontinence    Lumbago    Plantar fasciitis    Seizures (HCC)    seizure 11/23/15 and mild seizure 02/08/16   Sinus problem    Sore throat    Wears glasses    Wears glasses    reading glasses only    Patient Active Problem List   Diagnosis Date Noted   Seizure disorder (HCC) 02/11/2019   Hypertension 07/01/2011    Hyperlipidemia 07/01/2011   Lap Roux Y Gastric Bypass July 2012 04/04/2011    Past Surgical History:  Procedure Laterality Date   CARPAL TUNNEL RELEASE Right 02/02/2011   CARPAL TUNNEL RELEASE Left 01/22/2014   Procedure: LEFT CARPAL TUNNEL RELEASE;  Surgeon: Nicki ReaperGary R Kuzma, MD;  Location: [facility];  Service: Orthopedics;  Laterality: Left;   CHOLECYSTECTOMY  08/06/2004   ESOPHAGOGASTRODUODENOSCOPY  03/14/2011   GASTRIC BYPASS  03/14/11   lap. Roux-en-Y   HYSTEROSCOPY WITH NOVASURE  10/15/2004   PLANTAR FASCIA RELEASE Left 08/25/2009   PLANTAR FASCIA RELEASE Right 01/02/2004   TRIGGER FINGER RELEASE Right 06/24/2014   Procedure: RELEASE A-1 PULLEY RIGHT RING FINGER;  Surgeon: Cindee SaltGary Kuzma, MD;  Location: [facility];  Service: Orthopedics;  Laterality: Right;  ANESTHESIA: IV REGIONAL FAB   TUBAL LIGATION  1999     OB History   No obstetric history on file.     Family History  Problem Relation Age of Onset   Gout Father    Cancer Maternal Grandmother        breast   Breast cancer Maternal Grandmother    Breast cancer Mother    Stroke Paternal Grandmother    Dementia Paternal Grandmother  Social History   Tobacco Use   Smoking status: Never   Smokeless tobacco: Never  Vaping Use   Vaping Use: Never used  Substance Use Topics   Alcohol use: No   Drug use: No    Home Medications Prior to Admission medications   Medication Sig Start Date End Date Taking? Authorizing Provider  baclofen (LIORESAL) 10 MG tablet Take 10 mg by mouth at bedtime. 10/10/20   [provider]  cetirizine (ZYRTEC) 10 MG tablet Take 10 mg by mouth daily. 11/05/20   [provider]  ciprofloxacin (CIPRO) 500 MG tablet Take 1 tablet by mouth every 12 (twelve) hours. 01/29/21   [provider]  divalproex (DEPAKOTE) 250 MG DR tablet Take 1 tablet (250 mg total) by mouth 2 (two) times daily. 07/09/20   Lomax, Amy, NP  ibuprofen (ADVIL) 200 MG tablet Take  200 mg by mouth every 6 (six) hours as needed for moderate pain.    [provider]  levETIRAcetam (KEPPRA) 1000 MG tablet TAKE 1 TABLET BY MOUTH TWICE A DAY 11/04/20   Lomax, Amy, NP  naproxen sodium (ANAPROX) 220 MG tablet Take 220-440 mg by mouth 2 (two) times daily as needed (for eye pain or headaches).    [provider]  ondansetron (ZOFRAN-ODT) 8 MG disintegrating tablet Take 8 mg by mouth every 8 (eight) hours as needed for nausea or vomiting.    [provider]  zolpidem (AMBIEN) 10 MG tablet Take 10 mg by mouth daily as needed for sleep.    [provider]  nebivolol (BYSTOLIC) 5 MG tablet Take 5 mg by mouth daily.    11/21/11  [provider]  Niacin-Simvastatin [facility]) 500-40 MG TB24 Take by mouth 1 day or 1 dose.    11/21/11  [provider]  omeprazole (PRILOSEC) 40 MG capsule Take 40 mg by mouth daily.    11/21/11  [provider]    Allergies    Cephalexin, Cephalosporins, Codeine, Morphine and related, Other, Pentazocine lactate, Prednisone, Rocephin [ceftriaxone sodium in dextrose], Tegretol [carbamazepine], and Tramadol  Review of Systems   Review of Systems  Constitutional:  Positive for fever.  HENT:  Positive for sore throat.   Respiratory:  Negative for cough and shortness of breath.   Gastrointestinal:  Positive for abdominal pain, constipation, nausea and vomiting.  Genitourinary:  Negative for dysuria and frequency.  Musculoskeletal:  Positive for myalgias (generalized).  Neurological:  Positive for headaches.  All other systems reviewed and are negative.  Physical Exam Updated Vital Signs BP 110/70 (BP Location: Right Arm)   Pulse (!) 111   Temp 99 F (37.2 C) (Oral)   Resp 18   Ht 5\' 4"  (1.626 m)   Wt 63 kg   SpO2 98%   BMI 23.86 kg/m   Physical Exam Vitals and nursing note reviewed.  Constitutional:      General: She is not in acute distress.    Appearance: She is well-developed.  HENT:      Head: Normocephalic and atraumatic.  Eyes:     Conjunctiva/sclera: Conjunctivae normal.  Cardiovascular:     Rate and Rhythm: Regular rhythm. Tachycardia present.  Pulmonary:     Effort: Pulmonary effort is normal. No respiratory distress.     Breath sounds: Normal breath sounds.  Abdominal:     General: Abdomen is flat. Bowel sounds are normal.     Palpations: Abdomen is soft.     Tenderness: There is abdominal tenderness in the right  lower quadrant. There is no guarding or rebound.  Skin:    General: Skin is warm.  Neurological:     Mental Status: She is alert.  Psychiatric:        Behavior: Behavior normal.    ED Results / Procedures / Treatments   Labs (all labs ordered are listed, but only abnormal results are displayed) Labs Reviewed  CBC WITH DIFFERENTIAL/PLATELET - Abnormal; Notable for the following components:      Result Value   Hemoglobin 11.2 (*)    HCT 35.4 (*)    RDW 16.0 (*)    Lymphs Abs 0.2 (*)    All other components within normal limits  COMPREHENSIVE METABOLIC PANEL - Abnormal; Notable for the following components:   Glucose, Bld 106 (*)    BUN 22 (*)    All other components within normal limits  URINALYSIS, ROUTINE W REFLEX MICROSCOPIC - Abnormal; Notable for the following components:   pH 8.5 (*)    All other components within normal limits  RESP PANEL BY RT-PCR (FLU A&B, COVID) ARPGX2  URINE CULTURE  LIPASE, BLOOD    EKG None  Radiology CT Abdomen Pelvis W Contrast  Result Date: 02/11/2021 CLINICAL DATA:  Abdominal pain and fever.  Crampy pain. EXAM: CT ABDOMEN AND PELVIS WITH CONTRAST TECHNIQUE: Multidetector CT imaging of the abdomen and pelvis was performed using the standard protocol following bolus administration of intravenous contrast. CONTRAST:  85mL OMNIPAQUE IOHEXOL 300 MG/ML  SOLN COMPARISON:  Radiography same day.  CT 03/09/2020. FINDINGS: Lower chest: Lung bases are clear. Hepatobiliary: Previous cholecystectomy. Liver  parenchyma is normal. Pancreas: Normal Spleen: Normal Adrenals/Urinary Tract: Adrenal glands are normal. Kidneys are normal except for a nonobstructing 1 mm stone in the midportion and lower pole of the left kidney. No evidence of passing stone. Bladder appears normal. Stomach/Bowel: Previous Roux-en-Y gastric bypass. No complicating feature in that region. Small and large bowel appear normal presently. No evidence of enteritis. Normal appearing appendix. Colon appears normal. No evidence of diverticulosis or diverticulitis. Vascular/Lymphatic: Normal Reproductive: Normal.  No pelvic mass. Other: No free fluid or air. Musculoskeletal: Normal IMPRESSION: No acute finding to explain the clinical presentation. No evidence of acute abdominal organ pathology. 2 punctate nonobstructing stones in the left kidney. 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 Previous cholecystectomy. No evidence acute bowel pathology of any sort. The appendix is normal. Electronically Signed   By: Paulina Fusi M.D.   On: 02/11/2021 13:06   DG Abdomen Acute W/Chest  Result Date: 02/11/2021 CLINICAL DATA:  Lower abdominal pain, constipation and fever. EXAM: DG ABDOMEN ACUTE WITH 1 VIEW CHEST COMPARISON:  None. FINDINGS: One-view chest shows normal heart and mediastinal shadows. The lungs are clear. No free air under the diaphragm. Abdominal images show a normal bowel gas pattern without a grossly abnormal amount of fecal matter. Evidence of ileus, obstruction or free air. Clips in the right upper quadrant consistent with previous cholecystectomy. IMPRESSION: Negative acute abdominal series. No evidence of increased amount of stool. Electronically Signed   By: Paulina Fusi M.D.   On: 02/11/2021 11:31    Procedures Procedures   Medications Ordered in ED Medications  acetaminophen (TYLENOL) tablet 650 mg (650 mg Oral Given 02/11/21 1117)  sodium chloride 0.9 % bolus 500 mL (0 mLs Intravenous Stopped 02/11/21 1215)   ondansetron (ZOFRAN) injection 4 mg (4 mg Intravenous Given 02/11/21 1125)  iohexol (OMNIPAQUE) 300 MG/ML solution 75 mL (75 mLs Intravenous Contrast Given 02/11/21 1241)  sodium chloride  0.9 % bolus 500 mL (0 mLs Intravenous Stopped 02/11/21 1447)  ketorolac (TORADOL) 30 MG/ML injection 30 mg (30 mg Intravenous Given 02/11/21 1328)    ED Course  I have reviewed the triage vital signs and the nursing notes.  Pertinent labs & imaging results that were available during my care of the patient were reviewed by me and considered in my medical decision making (see chart for details).    MDM Rules/Calculators/A&P                          Patient with recent exposure to COVID, presenting for fever, body aches, sore throat, abdominal upset, HA.  Initially denies urinary symptoms, however on reevaluation states about a week ago she finished treatment for UTI and had symptom resolution.  On exam she is in no acute distress.  She is tachycardic with low-grade fever, normotensive.  Abdomen is soft though does have tenderness in the right lower quadrant.  No guarding or rebound.  Lungs are clear.  Labs and covid swab ordered. Small amount of fluids for tachycardia, pending COVId swab. Antipyretic, antiemetic.  COVID and flu swab is negative.  Blood work is overall reassuring, no leukocytosis, no significant electrolyte abnormality, UA is negative, lipase within normal limits. COVID swab being negative and patient is febrile with abdominal pain with history of abdominal surgery, therefore CT scan was obtained.   CT is negative for acute findings.  Heart rate normalized with IV fluids and patient reports significant improvement in symptoms especially headache after Toradol.  Suspect viral etiology, no indication for abx today. Recommend symptomatic management, monitor symptoms.  Suggest repeat COVID swab in a few days if remains symptomatic due to recent exposure.  Return precautions discussed.  Patient is in  agreement with plan, discharged in no distress.  Discussed results, findings, treatment and follow up. Patient advised of return precautions. Patient verbalized understanding and agreed with plan.  Final Clinical Impression(s) / ED Diagnoses Final diagnoses:  Viral illness    Rx / DC Orders ED Discharge Orders     None        Jadwiga Faidley, [place] N, PA-C 02/11/21 1500    Jacalyn Lefevre, MD 02/11/21 1513

## 2021-02-11 NOTE — Discharge Instructions (Addendum)
Continue treating her symptoms with Tylenol, Zofran as directed.  Clear liquids until your stomach feels better.  It is recommended you isolate at home until your symptoms improve.  Follow with primary care.  Return if symptoms significantly worsen.

## 2021-02-13 LAB — URINE CULTURE: Culture: NO GROWTH

## 2021-03-04 ENCOUNTER — Encounter: Payer: Self-pay | Admitting: Family Medicine

## 2021-03-04 ENCOUNTER — Other Ambulatory Visit: Payer: Self-pay | Admitting: Neurology

## 2021-03-04 MED ORDER — DIVALPROEX SODIUM 250 MG PO DR TAB
250.0000 mg | DELAYED_RELEASE_TABLET | Freq: Two times a day (BID) | ORAL | 1 refills | Status: DC
Start: 2021-03-04 — End: 2021-08-04

## 2021-03-04 MED ORDER — LEVETIRACETAM 1000 MG PO TABS
1000.0000 mg | ORAL_TABLET | Freq: Two times a day (BID) | ORAL | 2 refills | Status: DC
Start: 2021-03-04 — End: 2021-08-24

## 2021-04-29 ENCOUNTER — Telehealth: Payer: Self-pay | Admitting: Family Medicine

## 2021-04-29 NOTE — Telephone Encounter (Signed)
Called the patient back. She was unable to truly give a example of what is going on. She states more recently she is beginning to fall. She doesn't know what is causing the falls. Denies that this is related to seizures. She states her "son and husband will not rest until she has updated imaging"  Spoke with Amy and she states since this is a new concerns and not related to Seizures patient should follow up with PCP first. They should complete a work up and decide if imaging is necessary.

## 2021-04-29 NOTE — Telephone Encounter (Signed)
Called the patient back to advise that given we have been following care for seizures the frequent falls is a new concern that would need to be evaluated by PCP first. Advised that if she begins experiencing concerns with weakness on one side of the body, slurred speech or difficulty getting words out then she should go to ER to make sure there is nothing new going on.

## 2021-04-29 NOTE — Telephone Encounter (Signed)
Pt called she states that her husband and son want her to see the doctor and now the NP. She states she has fallen 8 times since she was last seen. Pt is requesting a call back.

## 2021-07-16 ENCOUNTER — Encounter: Payer: Self-pay | Admitting: Family Medicine

## 2021-07-20 ENCOUNTER — Telehealth: Payer: Self-pay | Admitting: Hematology and Oncology

## 2021-07-20 NOTE — Telephone Encounter (Signed)
Scheduled appt per 11/22 referral. Pt is aware of appt date and time.  

## 2021-08-04 ENCOUNTER — Other Ambulatory Visit: Payer: Self-pay

## 2021-08-04 MED ORDER — DIVALPROEX SODIUM 250 MG PO DR TAB: 250.0000 mg | DELAYED_RELEASE_TABLET | Freq: Two times a day (BID) | ORAL | 1 refills | Status: DC

## 2021-08-16 ENCOUNTER — Inpatient Hospital Stay: Payer: BC Managed Care – PPO | Admitting: Hematology and Oncology

## 2021-08-16 ENCOUNTER — Telehealth: Payer: Self-pay | Admitting: Hematology and Oncology

## 2021-08-16 ENCOUNTER — Inpatient Hospital Stay: Payer: BC Managed Care – PPO

## 2021-08-16 NOTE — Telephone Encounter (Signed)
Pt had tested positive for covid. R/s pt's new hem appt, pt is aware of new appt date and time.

## 2021-08-23 ENCOUNTER — Other Ambulatory Visit: Payer: Self-pay | Admitting: Neurology

## 2021-09-06 ENCOUNTER — Inpatient Hospital Stay: Payer: BC Managed Care – PPO

## 2021-09-06 ENCOUNTER — Inpatient Hospital Stay: Payer: BC Managed Care – PPO | Attending: Hematology and Oncology | Admitting: Hematology and Oncology

## 2021-09-10 ENCOUNTER — Telehealth: Payer: Self-pay | Admitting: Physician Assistant

## 2021-09-10 NOTE — Telephone Encounter (Signed)
Pt missed new hem appt on 1/9. I called pt today and spoke to her. R/s appt for next available date and worked for pts schedule. She is aware of new appt date and time.

## 2021-10-04 NOTE — Progress Notes (Deleted)
[place] [phone]   [phone]  INITIAL CONSULT NOTE  Patient Care Team: Enid Skeens., MD as PCP - General (Family Medicine) Himmelrich, Bryson Ha, RD (Inactive) as Dietitian  Hematological/Oncological History 1) Workup with Dr. Nadean Corwin at [place]: -06/03/2021: Labs reveled WBC 3.8 (L), Hgb 10.1 (L), Plt 187 -06/17/2021: Colonoscopy: Internal hemorrhoids without bleeding. EGD: Esophageal mucosal changes suggestive of eosinophilic esophagitis. A gastric bypass was found, characterized by health appearing mucosa. Previous surgical anastomosis, characterized by healthy appearing mucosa was found in the jejunum.   2) 10/05/2021: Establish care with [facility]   CHIEF COMPLAINTS/PURPOSE OF CONSULTATION:  "Iron deficiency anemia "  HISTORY OF PRESENTING ILLNESS:  [name] 46 y.o. female with medical history significant for gastric bypass  On review of the previous records ***  On exam today ***  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   Hyperlipidemia    Hypertension    resolved 5 yrs ago   Incontinence    Lumbago    Plantar fasciitis    Seizures ([place])    seizure 11/23/15 and mild seizure 02/08/16   Sinus problem    Sore throat    Wears glasses    Wears glasses    reading glasses only    SURGICAL HISTORY: Past Surgical History:  Procedure Laterality Date   CARPAL TUNNEL RELEASE Right 02/02/2011   CARPAL TUNNEL RELEASE Left 01/22/2014   Procedure: LEFT CARPAL TUNNEL RELEASE;  Surgeon: Wynonia Sours, MD;  Location: [place];  Service: Orthopedics;  Laterality: Left;   CHOLECYSTECTOMY  08/06/2004   ESOPHAGOGASTRODUODENOSCOPY  03/14/2011   GASTRIC BYPASS  03/14/11    lap. Roux-en-Y   HYSTEROSCOPY WITH NOVASURE  10/15/2004   PLANTAR FASCIA RELEASE Left 08/25/2009   PLANTAR FASCIA RELEASE Right 01/02/2004   TRIGGER FINGER RELEASE Right 06/24/2014   Procedure: RELEASE A-1 PULLEY RIGHT RING FINGER;  Surgeon: Daryll Brod, MD;  Location: [place];  Service: Orthopedics;  Laterality: Right;  ANESTHESIA: IV REGIONAL FAB   TUBAL LIGATION  1999    SOCIAL HISTORY: Social History   Socioeconomic History   Marital status: Married    Spouse name: Aaron Edelman   Number of children: 2   Years of education: 14   Highest education level: Not on file  Occupational History   Not on file  Tobacco Use   Smoking status: Never   Smokeless tobacco: Never  Vaping Use   Vaping Use: Never used  Substance and Sexual Activity   Alcohol use: No   Drug use: No   Sexual activity: Never  Other Topics Concern   Not on file  Social History Narrative   Lives at home with husband   Caffeine use- sodas, 3 20 oz bottles daily   Social Determinants of Health   Financial Resource Strain: Not on file  Food Insecurity: Not on file  Transportation Needs: Not on file  Physical Activity: Not on file  Stress: Not on file  Social Connections: Not on file  Intimate Partner Violence: Not on file    FAMILY HISTORY: Family History  Problem Relation Age of Onset   Gout Father  Cancer Maternal Grandmother        breast   Breast cancer Maternal Grandmother    Breast cancer Mother    Stroke Paternal Grandmother    Dementia Paternal Grandmother     ALLERGIES:  is allergic to cephalexin, cephalosporins, codeine, morphine and related, other, pentazocine lactate, prednisone, rocephin [ceftriaxone sodium in dextrose], tegretol [carbamazepine], and tramadol.  MEDICATIONS:  Current Outpatient Medications  Medication Sig Dispense Refill   acetaminophen (TYLENOL) 500 MG tablet Take 500-1,000 mg by mouth every 6 (six) hours as needed for moderate pain.     cetirizine  (ZYRTEC) 10 MG tablet Take 10 mg by mouth daily.     ciprofloxacin (CIPRO) 500 MG tablet Take 1 tablet by mouth every 12 (twelve) hours.     divalproex (DEPAKOTE) 250 MG DR tablet Take 1 tablet (250 mg total) by mouth 2 (two) times daily. 180 tablet 1   levETIRAcetam (KEPPRA) 1000 MG tablet Take 1 tablet (1,000 mg total) by mouth 2 (two) times daily. 180 tablet 1   Multiple Vitamins-Minerals (MULTIVITAMIN WITH MINERALS) tablet Take 2 tablets by mouth daily.     Multiple Vitamins-Minerals (OCUVITE EYE HEALTH FORMULA) CAPS Take 2 tablets by mouth daily.     ondansetron (ZOFRAN-ODT) 8 MG disintegrating tablet Take 8 mg by mouth every 8 (eight) hours as needed for nausea or vomiting.     sennosides-docusate sodium (SENOKOT-S) 8.6-50 MG tablet Take 2 tablets by mouth daily as needed for constipation.     Specialty Vitamins Products (COLLAGEN ULTRA PO) Take 4 tablets by mouth daily.     zolpidem (AMBIEN) 10 MG tablet Take 10 mg by mouth daily as needed for sleep.     No current facility-administered medications for this visit.    REVIEW OF SYSTEMS:   Constitutional: ( - ) fevers, ( - )  chills , ( - ) night sweats Eyes: ( - ) blurriness of vision, ( - ) double vision, ( - ) watery eyes Ears, nose, mouth, throat, and face: ( - ) mucositis, ( - ) sore throat Respiratory: ( - ) cough, ( - ) dyspnea, ( - ) wheezes Cardiovascular: ( - ) palpitation, ( - ) chest discomfort, ( - ) lower extremity swelling Gastrointestinal:  ( - ) nausea, ( - ) heartburn, ( - ) change in bowel habits Skin: ( - ) abnormal skin rashes Lymphatics: ( - ) new lymphadenopathy, ( - ) easy bruising Neurological: ( - ) numbness, ( - ) tingling, ( - ) new weaknesses Behavioral/Psych: ( - ) mood change, ( - ) new changes  All other systems were reviewed with the patient and are negative.  PHYSICAL EXAMINATION: ECOG PERFORMANCE STATUS: {CHL ONC ECOG [id number]}  There were no vitals filed for this visit. There were no vitals  filed for this visit.  GENERAL: well appearing *** in NAD  SKIN: skin color, texture, turgor are normal, no rashes or significant lesions EYES: conjunctiva are pink and non-injected, sclera clear OROPHARYNX: no exudate, no erythema; lips, buccal mucosa, and tongue normal  NECK: supple, non-tender LYMPH:  no palpable lymphadenopathy in the cervical, axillary or supraclavicular lymph nodes.  LUNGS: clear to auscultation and percussion with normal breathing effort HEART: regular rate & rhythm and no murmurs and no lower extremity edema ABDOMEN: soft, non-tender, non-distended, normal bowel sounds Musculoskeletal: no cyanosis of digits and no clubbing  PSYCH: alert & oriented x 3, fluent speech NEURO: no focal motor/sensory deficits  LABORATORY DATA:  I have reviewed the  data as listed CBC Latest Ref Rng & Units 02/11/2021 03/09/2020 07/05/2018  WBC 4.0 - 10.5 K/uL 4.2 6.0 7.4  Hemoglobin 12.0 - 15.0 g/dL 11.2(L) 9.9(L) 10.8(L)  Hematocrit 36.0 - 46.0 % 35.4(L) 31.8(L) 33.9(L)  Platelets 150 - 400 K/uL 151 192 227    CMP Latest Ref Rng & Units 02/11/2021 04/01/2020 03/09/2020  Glucose 70 - 99 mg/dL 106(H) 96 87  BUN 6 - 20 mg/dL 22(H) 11 13  Creatinine 0.44 - 1.00 mg/dL 0.81 0.80 0.85  Sodium 135 - 145 mmol/L 139 139 138  Potassium 3.5 - 5.1 mmol/L 3.9 4.0 3.9  Chloride 98 - 111 mmol/L 105 103 103  CO2 22 - 32 mmol/L 28 28 27   Calcium 8.9 - 10.3 mg/dL 8.9 8.3(L) 8.4(L)  Total Protein 6.5 - 8.1 g/dL 6.6 5.7(L) 6.2(L)  Total Bilirubin 0.3 - 1.2 mg/dL 0.3 0.3 0.4  Alkaline Phos 38 - 126 U/L 50 54 58  AST 15 - 41 U/L 17 17 15   ALT 0 - 44 U/L 16 16 11      PATHOLOGY: ***  BLOOD FILM: *** Review of the peripheral blood smear showed normal appearing white cells with neutrophils that were appropriately lobated and granulated. There was no predominance of bi-lobed or hyper-segmented neutrophils appreciated. No Dohle bodies were noted. There was no left shifting, immature forms or blasts  noted. Lymphocytes remain normal in size without any predominance of large granular lymphocytes. Red cells show no anisopoikilocytosis, macrocytes , microcytes or polychromasia. There were no schistocytes, target cells, echinocytes, acanthocytes, dacrocytes, or stomatocytes.There was no rouleaux formation, nucleated red cells, or intra-cellular inclusions noted. The platelets are normal in size, shape, and color without any clumping evident.  RADIOGRAPHIC STUDIES: I have personally reviewed the radiological images as listed and agreed with the findings in the report. No results found.  ASSESSMENT & PLAN ***  No orders of the defined types were placed in this encounter.   All questions were answered. The patient knows to call the clinic with any problems, questions or concerns.  I have spent a total of {CHL ONC TIME VISIT - [id number] minutes of face-to-face and non-face-to-face time, preparing to see the patient, obtaining and/or reviewing separately obtained history, performing a medically appropriate examination, counseling and educating the patient, ordering medications/tests/procedures, referring and communicating with other health care professionals, documenting clinical information in the electronic health record, independently interpreting results and communicating results to the patient, and care coordination.   Dede Query, PA-C Department of Hematology/Oncology [place] at [facility] Phone: [phone]

## 2021-10-05 ENCOUNTER — Inpatient Hospital Stay: Payer: BC Managed Care – PPO | Attending: Hematology and Oncology | Admitting: Physician Assistant

## 2021-10-05 ENCOUNTER — Inpatient Hospital Stay: Payer: BC Managed Care – PPO

## 2021-10-27 ENCOUNTER — Encounter: Payer: Self-pay | Admitting: Family Medicine

## 2021-10-28 ENCOUNTER — Encounter: Payer: Self-pay | Admitting: Family Medicine

## 2021-11-01 ENCOUNTER — Encounter: Payer: Self-pay | Admitting: Family Medicine

## 2022-02-01 NOTE — Patient Instructions (Addendum)
Below is our plan:  We will continue levetiracetam 1000mg  and divalproex 250mg  twice daily. I will order an EEG and MRI to assess for any concerns that could cause seizures.   Please make sure you are consistent with timing of seizure medication. I recommend annual visit with primary care provider (PCP) for complete physical and routine blood work. We will monitor vitamin D level. I recommend daily intake of vitamin D (400-800iu) and calcium (800-1000mg ) for bone health. Discuss Dexa screening with PCP.   According to [place] law, you can not drive unless you are seizure / syncope free for at least 6 months and under physician's care.  Please maintain precautions. Do not participate in activities where a loss of awareness could harm you or someone else. No swimming alone, no tub bathing, no hot tubs, no driving, no operating motorized vehicles (cars, ATVs, motocycles, etc), lawnmowers, power tools or firearms. No standing at heights, such as rooftops, ladders or stairs. Avoid hot objects such as stoves, heaters, open fires. Wear a helmet when riding a bicycle, scooter, skateboard, etc. and avoid areas of traffic. Set your water heater to 120 degrees or less.   Please make sure you are staying well hydrated. I recommend 50-60 ounces daily. Well balanced diet and regular exercise encouraged. Consistent sleep schedule with 6-8 hours recommended.   Please continue follow up with care team as directed.   Follow up with me pending seizure workup  You may receive a survey regarding today's visit. I encourage you to leave honest feed back as I do use this information to improve patient care. Thank you for seeing me today!
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
## 2022-02-03 ENCOUNTER — Ambulatory Visit (INDEPENDENT_AMBULATORY_CARE_PROVIDER_SITE_OTHER): Payer: BC Managed Care – PPO | Admitting: Family Medicine

## 2022-02-03 ENCOUNTER — Encounter: Payer: Self-pay | Admitting: Family Medicine

## 2022-02-03 VITALS — BP 117/78 | HR 105 | Ht 64.0 in | Wt 155.0 lb

## 2022-02-03 DIAGNOSIS — G40909 Epilepsy, unspecified, not intractable, without status epilepticus: Secondary | ICD-10-CM

## 2022-02-03 DIAGNOSIS — R413 Other amnesia: Secondary | ICD-10-CM

## 2022-02-04 LAB — COMPREHENSIVE METABOLIC PANEL
ALT: 9 IU/L (ref 0–32)
AST: 12 IU/L (ref 0–40)
Albumin/Globulin Ratio: 1.5 (ref 1.2–2.2)
Albumin: 3.8 g/dL (ref 3.8–4.8)
Alkaline Phosphatase: 83 IU/L (ref 44–121)
BUN/Creatinine Ratio: 12 (ref 9–23)
BUN: 8 mg/dL (ref 6–24)
Bilirubin Total: <0.2 mg/dL (ref 0.0–1.2)
CO2: 25 mmol/L (ref 20–29)
Calcium: 9.1 mg/dL (ref 8.7–10.2)
Chloride: 103 mmol/L (ref 96–106)
Creatinine, Ser: 0.69 mg/dL (ref 0.57–1.00)
Globulin, Total: 2.6 g/dL (ref 1.5–4.5)
Glucose: 105 mg/dL — ABNORMAL HIGH (ref 70–99)
Potassium: 3.8 mmol/L (ref 3.5–5.2)
Sodium: 141 mmol/L (ref 134–144)
Total Protein: 6.4 g/dL (ref 6.0–8.5)
eGFR: 107 mL/min/{1.73_m2} (ref >59)

## 2022-02-04 LAB — CBC WITH DIFFERENTIAL/PLATELET
Basophils Absolute: 0 10*3/uL (ref 0.0–0.2)
Basos: 1 %
EOS (ABSOLUTE): 0.1 10*3/uL (ref 0.0–0.4)
Eos: 3 %
Hematocrit: 31.5 % — ABNORMAL LOW (ref 34.0–46.6)
Hemoglobin: 9.8 g/dL — ABNORMAL LOW (ref 11.1–15.9)
Immature Grans (Abs): 0 10*3/uL (ref 0.0–0.1)
Immature Granulocytes: 0 %
Lymphocytes Absolute: 1.3 10*3/uL (ref 0.7–3.1)
Lymphs: 39 %
MCH: 26.7 pg (ref 26.6–33.0)
MCHC: 31.1 g/dL — ABNORMAL LOW (ref 31.5–35.7)
MCV: 86 fL (ref 79–97)
Monocytes Absolute: 0.3 10*3/uL (ref 0.1–0.9)
Monocytes: 10 %
Neutrophils Absolute: 1.6 10*3/uL (ref 1.4–7.0)
Neutrophils: 47 %
Platelets: 210 10*3/uL (ref 150–450)
RBC: 3.67 x10E6/uL — ABNORMAL LOW (ref 3.77–5.28)
RDW: 14.2 % (ref 11.7–15.4)
WBC: 3.4 10*3/uL (ref 3.4–10.8)

## 2022-02-04 LAB — VALPROIC ACID LEVEL: Valproic Acid Lvl: 43 ug/mL — ABNORMAL LOW (ref 50–100)

## 2022-02-04 LAB — LEVETIRACETAM LEVEL: Levetiracetam Lvl: 41.2 ug/mL — ABNORMAL HIGH (ref 10.0–40.0)

## 2022-02-09 ENCOUNTER — Telehealth: Payer: Self-pay | Admitting: Family Medicine

## 2022-02-09 NOTE — Telephone Encounter (Signed)
[facility]patient has [facility] faxed order they will reach out to the patient to schedule

## 2022-02-10 ENCOUNTER — Ambulatory Visit (INDEPENDENT_AMBULATORY_CARE_PROVIDER_SITE_OTHER): Payer: BC Managed Care – PPO | Admitting: Diagnostic Neuroimaging

## 2022-02-10 ENCOUNTER — Encounter: Payer: Self-pay | Admitting: Family Medicine

## 2022-02-10 DIAGNOSIS — G40909 Epilepsy, unspecified, not intractable, without status epilepticus: Secondary | ICD-10-CM | POA: Diagnosis not present

## 2022-02-16 ENCOUNTER — Encounter: Payer: Self-pay | Admitting: Family Medicine

## 2022-02-16 ENCOUNTER — Other Ambulatory Visit: Payer: Self-pay | Admitting: *Deleted

## 2022-02-16 MED ORDER — DIVALPROEX SODIUM 250 MG PO DR TAB: 250.0000 mg | DELAYED_RELEASE_TABLET | Freq: Two times a day (BID) | ORAL | 1 refills | Status: DC

## 2022-02-16 MED ORDER — LEVETIRACETAM 1000 MG PO TABS: 1000.0000 mg | ORAL_TABLET | Freq: Two times a day (BID) | ORAL | 1 refills | Status: DC

## 2022-02-18 ENCOUNTER — Telehealth: Payer: Self-pay

## 2022-02-21 NOTE — Telephone Encounter (Signed)
[facility] left a voice asking me to fax orders, so I sent them for the second time today.

## 2022-02-23 NOTE — Procedures (Signed)
   [facility]  EEG (ELECTROENCEPHALOGRAM) REPORT   STUDY DATE: 02/10/22 PATIENT NAME: [name] DOB: [birth year] MRN: [id number]  ORDERING CLINICIAN: Joycelyn Schmid, MD   TECHNOLOGIST: Marcheta Grammes TECHNIQUE: Electroencephalogram was recorded utilizing standard 10-20 system of lead placement and reformatted into average and bipolar montages.  RECORDING TIME: 23 minutes ACTIVATION: hyperventilation and photic stimulation  CLINICAL INFORMATION: 46 year old female with seizure disorder.  FINDINGS: Posterior dominant background rhythms, which attenuate with eye opening, ranging 11-12 hertz and 70-80 microvolts. No focal, lateralizing, epileptiform activity or seizures are seen. Patient recorded in the awake and drowsy state. EKG channel shows regular rhythm of 70-80 beats per minute.   IMPRESSION:   Normal EEG in the awake and drowsy states.    INTERPRETING PHYSICIAN:  Suanne Marker, MD Certified in Neurology, Neurophysiology and Neuroimaging  [facility] [address] [place], [place] [address] [phone]

## 2022-03-08 ENCOUNTER — Encounter: Payer: Self-pay | Admitting: Family Medicine

## 2022-03-22 ENCOUNTER — Telehealth: Payer: Self-pay

## 2022-03-22 NOTE — Telephone Encounter (Signed)
Forms given to POD 1, Amy pt

## 2022-03-22 NOTE — Telephone Encounter (Signed)
Received FMLA paperwork, $50 form fee paid. Bringing to POD 3

## 2022-03-23 DIAGNOSIS — Z0289 Encounter for other administrative examinations: Secondary | ICD-10-CM

## 2022-03-31 DIAGNOSIS — Z0289 Encounter for other administrative examinations: Secondary | ICD-10-CM

## 2022-03-31 NOTE — Telephone Encounter (Signed)
Paperwork has been completed and gave to medical records to fax and process

## 2022-04-06 ENCOUNTER — Telehealth: Payer: Self-pay | Admitting: Neurology

## 2022-04-06 ENCOUNTER — Telehealth: Payer: Self-pay | Admitting: Hematology and Oncology

## 2022-04-06 NOTE — Telephone Encounter (Signed)
Received DMV forms and have completed them. Amy will review and once signed we will turn into medical records.

## 2022-04-06 NOTE — Telephone Encounter (Signed)
Pt called in to r/s her new hem appt from February. I r/s appt. She is aware of new appt date/time.

## 2022-04-27 ENCOUNTER — Other Ambulatory Visit: Payer: Self-pay

## 2022-04-27 ENCOUNTER — Inpatient Hospital Stay: Payer: BC Managed Care – PPO

## 2022-04-27 ENCOUNTER — Encounter: Payer: Self-pay | Admitting: *Deleted

## 2022-04-27 ENCOUNTER — Inpatient Hospital Stay: Payer: BC Managed Care – PPO | Attending: Hematology and Oncology | Admitting: Hematology and Oncology

## 2022-04-27 VITALS — BP 130/90 | HR 93 | Temp 98.1°F | Resp 18 | Ht 64.0 in | Wt 155.8 lb

## 2022-04-27 DIAGNOSIS — I10 Essential (primary) hypertension: Secondary | ICD-10-CM | POA: Diagnosis not present

## 2022-04-27 DIAGNOSIS — R5383 Other fatigue: Secondary | ICD-10-CM | POA: Diagnosis not present

## 2022-04-27 DIAGNOSIS — K219 Gastro-esophageal reflux disease without esophagitis: Secondary | ICD-10-CM | POA: Diagnosis not present

## 2022-04-27 DIAGNOSIS — D508 Other iron deficiency anemias: Secondary | ICD-10-CM | POA: Diagnosis present

## 2022-04-27 DIAGNOSIS — Z79899 Other long term (current) drug therapy: Secondary | ICD-10-CM | POA: Diagnosis not present

## 2022-04-27 DIAGNOSIS — E785 Hyperlipidemia, unspecified: Secondary | ICD-10-CM | POA: Insufficient documentation

## 2022-04-27 DIAGNOSIS — Z803 Family history of malignant neoplasm of breast: Secondary | ICD-10-CM | POA: Insufficient documentation

## 2022-04-27 DIAGNOSIS — M1990 Unspecified osteoarthritis, unspecified site: Secondary | ICD-10-CM | POA: Insufficient documentation

## 2022-04-27 DIAGNOSIS — D509 Iron deficiency anemia, unspecified: Secondary | ICD-10-CM | POA: Insufficient documentation

## 2022-04-27 DIAGNOSIS — Z9884 Bariatric surgery status: Secondary | ICD-10-CM | POA: Insufficient documentation

## 2022-04-27 DIAGNOSIS — R569 Unspecified convulsions: Secondary | ICD-10-CM | POA: Diagnosis not present

## 2022-04-27 LAB — CBC WITH DIFFERENTIAL (CANCER CENTER ONLY)
Abs Immature Granulocytes: 0.02 10*3/uL (ref 0.00–0.07)
Basophils Absolute: 0 10*3/uL (ref 0.0–0.1)
Basophils Relative: 1 %
Eosinophils Absolute: 0.1 10*3/uL (ref 0.0–0.5)
Eosinophils Relative: 3 %
HCT: 29.1 % — ABNORMAL LOW (ref 36.0–46.0)
Hemoglobin: 9.3 g/dL — ABNORMAL LOW (ref 12.0–15.0)
Immature Granulocytes: 1 %
Lymphocytes Relative: 40 %
Lymphs Abs: 1.6 10*3/uL (ref 0.7–4.0)
MCH: 26.9 pg (ref 26.0–34.0)
MCHC: 32 g/dL (ref 30.0–36.0)
MCV: 84.1 fL (ref 80.0–100.0)
Monocytes Absolute: 0.4 10*3/uL (ref 0.1–1.0)
Monocytes Relative: 9 %
Neutro Abs: 1.8 10*3/uL (ref 1.7–7.7)
Neutrophils Relative %: 46 %
Platelet Count: 161 10*3/uL (ref 150–400)
RBC: 3.46 MIL/uL — ABNORMAL LOW (ref 3.87–5.11)
RDW: 15.3 % (ref 11.5–15.5)
Smear Review: NORMAL
WBC Count: 4 10*3/uL (ref 4.0–10.5)
nRBC: 0 % (ref 0.0–0.2)

## 2022-04-27 LAB — CMP (CANCER CENTER ONLY)
ALT: 9 U/L (ref 0–44)
AST: 11 U/L — ABNORMAL LOW (ref 15–41)
Albumin: 3.6 g/dL (ref 3.5–5.0)
Alkaline Phosphatase: 58 U/L (ref 38–126)
Anion gap: 5 (ref 5–15)
BUN: 8 mg/dL (ref 6–20)
CO2: 31 mmol/L (ref 22–32)
Calcium: 8.6 mg/dL — ABNORMAL LOW (ref 8.9–10.3)
Chloride: 107 mmol/L (ref 98–111)
Creatinine: 0.72 mg/dL (ref 0.44–1.00)
GFR, Estimated: >60 mL/min (ref >60)
Glucose, Bld: 89 mg/dL (ref 70–99)
Potassium: 3.6 mmol/L (ref 3.5–5.1)
Sodium: 143 mmol/L (ref 135–145)
Total Bilirubin: 0.2 mg/dL — ABNORMAL LOW (ref 0.3–1.2)
Total Protein: 6 g/dL — ABNORMAL LOW (ref 6.5–8.1)

## 2022-04-27 LAB — VITAMIN B12: Vitamin B-12: 322 pg/mL (ref 180–914)

## 2022-04-27 LAB — RETIC PANEL
Immature Retic Fract: 16.8 % — ABNORMAL HIGH (ref 2.3–15.9)
RBC.: 3.42 MIL/uL — ABNORMAL LOW (ref 3.87–5.11)
Retic Count, Absolute: 48.2 10*3/uL (ref 19.0–186.0)
Retic Ct Pct: 1.4 % (ref 0.4–3.1)
Reticulocyte Hemoglobin: 26.4 pg — ABNORMAL LOW (ref >27.9)

## 2022-04-27 LAB — FOLATE: Folate: 11.1 ng/mL (ref >5.9)

## 2022-04-27 LAB — IRON AND IRON BINDING CAPACITY (CC-WL,HP ONLY)
Iron: 16 ug/dL — ABNORMAL LOW (ref 28–170)
Saturation Ratios: 5 % — ABNORMAL LOW (ref 10.4–31.8)
TIBC: 358 ug/dL (ref 250–450)
UIBC: 342 ug/dL (ref 148–442)

## 2022-04-27 LAB — FERRITIN: Ferritin: 2 ng/mL — ABNORMAL LOW (ref 11–307)

## 2022-04-27 NOTE — Progress Notes (Signed)
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 female with medical history significant for Roux-en-Y gastric bypass, hyperlipidemia, hypertension, Dr. Ramiro Harvest, and GERD who presents for evaluation of iron deficiency anemia.  On review of the previous records Mrs. Guse has had iron deficiency anemia since at least 07/05/2018.  At that time she had hemoglobin of 10.8.  Dropped to 9.9 on 03/09/2020.  Most recently on 02/03/2022 patient was found to have hemoglobin 9.8, MCV 86, platelets of 210, and white blood cell count 3.4.  Due to concern for this anemia the patient was referred to hematology for further evaluation management.  On exam today Mrs. Arvanitis notes that she is struggled with iron deficiency anemia for quite some time.  She reports that when her daughter was to she had heavy bleeding status postdelivery and now her daughter is 97 years old.  She has been going on for at least 10 years time.  When she tries to donate blood she has been turned away.  She does that she is "always tired and always low energy".  She notes that she has been prescribed iron pills on multiple occasions but "only does make you constipated".  She reports that she does have blood in her stool on occasion and recently had some blood in her urine during a urinary tract infection.  She  does have a history of seizures and her last seizure was over 1 year ago.  On further discussion she notes her family history is markable for breast cancer in her mother and maternal grandmother.  Her father has gout and her paternal grandmother has Alzheimer's disease.  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  She also has a degree in nursing.  She notes she is not having any fevers, chills, sweats, nausea, vomiting or diarrhea.  She is prone to bouts of fatigue and that sometimes she can barely walk down the stairs.  Full 10 point ROS is listed below.  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   Hyperlipidemia    Hypertension    resolved 5 yrs ago   Incontinence    Lumbago    Plantar fasciitis    Seizures (HCC)    seizure 11/23/15 and mild seizure 02/08/16   Sinus problem    Sore throat    Wears glasses    Wears glasses    reading glasses only    SURGICAL HISTORY: Past Surgical History:  Procedure Laterality Date   CARPAL TUNNEL RELEASE Right 02/02/2011  CARPAL TUNNEL RELEASE Left 01/22/2014   Procedure: LEFT CARPAL TUNNEL RELEASE;  Surgeon: Nicki Reaper, MD;  Location: [facility];  Service: Orthopedics;  Laterality: Left;   CHOLECYSTECTOMY  08/06/2004   ESOPHAGOGASTRODUODENOSCOPY  03/14/2011   GASTRIC BYPASS  03/14/11   lap. Roux-en-Y   HYSTEROSCOPY WITH NOVASURE  10/15/2004   PLANTAR FASCIA RELEASE Left 08/25/2009   PLANTAR FASCIA RELEASE Right 01/02/2004   TRIGGER FINGER RELEASE Right 06/24/2014   Procedure: RELEASE A-1 PULLEY RIGHT RING FINGER;  Surgeon: Cindee Salt, MD;  Location: [facility];  Service: Orthopedics;  Laterality:  Right;  ANESTHESIA: IV REGIONAL FAB   TUBAL LIGATION  1999    SOCIAL HISTORY: Social History   Socioeconomic History   Marital status: Married    Spouse name: Arlys Shanetha Bradham   Number of children: 2   Years of education: 14   Highest education level: Not on file  Occupational History   Not on file  Tobacco Use   Smoking status: Never   Smokeless tobacco: Never  Vaping Use   Vaping Use: Never used  Substance and Sexual Activity   Alcohol use: No   Drug use: No   Sexual activity: Never  Other Topics Concern   Not on file  Social History Narrative   Lives at home with husband   Caffeine use- sodas, 3 20 oz bottles daily   Social Determinants of Health   Financial Resource Strain: Not on file  Food Insecurity: Not on file  Transportation Needs: Not on file  Physical Activity: Not on file  Stress: Not on file  Social Connections: Not on file  Intimate Partner Violence: Not on file    FAMILY HISTORY: Family History  Problem Relation Age of Onset   Gout Father    Cancer Maternal Grandmother        breast   Breast cancer Maternal Grandmother    Breast cancer Mother    Stroke Paternal Grandmother    Dementia Paternal Grandmother     ALLERGIES:  is allergic to cephalexin, cephalosporins, codeine, morphine and related, other, pentazocine lactate, prednisone, rocephin [ceftriaxone sodium in dextrose], tegretol [carbamazepine], and tramadol.  MEDICATIONS:  Current Outpatient Medications  Medication Sig Dispense Refill   acetaminophen (TYLENOL) 500 MG tablet Take 500-1,000 mg by mouth every 6 (six) hours as needed for moderate pain.     cetirizine (ZYRTEC) 10 MG tablet Take 10 mg by mouth daily.     diphenhydrAMINE (BENADRYL) 25 mg capsule Take 1 capsule by mouth daily. Alternates w/ Zyrtec     divalproex (DEPAKOTE) 250 MG DR tablet Take 1 tablet (250 mg total) by mouth 2 (two) times daily. 180 tablet 1   levETIRAcetam (KEPPRA) 1000 MG tablet Take 1 tablet (1,000 mg total) by  mouth 2 (two) times daily. 180 tablet 1   ondansetron (ZOFRAN-ODT) 8 MG disintegrating tablet Take 8 mg by mouth every 8 (eight) hours as needed for nausea or vomiting.     sennosides-docusate sodium (SENOKOT-S) 8.6-50 MG tablet Take 2 tablets by mouth daily as needed for constipation.     zolpidem (AMBIEN) 10 MG tablet Take 10 mg by mouth daily as needed for sleep.     No current facility-administered medications for this visit.    REVIEW OF SYSTEMS:   Constitutional: ( - ) fevers, ( - )  chills , ( - ) night sweats Eyes: ( - ) blurriness of vision, ( - ) double vision, ( - ) watery eyes Ears, nose,  mouth, throat, and face: ( - ) mucositis, ( - ) sore throat Respiratory: ( - ) cough, ( - ) dyspnea, ( - ) wheezes Cardiovascular: ( - ) palpitation, ( - ) chest discomfort, ( - ) lower extremity swelling Gastrointestinal:  ( - ) nausea, ( - ) heartburn, ( - ) change in bowel habits Skin: ( - ) abnormal skin rashes Lymphatics: ( - ) new lymphadenopathy, ( - ) easy bruising Neurological: ( - ) numbness, ( - ) tingling, ( - ) new weaknesses Behavioral/Psych: ( - ) mood change, ( - ) new changes  All other systems were reviewed with the patient and are negative.  PHYSICAL EXAMINATION:  Vitals:   04/27/22 0928  BP: (!) 130/90  Pulse: 93  Resp: 18  Temp: 98.1 F (36.7 C)  SpO2: 100%   Filed Weights   04/27/22 0928  Weight: 155 lb 12.8 oz (70.7 kg)    GENERAL: Pale appearing middle-aged Caucasian female in NAD  SKIN: skin color, texture, turgor are normal, no rashes or significant lesions EYES: conjunctiva are pink and non-injected, sclera clear LUNGS: clear to auscultation and percussion with normal breathing effort HEART: regular rate & rhythm and no murmurs and no lower extremity edema Musculoskeletal: no cyanosis of digits and no clubbing  PSYCH: alert & oriented x 3, fluent speech NEURO: no focal motor/sensory deficits  LABORATORY DATA:  I have reviewed the data as  listed    Latest Ref Rng & Units 02/03/2022   11:08 AM 02/11/2021   11:26 AM 03/09/2020   11:53 AM  CBC  WBC 3.4 - 10.8 x10E3/uL 3.4  4.2  6.0   Hemoglobin 11.1 - 15.9 g/dL 9.8  36.6  9.9   Hematocrit 34.0 - 46.6 % 31.5  35.4  31.8   Platelets 150 - 450 x10E3/uL 210  151  192        Latest Ref Rng & Units 02/03/2022   11:08 AM 02/11/2021   11:26 AM 04/01/2020    8:17 PM  CMP  Glucose 70 - 99 mg/dL 294  765  96   BUN 6 - 24 mg/dL 8  22  11    Creatinine 0.57 - 1.00 mg/dL  4.65  0.35   Sodium 134 - 144 mmol/L 141  139  139   Potassium 3.5 - 5.2 mmol/L 3.8  3.9  4.0   Chloride 96 - 106 mmol/L 103  105  103   CO2 20 - 29 mmol/L 25  28  28    Calcium 8.7 - 10.2 mg/dL 9.1  8.9  8.3   Total Protein 6.0 - 8.5 g/dL 6.4  6.6  5.7   Total Bilirubin 0.0 - 1.2 mg/dL 4.65  0.3  0.3   Alkaline Phos 44 - 121 IU/L 83  50  54   AST 0 - 40 IU/L 12  17  17    ALT 0 - 32 IU/L 9  16  16       ASSESSMENT & PLAN Cadence D Gasper 46 y.o. female with medical history significant for Roux-en-Y gastric bypass, hyperlipidemia, hypertension, Dr. <6.8, and GERD who presents for evaluation of iron deficiency anemia.  After review of the labs, review of the records, and discussion with the patient the patients findings are most consistent with iron deficiency anemia secondary to gastric bypass.  At this time would recommend repletion of iron stores with IV iron therapy.  P.o. therapy is generally adequate gastric bypass patients as they do not appropriately  absorb p.o. iron therapy.  Additionally they did not absorb iron from food as they should.  As such I would recommend we proceed with IV iron treatment.  Additionally we will check her for vitamin B12 deficiency which can also occur in patients with gastric bypass.  The patient and her husband voiced understanding of the plan moving forward.  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 --P.o. iron therapy will likely be ineffective in a patient with gastric bypass.  We will need to proceed with IV iron therapy. --We will plan to proceed with IV iron therapy in order to help bolster the patient's blood counts --Plan for return to clinic in 4 to 6 weeks time after last dose of IV iron   Orders Placed This Encounter  Procedures   CBC with Differential (Cancer Center Only)    Standing Status:   Future    Number of Occurrences:   1    Standing Expiration Date:   04/28/2023   CMP (Cancer Center only)    Standing Status:   Future    Number of Occurrences:   1    Standing Expiration Date:   04/28/2023   Ferritin    Standing Status:   Future    Number of Occurrences:   1    Standing Expiration Date:   04/28/2023   Iron and Iron Binding Capacity ([facility] only)    Standing Status:   Future    Number of Occurrences:   1    Standing Expiration Date:   04/28/2023   Retic Panel    Standing Status:   Future    Number of Occurrences:   1    Standing Expiration Date:   04/28/2023   Vitamin B12    Standing Status:   Future    Number of Occurrences:   1    Standing Expiration Date:   04/27/2023   Methylmalonic acid, serum    Standing Status:   Future    Number of Occurrences:   1    Standing Expiration Date:   04/27/2023   Folate, Serum    Standing Status:   Future    Number of Occurrences:   1    Standing Expiration Date:   04/27/2023    All questions were answered. The patient knows to call the clinic with any problems, questions or concerns.  A total of more than 60 minutes were spent on this encounter with face-to-face time and non-face-to-face time, including preparing to see the patient, ordering tests and/or medications,  counseling the patient and coordination of care as outlined above.   Ulysees Barns, MD Department of Hematology/Oncology [facility] at [facility] Phone: [phone] Pager: [phone] Email: [email or web address]  04/27/2022 10:21 AM

## 2022-05-02 LAB — METHYLMALONIC ACID, SERUM: Methylmalonic Acid, Quantitative: 138 nmol/L (ref 0–378)

## 2022-05-03 ENCOUNTER — Telehealth: Payer: Self-pay | Admitting: Pharmacy Technician

## 2022-05-03 ENCOUNTER — Other Ambulatory Visit: Payer: Self-pay | Admitting: Hematology and Oncology

## 2022-05-03 NOTE — Telephone Encounter (Addendum)
Dr. Leonides Schanz, [place] Childes note:  Auth Submission: NO AUTH NEEDED No predeterimination needed - ref# [id number] 05/03/22 @ 3:14 Payer: [employer] [place] Medication & CPT/J Code(s) submitted: Monoferric (Ferrci derisomaltose) [phone] Route of submission (phone, fax, portal):  Phone # [phone] Fax # Auth type: Buy/Bill Units/visits requested: X1 DOSE Reference number: [id number] Chelsy-V 05/03/22 @ 2:56 Approval from: 05/03/22 to 08/28/22   Monoferric co-pay card; approved Id: [id number] Bin: [id number] Pcn: [id number] Gr: [id number]

## 2022-05-09 ENCOUNTER — Ambulatory Visit (INDEPENDENT_AMBULATORY_CARE_PROVIDER_SITE_OTHER): Payer: BC Managed Care – PPO

## 2022-05-09 VITALS — BP 110/76 | HR 94 | Temp 98.0°F | Resp 18 | Ht 64.0 in | Wt 149.4 lb

## 2022-05-09 DIAGNOSIS — D508 Other iron deficiency anemias: Secondary | ICD-10-CM | POA: Diagnosis not present

## 2022-05-09 MED ORDER — SODIUM CHLORIDE 0.9 % IV SOLN
1000.0000 mg | Freq: Once | INTRAVENOUS | Status: AC
  Administered 2022-05-09: 1000 mg via INTRAVENOUS
  Filled 2022-05-09: qty 10

## 2022-05-09 NOTE — Patient Instructions (Signed)

## 2022-05-09 NOTE — Progress Notes (Signed)
Diagnosis: Iron Deficiency Anemia  Provider:  Chilton Greathouse MD  Procedure: Infusion  IV Type: Peripheral, IV Location: L Antecubital  Monoferric (Ferric Derisomaltose), Dose: 1000 mg  Infusion Start Time: 0920  Infusion Stop Time: 0942  Post Infusion IV Care: Observation period completed and Peripheral IV Discontinued  Discharge: Condition: Good, Destination: Home . AVS provided to patient.   Performed by:  Adriana Mccallum, RN

## 2022-05-10 ENCOUNTER — Telehealth: Payer: Self-pay

## 2022-05-10 NOTE — Telephone Encounter (Signed)
Spoke with pt and she is experiencing a decreased appetite, headache, heart pounding, fluctuation in B/P and "internal tingling." She had first Monoferic infusion yesterday and wanted to know if this was a reaction. Pt had gastric bypass surgery and is unable to drink very much. Encouraged her to drink as much as possible and to take Tylenol for her headache. Spoke with Dr. Leonides Schanz and he advised that this could be the start of an infection but most likely not related to iron infusion.  Called pt back and relayed information. Patient to continue to monitor symptoms and to call clinic with further concerns.

## 2022-06-22 ENCOUNTER — Inpatient Hospital Stay: Payer: BC Managed Care – PPO | Attending: Hematology and Oncology

## 2022-06-22 ENCOUNTER — Other Ambulatory Visit: Payer: Self-pay | Admitting: Hematology and Oncology

## 2022-06-22 ENCOUNTER — Inpatient Hospital Stay (HOSPITAL_BASED_OUTPATIENT_CLINIC_OR_DEPARTMENT_OTHER): Payer: BC Managed Care – PPO | Admitting: Hematology and Oncology

## 2022-06-22 VITALS — BP 119/85 | HR 107 | Temp 98.4°F | Resp 17 | Wt 152.2 lb

## 2022-06-22 DIAGNOSIS — D508 Other iron deficiency anemias: Secondary | ICD-10-CM

## 2022-06-22 DIAGNOSIS — Z9884 Bariatric surgery status: Secondary | ICD-10-CM | POA: Insufficient documentation

## 2022-06-22 LAB — CBC WITH DIFFERENTIAL (CANCER CENTER ONLY)
Abs Immature Granulocytes: 0.02 10*3/uL (ref 0.00–0.07)
Basophils Absolute: 0 10*3/uL (ref 0.0–0.1)
Basophils Relative: 1 %
Eosinophils Absolute: 0.2 10*3/uL (ref 0.0–0.5)
Eosinophils Relative: 3 %
HCT: 38 % (ref 36.0–46.0)
Hemoglobin: 12.5 g/dL (ref 12.0–15.0)
Immature Granulocytes: 0 %
Lymphocytes Relative: 34 %
Lymphs Abs: 1.6 10*3/uL (ref 0.7–4.0)
MCH: 29.4 pg (ref 26.0–34.0)
MCHC: 32.9 g/dL (ref 30.0–36.0)
MCV: 89.4 fL (ref 80.0–100.0)
Monocytes Absolute: 0.4 10*3/uL (ref 0.1–1.0)
Monocytes Relative: 8 %
Neutro Abs: 2.5 10*3/uL (ref 1.7–7.7)
Neutrophils Relative %: 54 %
Platelet Count: 192 10*3/uL (ref 150–400)
RBC: 4.25 MIL/uL (ref 3.87–5.11)
RDW: 16.6 % — ABNORMAL HIGH (ref 11.5–15.5)
WBC Count: 4.6 10*3/uL (ref 4.0–10.5)
nRBC: 0 % (ref 0.0–0.2)

## 2022-06-22 LAB — CMP (CANCER CENTER ONLY)
ALT: 10 U/L (ref 0–44)
AST: 14 U/L — ABNORMAL LOW (ref 15–41)
Albumin: 4.1 g/dL (ref 3.5–5.0)
Alkaline Phosphatase: 70 U/L (ref 38–126)
Anion gap: 7 (ref 5–15)
BUN: 7 mg/dL (ref 6–20)
CO2: 30 mmol/L (ref 22–32)
Calcium: 9.4 mg/dL (ref 8.9–10.3)
Chloride: 106 mmol/L (ref 98–111)
Creatinine: 0.89 mg/dL (ref 0.44–1.00)
GFR, Estimated: >60 mL/min (ref >60)
Glucose, Bld: 93 mg/dL (ref 70–99)
Potassium: 3.8 mmol/L (ref 3.5–5.1)
Sodium: 143 mmol/L (ref 135–145)
Total Bilirubin: 0.3 mg/dL (ref 0.3–1.2)
Total Protein: 6.8 g/dL (ref 6.5–8.1)

## 2022-06-22 LAB — RETIC PANEL
Immature Retic Fract: 6.3 % (ref 2.3–15.9)
RBC.: 4.23 MIL/uL (ref 3.87–5.11)
Retic Count, Absolute: 50.8 10*3/uL (ref 19.0–186.0)
Retic Ct Pct: 1.2 % (ref 0.4–3.1)
Reticulocyte Hemoglobin: 33.9 pg (ref >27.9)

## 2022-06-22 LAB — IRON AND IRON BINDING CAPACITY (CC-WL,HP ONLY)
Iron: 70 ug/dL (ref 28–170)
Saturation Ratios: 26 % (ref 10.4–31.8)
TIBC: 270 ug/dL (ref 250–450)
UIBC: 200 ug/dL (ref 148–442)

## 2022-06-22 LAB — FERRITIN: Ferritin: 83 ng/mL (ref 11–307)

## 2022-06-22 NOTE — Progress Notes (Signed)
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 Leonides Schanz  05/09/2022: 1000 mg IV monoferric administered 06/22/2022: WBC 4.6, Hgb 12.5, MCV 89.4, Plt 192  Interval History:  [name] 46 y.o. female with medical history significant for iron deficiency anemia 2/2 to gastric bypass who presents for a follow up visit. The patient's last visit was on 04/27/2022. In the interim since the last visit received her IV Monoferric infusion on 05/09/2022.  On exam today [name] is accompanied by her husband.  She reports that the first few days that she developed a headache and did not feel good after receiving the IV iron infusion.  Subsequently she had a boost in energy and felt much better.  That boost in energy last approximately 2 weeks.  Unfortunate energy is now declining.  She does have bouts of increased heart rate.  This week she is also developed some red spots on her arms.  She notes that she does have some occasional episodes of lightheadedness though she does feel like she can do considerably more.  Her energy is "not 100%" and she physical is reviewed 80% at this time.  She otherwise denies any fevers, chills, sweats, nausea, vomiting or diarrhea.  Full 10 point ROS was otherwise negative.  MEDICAL HISTORY:  Past Medical History:  Diagnosis Date   Arthritis    foot and knee   Bruises easily    GERD (gastroesophageal reflux disease)    resolved 5 yrs ago   Headache, migraine    Headaches, cluster    Hx of gastric  bypass may 2012   Roux-en-Y gastric bypass   Hyperlipidemia    Hypertension    resolved 5 yrs ago   Incontinence    Lumbago    Plantar fasciitis    Seizures (HCC)    seizure 11/23/15 and mild seizure 02/08/16   Sinus problem    Sore throat    Wears glasses    Wears glasses    reading glasses only    SURGICAL HISTORY: Past Surgical History:  Procedure Laterality Date   CARPAL TUNNEL RELEASE Right 02/02/2011   CARPAL TUNNEL RELEASE Left 01/22/2014   Procedure: LEFT CARPAL TUNNEL RELEASE;  Surgeon: Nicki Reaper, MD;  Location: [facility];  Service: Orthopedics;  Laterality: Left;   CHOLECYSTECTOMY  08/06/2004   ESOPHAGOGASTRODUODENOSCOPY  03/14/2011   GASTRIC BYPASS  03/14/11   lap. Roux-en-Y   HYSTEROSCOPY WITH NOVASURE  10/15/2004   PLANTAR FASCIA RELEASE Left 08/25/2009   PLANTAR FASCIA RELEASE Right 01/02/2004   TRIGGER FINGER RELEASE Right 06/24/2014   Procedure: RELEASE A-1 PULLEY RIGHT RING FINGER;  Surgeon: Cindee Salt, MD;  Location:  SURGERY CENTER;  Service: Orthopedics;  Laterality: Right;  ANESTHESIA: IV REGIONAL FAB   TUBAL LIGATION  1999    SOCIAL HISTORY: Social History   Socioeconomic History   Marital status: Married    Spouse name: [name]   Number of children: 2   Years of education: 14   Highest education level: Not on file  Occupational  History   Not on file  Tobacco Use   Smoking status: Never   Smokeless tobacco: Never  Vaping Use   Vaping Use: Never used  Substance and Sexual Activity   Alcohol use: No   Drug use: No   Sexual activity: Never  Other Topics Concern   Not on file  Social History Narrative   Lives at home with husband   Caffeine use- sodas, 3 20 oz bottles daily   Social Determinants of Health   Financial Resource Strain: Not on file  Food Insecurity: Not on file  Transportation Needs: Not on file  Physical Activity: Not on file  Stress: Not on file  Social Connections: Not on file  Intimate Partner  Violence: Not on file    FAMILY HISTORY: Family History  Problem Relation Age of Onset   Gout Father    Cancer Maternal Grandmother        breast   Breast cancer Maternal Grandmother    Breast cancer Mother    Stroke Paternal Grandmother    Dementia Paternal Grandmother     ALLERGIES:  is allergic to cephalexin, cephalosporins, codeine, morphine and related, other, pentazocine lactate, prednisone, rocephin [ceftriaxone sodium in dextrose], tegretol [carbamazepine], and tramadol.  MEDICATIONS:  Current Outpatient Medications  Medication Sig Dispense Refill   acetaminophen (TYLENOL) 500 MG tablet Take 500-1,000 mg by mouth every 6 (six) hours as needed for moderate pain.     cetirizine (ZYRTEC) 10 MG tablet Take 10 mg by mouth daily.     diphenhydrAMINE (BENADRYL) 25 mg capsule Take 1 capsule by mouth daily. Alternates w/ Zyrtec     divalproex (DEPAKOTE) 250 MG DR tablet Take 1 tablet (250 mg total) by mouth 2 (two) times daily. 180 tablet 1   levETIRAcetam (KEPPRA) 1000 MG tablet Take 1 tablet (1,000 mg total) by mouth 2 (two) times daily. 180 tablet 1   ondansetron (ZOFRAN-ODT) 8 MG disintegrating tablet Take 8 mg by mouth every 8 (eight) hours as needed for nausea or vomiting.     sennosides-docusate sodium (SENOKOT-S) 8.6-50 MG tablet Take 2 tablets by mouth daily as needed for constipation.     zolpidem (AMBIEN) 10 MG tablet Take 10 mg by mouth daily as needed for sleep.     No current facility-administered medications for this visit.    REVIEW OF SYSTEMS:   Constitutional: ( - ) fevers, ( - )  chills , ( - ) night sweats Eyes: ( - ) blurriness of vision, ( - ) double vision, ( - ) watery eyes Ears, nose, mouth, throat, and face: ( - ) mucositis, ( - ) sore throat Respiratory: ( - ) cough, ( - ) dyspnea, ( - ) wheezes Cardiovascular: ( - ) palpitation, ( - ) chest discomfort, ( - ) lower extremity swelling Gastrointestinal:  ( - ) nausea, ( - ) heartburn, ( - ) change in  bowel habits Skin: ( - ) abnormal skin rashes Lymphatics: ( - ) new lymphadenopathy, ( - ) easy bruising Neurological: ( - ) numbness, ( - ) tingling, ( - ) new weaknesses Behavioral/Psych: ( - ) mood change, ( - ) new changes  All other systems were reviewed with the patient and are negative.  PHYSICAL EXAMINATION:  Vitals:   06/22/22 1111  BP: 119/85  Pulse: (!) 107  Resp: 17  Temp: 98.4 F (36.9 C)  SpO2: 100%   Filed Weights   06/22/22 1111  Weight: 152 lb 3.2 oz (69 kg)  GENERAL: Well-appearing middle-age Caucasian female, alert, no distress and comfortable SKIN: skin color, texture, turgor are normal, no rashes or significant lesions EYES: conjunctiva are pink and non-injected, sclera clear LUNGS: clear to auscultation and percussion with normal breathing effort HEART: regular rate & rhythm and no murmurs and no lower extremity edema Musculoskeletal: no cyanosis of digits and no clubbing  PSYCH: alert & oriented x 3, fluent speech NEURO: no focal motor/sensory deficits  LABORATORY DATA:  I have reviewed the data as listed    Latest Ref Rng & Units 06/22/2022   10:51 AM 04/27/2022   10:21 AM 02/03/2022   11:08 AM  CBC  WBC 4.0 - 10.5 K/uL 4.6  4.0  3.4   Hemoglobin 12.0 - 15.0 g/dL 14.4  9.3  9.8   Hematocrit 36.0 - 46.0 % 38.0  29.1  31.5   Platelets 150 - 400 K/uL 192  161  210        Latest Ref Rng & Units 06/22/2022   10:51 AM 04/27/2022   10:21 AM 02/03/2022   11:08 AM  CMP  Glucose 70 - 99 mg/dL 93  89  315   BUN 6 - 20 mg/dL 7  8  8    Creatinine 0.44 - 1.00 mg/dL  4.00  8.67   Sodium 135 - 145 mmol/L 143  143  141   Potassium 3.5 - 5.1 mmol/L 3.8  3.6  3.8   Chloride 98 - 111 mmol/L 106  107  103   CO2 22 - 32 mmol/L 30  31  25    Calcium 8.9 - 10.3 mg/dL 9.4  8.6  9.1   Total Protein 6.5 - 8.1 g/dL 6.8  6.0  6.4   Total Bilirubin 0.3 - 1.2 mg/dL 0.3  0.2  6.19   Alkaline Phos 38 - 126 U/L 70  58  83   AST 15 - 41 U/L 14  11  12    ALT 0 - 44  U/L 10  9  9     RADIOGRAPHIC STUDIES: I have personally reviewed the radiological images as listed and agreed with the findings in the report. No results found.  ASSESSMENT & PLAN [name] 46 y.o. female with medical history significant for iron deficiency anemia 2/2 to gastric bypass who presents for a follow up visit.   After review of the labs, review of the records, and discussion with the patient the patients findings are most consistent with iron deficiency anemia secondary to gastric bypass.  At this time would recommend repletion of iron stores with IV iron therapy.  P.o. therapy is generally adequate gastric bypass patients as they do not appropriately absorb p.o. iron therapy.  Additionally they did not absorb iron from food as they should.  As such I would recommend we proceed with IV iron treatment.  Additionally we will check her for vitamin B12 deficiency which can also occur in patients with gastric bypass.  The patient and her husband voiced understanding of the plan moving forward.   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 --P.o. iron therapy will likely be ineffective in a patient with gastric bypass.  We will need to proceed with IV iron therapy if iron  levels drop in the future.  --Labs today show white blood cell count 4.6, hemoglobin 12.5, MCV 89.4, and platelets of 192 --Plan for return to clinic in 6 months time with repeat labs.  No orders of the defined types were placed in this encounter.   All questions were answered. The patient knows to call the clinic with any problems, questions or concerns.  A total of more than 30 minutes were spent on this  encounter with face-to-face time and non-face-to-face time, including preparing to see the patient, ordering tests and/or medications, counseling the patient and coordination of care as outlined above.   Ulysees Barns, MD Department of Hematology/Oncology [facility] at [facility] Phone: [phone] Pager: [phone] Email: [email or web address]  06/23/2022 3:40 PM

## 2022-06-23 ENCOUNTER — Encounter: Payer: Self-pay | Admitting: Hematology and Oncology

## 2022-06-24 ENCOUNTER — Telehealth: Payer: Self-pay | Admitting: *Deleted

## 2022-06-24 NOTE — Telephone Encounter (Signed)
TCT patient regarding recent lab reports.  Spoke with her and advised thatt her iron levels have normalized. There is no need for repeat IV iron at this time. Advised that we will recheck her labs in 6 months. Pt voiced understanding.

## 2022-06-24 NOTE — Telephone Encounter (Signed)
-----   Message from Orson Slick, MD sent at 06/23/2022  3:41 PM EDT ----- Please let Mrs. Oxendine know that her iron levels have completely normalized.  There is no indication for repeat IV iron at this time.  We will plan to see her back in 6 months as scheduled to assure that she is retaining her iron. ----- Message ----- From: Buel Ream, [facility] [place] Sent: 06/22/2022  11:09 AM EDT To: Orson Slick, MD

## 2022-07-20 ENCOUNTER — Encounter (HOSPITAL_BASED_OUTPATIENT_CLINIC_OR_DEPARTMENT_OTHER): Payer: Self-pay | Admitting: Emergency Medicine

## 2022-07-20 ENCOUNTER — Emergency Department (HOSPITAL_BASED_OUTPATIENT_CLINIC_OR_DEPARTMENT_OTHER)
Admission: EM | Admit: 2022-07-20 | Discharge: 2022-07-20 | Disposition: A | Discharge status: Home / Self Care | Payer: BC Managed Care – PPO | Attending: Emergency Medicine | Admitting: Emergency Medicine

## 2022-07-20 DIAGNOSIS — E860 Dehydration: Secondary | ICD-10-CM | POA: Diagnosis present

## 2022-07-20 LAB — CBC
HCT: 39.1 % (ref 36.0–46.0)
Hemoglobin: 12.5 g/dL (ref 12.0–15.0)
MCH: 29.7 pg (ref 26.0–34.0)
MCHC: 32 g/dL (ref 30.0–36.0)
MCV: 92.9 fL (ref 80.0–100.0)
Platelets: 229 10*3/uL (ref 150–400)
RBC: 4.21 MIL/uL (ref 3.87–5.11)
RDW: 15 % (ref 11.5–15.5)
WBC: 6.1 10*3/uL (ref 4.0–10.5)
nRBC: 0 % (ref 0.0–0.2)

## 2022-07-20 LAB — URINALYSIS, ROUTINE W REFLEX MICROSCOPIC
Bilirubin Urine: NEGATIVE
Glucose, UA: NEGATIVE mg/dL
Hgb urine dipstick: NEGATIVE
Ketones, ur: NEGATIVE mg/dL
Nitrite: NEGATIVE
Protein, ur: NEGATIVE mg/dL
Specific Gravity, Urine: 1.023 (ref 1.005–1.030)
pH: 6 (ref 5.0–8.0)

## 2022-07-20 LAB — BASIC METABOLIC PANEL
Anion gap: 10 (ref 5–15)
BUN: 10 mg/dL (ref 6–20)
CO2: 28 mmol/L (ref 22–32)
Calcium: 9.6 mg/dL (ref 8.9–10.3)
Chloride: 98 mmol/L (ref 98–111)
Creatinine, Ser: 0.96 mg/dL (ref 0.44–1.00)
GFR, Estimated: >60 mL/min (ref >60)
Glucose, Bld: 85 mg/dL (ref 70–99)
Potassium: 4.4 mmol/L (ref 3.5–5.1)
Sodium: 136 mmol/L (ref 135–145)

## 2022-07-20 MED ORDER — SODIUM CHLORIDE 0.9 % IV BOLUS
1000.0000 mL | Freq: Once | INTRAVENOUS | Status: AC
  Administered 2022-07-20: 1000 mL via INTRAVENOUS

## 2022-07-20 NOTE — ED Notes (Signed)
Pt a&ox4 lying in bed with spouse at bedside.  No acute distress noted.  Pt reports intermittent HA rated 2/10 with recurrent n/v episodes- reports emesis episode while in triage.  Pt also reports LLQ discomfort which she attributes to possible UTI -- states she typically has pain in lower abd with UTIs which she has frequently throughout the year; has been taking AZO for 2 days and would like UA completed

## 2022-07-20 NOTE — ED Triage Notes (Signed)
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 headache No distress noted in triage Reports that it started 2 weeks ago Was seen yesterday at [facility]

## 2022-07-20 NOTE — ED Notes (Signed)
Pt agreeable with d/c plan as discussed by provider- this nurse has  verbally reinforced d/c instructions and provided pt with written copy - pt acknowledges verbal understanding and denies any additional questions concerns needs- pt ambulatory independently at d/c with steady gait - no distress.  Reports feeling much better prior to d/c; pt reports fluids helped

## 2022-07-20 NOTE — ED Provider Notes (Signed)
[facility] EMERGENCY DEPT Provider Note   CSN: [id number] Arrival date & time: 07/20/22  1456     History  Chief Complaint  Patient presents with   Headache    [name] is a 46 y.o. female.  Patient with history of gastric bypass presents today with complaints of dehydration. She states that since she had her gastric bypass surgery in 2013 she has struggled with her appetite and fluid intake. States that since then a few times each year she requires IV hydration for management of same. States that over the past few days she has been having a headache, went to urgent care for same and was given meds and her headache has resolved. States that urgent care was not able to give her fluids. States that normally when she has a headache of this nature it means that she needs fluids. She denies fevers, chills, chest pain, shortness of breath, headache, vision changes, nausea, vomiting, diarrhea.  The history is provided by the patient. No language interpreter was used.  Headache      Home Medications Prior to Admission medications   Medication Sig Start Date End Date Taking? Authorizing Provider  acetaminophen (TYLENOL) 500 MG tablet Take 500-1,000 mg by mouth every 6 (six) hours as needed for moderate pain.    [provider]  cetirizine (ZYRTEC) 10 MG tablet Take 10 mg by mouth daily. 11/05/20   [provider]  diphenhydrAMINE (BENADRYL) 25 mg capsule Take 1 capsule by mouth daily. Alternates w/ Zyrtec    [provider]  divalproex (DEPAKOTE) 250 MG DR tablet Take 1 tablet (250 mg total) by mouth 2 (two) times daily. 02/16/22   Lomax, Amy, NP  levETIRAcetam (KEPPRA) 1000 MG tablet Take 1 tablet (1,000 mg total) by mouth 2 (two) times daily. 02/16/22   Lomax, Amy, NP  ondansetron (ZOFRAN-ODT) 8 MG disintegrating tablet Take 8 mg by mouth every 8 (eight) hours as needed for nausea or vomiting.    [provider]  sennosides-docusate  sodium (SENOKOT-S) 8.6-50 MG tablet Take 2 tablets by mouth daily as needed for constipation.    [provider]  zolpidem (AMBIEN) 10 MG tablet Take 10 mg by mouth daily as needed for sleep.    [provider]  nebivolol (BYSTOLIC) 5 MG tablet Take 5 mg by mouth daily.    11/21/11  [provider]  Niacin-Simvastatin [facility]) 500-40 MG TB24 Take by mouth 1 day or 1 dose.    11/21/11  [provider]  omeprazole (PRILOSEC) 40 MG capsule Take 40 mg by mouth daily.    11/21/11  [provider]      Allergies    Cephalexin, Cephalosporins, Codeine, Morphine and related, Other, Pentazocine lactate, Prednisone, Rocephin [ceftriaxone sodium in dextrose], Tegretol [carbamazepine], and Tramadol    Review of Systems   Review of Systems  Neurological:  Positive for headaches (currently resolved).  All other systems reviewed and are negative.   Physical Exam Updated Vital Signs BP 119/83 (BP Location: Right Arm)   Pulse 100   Temp 98.1 F (36.7 C) (Oral)   Resp 17   SpO2 100%  Physical Exam Vitals and nursing note reviewed.  Constitutional:      General: She is not in acute distress.    Appearance: Normal appearance. She is normal weight. She is not ill-appearing, toxic-appearing or diaphoretic.  HENT:     Head: Normocephalic and atraumatic.  Eyes:     Extraocular Movements: Extraocular movements intact.  Pupils: Pupils are equal, round, and reactive to light.  Neck:     Comments: No meningismus Cardiovascular:     Rate and Rhythm: Normal rate.  Pulmonary:     Effort: Pulmonary effort is normal. No respiratory distress.  Abdominal:     General: Bowel sounds are normal.     Palpations: Abdomen is soft.  Musculoskeletal:        General: Normal range of motion.     Cervical back: Normal range of motion.  Skin:    General: Skin is warm and dry.  Neurological:     General: No focal deficit present.     Mental Status: She is alert and  oriented to person, place, and time.     GCS: GCS eye subscore is 4. GCS verbal subscore is 5. GCS motor subscore is 6.     Cranial Nerves: No cranial nerve deficit.     Comments: Observed to be ambulatory with steady gait  Psychiatric:        Mood and Affect: Mood normal.        Behavior: Behavior normal.     ED Results / Procedures / Treatments   Labs (all labs ordered are listed, but only abnormal results are displayed) Labs Reviewed  URINALYSIS, ROUTINE W REFLEX MICROSCOPIC - Abnormal; Notable for the following components:      Result Value   Leukocytes,Ua SMALL (*)    All other components within normal limits  BASIC METABOLIC PANEL  CBC    EKG None  Radiology No results found.  Procedures Procedures    Medications Ordered in ED Medications  sodium chloride 0.9 % bolus 1,000 mL (1,000 mLs Intravenous New Bag/Given 07/20/22 1934)    ED Course/ Medical Decision Making/ A&P                           Medical Decision Making Amount and/or Complexity of Data Reviewed Labs: ordered.   Patient presents today with complaints of dehydration. She is afebrile, non-toxic appearing, and in no acute distress with reassuring vital signs. Physical exam reassuring for acute findings.   Laboratory evaluation obtained and reveals no leukocytosis or anemia, no electrolyte abnormalities. UA noninfectious and without ketones. No other acute laboratory findings.  Patient given IV fluids for management of her symptoms per her request. After fluids she states that her symptoms have completely resolved and she is ready to go home. No further emergent concerns, she is stable for discharge. Educated on red flag symptoms that would prompt immediate return. Also emphasized the importance of maintaining adequate hydration and close pcp follow-up. Patient understanding and amenable with plan, patient discharged in stable condition.  Final Clinical Impression(s) / ED Diagnoses Final diagnoses:   Dehydration    Rx / DC Orders ED Discharge Orders     None     An After Visit Summary was printed and given to the patient.     Vear Clock 07/20/22 2117    Pricilla Loveless, MD 07/25/22 [phone]

## 2022-07-20 NOTE — Discharge Instructions (Signed)
As we discussed, your labs were reassuring for acute findings today. It is very important that you maintain adequate oral hydration.  Follow-up with your primary care doctor as needed for your additional health needs.  Return if development of any new or worsening symptoms.

## 2022-07-26 ENCOUNTER — Encounter: Payer: Self-pay | Admitting: Family Medicine

## 2022-07-28 ENCOUNTER — Other Ambulatory Visit: Payer: Self-pay | Admitting: *Deleted

## 2022-07-28 DIAGNOSIS — G40909 Epilepsy, unspecified, not intractable, without status epilepticus: Secondary | ICD-10-CM

## 2022-07-28 MED ORDER — LEVETIRACETAM 1000 MG PO TABS: 1000.0000 mg | ORAL_TABLET | Freq: Two times a day (BID) | ORAL | 2 refills | Status: DC

## 2022-08-15 ENCOUNTER — Other Ambulatory Visit: Payer: Self-pay | Admitting: *Deleted

## 2022-08-15 MED ORDER — DIVALPROEX SODIUM 250 MG PO DR TAB: 250.0000 mg | DELAYED_RELEASE_TABLET | Freq: Two times a day (BID) | ORAL | 1 refills | Status: DC

## 2022-08-21 ENCOUNTER — Ambulatory Visit
Admission: EM | Admit: 2022-08-21 | Discharge: 2022-08-21 | Disposition: A | Discharge status: Home / Self Care | Payer: BC Managed Care – PPO | Attending: Physician Assistant | Admitting: Physician Assistant

## 2022-08-21 DIAGNOSIS — R000 Tachycardia, unspecified: Secondary | ICD-10-CM | POA: Diagnosis not present

## 2022-08-21 DIAGNOSIS — J101 Influenza due to other identified influenza virus with other respiratory manifestations: Secondary | ICD-10-CM | POA: Diagnosis not present

## 2022-08-21 DIAGNOSIS — R509 Fever, unspecified: Secondary | ICD-10-CM

## 2022-08-21 LAB — POCT INFLUENZA A/B
Influenza A, POC: POSITIVE — AB
Influenza B, POC: NEGATIVE

## 2022-08-21 MED ORDER — OSELTAMIVIR PHOSPHATE 75 MG PO CAPS: 75.0000 mg | ORAL_CAPSULE | Freq: Two times a day (BID) | ORAL | 0 refills | Status: DC

## 2022-08-21 MED ORDER — KETOROLAC TROMETHAMINE 30 MG/ML IJ SOLN
30.0000 mg | Freq: Once | INTRAMUSCULAR | Status: AC
  Administered 2022-08-21: 30 mg via INTRAMUSCULAR

## 2022-08-21 MED ORDER — ACETAMINOPHEN 160 MG/5ML PO SOLN
650.0000 mg | Freq: Once | ORAL | Status: AC
  Administered 2022-08-21: 650 mg via ORAL

## 2022-08-21 MED ORDER — PROMETHAZINE-DM 6.25-15 MG/5ML PO SYRP: 5.0000 mL | ORAL_SOLUTION | Freq: Three times a day (TID) | ORAL | 0 refills | Status: DC | PRN

## 2022-08-21 NOTE — ED Triage Notes (Signed)
Pt presents with cough, fatigue, migraine, diarrhea, generalized body aches, sore throat, and dizziness since waking up this morning; pt was diagnosed with flu on Friday.

## 2022-08-21 NOTE — Discharge Instructions (Signed)
You tested positive for influenza A.  We were able to bring fever down with medication.  Please continue taking Tylenol on a scheduled basis to ensure your fever does not get very high.  Your heart rate as high as we discussed.  Make sure you are drinking lots of fluid.  Monitor this at home and if it is remaining above 120 you need to go to the emergency room.  If at any point anything worsens and you have shortness of breath, fever not responding medication, nausea/vomiting interfere with oral intake, weakness you need to go to the emergency room.  You can use Promethazine DM for cough.  This will make you sleepy so do not drive or drink alcohol with taking it.  Start Tamiflu as prescribed.

## 2022-08-21 NOTE — ED Provider Notes (Signed)
[name]    CSN: [id number] Arrival date & time: 08/21/22  1153      History   Chief Complaint Chief Complaint  Patient presents with   Influenza    Husband was dx with flu 2 days ago I amRunning 101.2 fever body aches dizzy extreme headache throbbing headaches neck pain blurry Vision - Entered by patient    HPI [name] is a 46 y.o. female.   Patient presents today with a 2-day history of URI symptoms.  She reports that earlier this week she had a migraine and went to urgent care for Phenergan and Toradol.  The symptoms improved and then she developed fever, tension headache that is not similar to her migraines, nausea, vomiting, diarrhea, body aches, cough, congestion.  Reports that her husband recently tested positive for influenza.  She has been taking Tylenol without improvement of symptoms.  She is up-to-date on influenza and COVID vaccines.  She has not had COVID in the past.  She denies any recent antibiotics or steroids.  Denies history of allergies, asthma, COPD, smoking.    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   Hyperlipidemia    Hypertension    resolved 5 yrs ago   Incontinence    Lumbago    Plantar fasciitis    Seizures ([place])    seizure 11/23/15 and mild seizure 02/08/16   Sinus problem    Sore throat    Wears glasses    Wears glasses    reading glasses only    Patient Active Problem List   Diagnosis Date Noted   Iron deficiency anemia 04/27/2022   Seizure disorder ([place]) 02/11/2019   Hypertension 07/01/2011   Hyperlipidemia 07/01/2011   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 Roux-en-Y   HYSTEROSCOPY WITH NOVASURE  10/15/2004   PLANTAR FASCIA RELEASE Left 08/25/2009   PLANTAR FASCIA RELEASE Right 01/02/2004   TRIGGER FINGER RELEASE Right 06/24/2014   Procedure: RELEASE A-1 PULLEY RIGHT RING FINGER;  Surgeon: Daryll Brod, MD;  Location: [place];  Service: Orthopedics;  Laterality: Right;  ANESTHESIA: IV REGIONAL FAB   TUBAL LIGATION  1999    OB History   No obstetric history on file.      Home Medications    Prior to Admission medications   Medication Sig Start Date End Date Taking? Authorizing Provider  oseltamivir (TAMIFLU) 75 MG capsule Take 1 capsule (75 mg total) by mouth every 12 (twelve) hours. 08/21/22  Yes Mayda Shippee K, PA-C  promethazine-dextromethorphan (PROMETHAZINE-DM) 6.25-15 MG/5ML syrup Take 5 mLs by mouth 3 (three) times daily as needed for cough. 08/21/22  Yes Raylyn Carton, Derry Skill, PA-C  acetaminophen (TYLENOL) 500 MG tablet Take 500-1,000 mg by mouth every 6 (six) hours as needed for moderate pain.    [provider]  cetirizine (ZYRTEC) 10 MG tablet Take 10 mg by mouth daily. 11/05/20   [provider]  diphenhydrAMINE (BENADRYL) 25 mg capsule Take 1 capsule by mouth daily. Alternates w/ Zyrtec    [provider]  divalproex (DEPAKOTE) 250 MG DR tablet Take 1 tablet (250 mg total) by mouth 2 (two) times daily. 08/15/22   Lomax, Amy, NP  levETIRAcetam (KEPPRA) 1000 MG tablet Take 1 tablet (1,000 mg total) by mouth 2 (two) times daily. 07/28/22   Lomax, Amy, NP  ondansetron (ZOFRAN-ODT) 8 MG disintegrating tablet Take 8 mg by mouth every 8 (eight) hours as needed for nausea or vomiting.    [provider]  sennosides-docusate sodium  (SENOKOT-S) 8.6-50 MG tablet Take 2 tablets by mouth daily as needed for constipation.    [provider]  zolpidem (AMBIEN) 10 MG tablet Take 10 mg by mouth daily as needed for sleep.    [provider]  nebivolol (BYSTOLIC) 5 MG tablet Take 5 mg by mouth daily.    11/21/11  [provider]  Niacin-Simvastatin [facility]) 500-40 MG TB24 Take by mouth 1 day or 1 dose.    11/21/11  [provider]  omeprazole (PRILOSEC) 40 MG capsule Take 40 mg by mouth daily.    11/21/11  [provider]    Family History Family History  Problem Relation Age of Onset   Gout Father    Cancer Maternal Grandmother        breast   Breast cancer Maternal Grandmother    Breast cancer Mother    Stroke Paternal Grandmother    Dementia Paternal Grandmother     Social History Social History   Tobacco Use   Smoking status: Never   Smokeless tobacco: Never  Vaping Use   Vaping Use: Never used  Substance Use Topics   Alcohol use: No   Drug use: No     Allergies   Cephalexin, Cephalosporins, Codeine, Morphine and related, Other, Pentazocine lactate, Prednisone, Rocephin [ceftriaxone sodium in dextrose], Tegretol [carbamazepine], and Tramadol   Review of Systems Review of Systems  Constitutional:  Positive for activity change, fatigue and fever. Negative for appetite change.  HENT:  Positive for congestion. Negative for sinus pressure, sneezing and sore throat.   Respiratory:  Positive for cough. Negative for shortness of breath.   Cardiovascular:  Negative for chest pain.  Gastrointestinal:  Positive for diarrhea, nausea and vomiting. Negative for abdominal pain.  Neurological:  Positive for headaches. Negative for dizziness and light-headedness.     Physical Exam Triage Vital Signs ED Triage Vitals  Enc Vitals Group     BP 08/21/22 1234 120/73     Pulse Rate 08/21/22 1234 (S) (!) 140     Resp 08/21/22 1234 20     Temp 08/21/22 1234 (!) 102.8 F (39.3  C)     Temp Source 08/21/22 1234 Oral     SpO2 08/21/22 1234 93 %     Weight --      Height --      Head Circumference --      Peak Flow --      Pain Score 08/21/22 1233 9     Pain Loc --      Pain Edu? --      Excl. in GC? --    No data found.  Updated Vital Signs BP 120/73 (BP Location: Right Arm)   Pulse (!) 137   Temp 99.5 F (37.5 C) (Oral)  Resp 20   SpO2 93%   Visual Acuity Right Eye Distance:   Left Eye Distance:   Bilateral Distance:    Right Eye Near:   Left Eye Near:    Bilateral Near:     Physical Exam Vitals reviewed.  Constitutional:      General: She is awake. She is not in acute distress.    Appearance: Normal appearance. She is well-developed. She is not ill-appearing.     Comments: Very pleasant female appears stated age in no acute distress sitting comfortably in exam room  HENT:     Head: Normocephalic and atraumatic.     Right Ear: Tympanic membrane, ear canal and external ear normal. Tympanic membrane is not erythematous or bulging.     Left Ear: Tympanic membrane, ear canal and external ear normal. Tympanic membrane is not erythematous or bulging.     Nose:     Right Sinus: No maxillary sinus tenderness or frontal sinus tenderness.     Left Sinus: No maxillary sinus tenderness or frontal sinus tenderness.     Mouth/Throat:     Pharynx: Uvula midline. Posterior oropharyngeal erythema present. No oropharyngeal exudate.  Cardiovascular:     Rate and Rhythm: Regular rhythm. Tachycardia present.     Heart sounds: Normal heart sounds, S1 normal and S2 normal. No murmur heard. Pulmonary:     Effort: Pulmonary effort is normal.     Breath sounds: Normal breath sounds. No wheezing, rhonchi or rales.     Comments: Clear to auscultation bilaterally Psychiatric:        Behavior: Behavior is cooperative.      UC Treatments / Results  Labs (all labs ordered are listed, but only abnormal results are displayed) Labs Reviewed  POCT INFLUENZA A/B  - Abnormal; Notable for the following components:      Result Value   Influenza A, POC Positive (*)    All other components within normal limits    EKG   Radiology No results found.  Procedures Procedures (including critical care time)  Medications Ordered in UC Medications  acetaminophen (TYLENOL) 160 MG/5ML solution 650 mg (650 mg Oral Given 08/21/22 1243)  ketorolac (TORADOL) 30 MG/ML injection 30 mg (30 mg Intramuscular Given 08/21/22 1318)    Initial Impression / Assessment and Plan / UC Course  I have reviewed the triage vital signs and the nursing notes.  Pertinent labs & imaging results that were available during my care of the patient were reviewed by me and considered in my medical decision making (see chart for details).     Patient was febrile and tachycardic on intake.  She was given Toradol as she is unable to tolerate oral NSAIDs due to history of gastric bypass as well as Tylenol with improvement of fever but remaining tachycardia.  She denies any dizziness, chest pain, shortness of breath, weakness.  She does have a history of tachycardia and reports that is generally worse when she is ill.  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  If she continues to have tachycardia above 120 she needs to go to the emergency room to which she expressed understanding.  She tested positive for influenza A.  Will start Tamiflu as she is within the 48-hour window of effectiveness for this medication.  She was prescribed Promethazine DM to help with cough and encouraged to use over-the-counter medications for symptom management.  She is to rest  and drink plenty of fluid.  Discussed that if her symptoms are not improving or if anything worsens she needs to go to the emergency room.  Strict return precautions given.  Final Clinical Impressions(s) / UC Diagnoses   Final diagnoses:  Influenza A  Fever, unspecified   Tachycardia     Discharge Instructions      You tested positive for influenza A.  We were able to bring fever down with medication.  Please continue taking Tylenol on a scheduled basis to ensure your fever does not get very high.  Your heart rate as high as we discussed.  Make sure you are drinking lots of fluid.  Monitor this at home and if it is remaining above 120 you need to go to the emergency room.  If at any point anything worsens and you have shortness of breath, fever not responding medication, nausea/vomiting interfere with oral intake, weakness you need to go to the emergency room.  You can use Promethazine DM for cough.  This will make you sleepy so do not drive or drink alcohol with taking it.  Start Tamiflu as prescribed.     ED Prescriptions     Medication Sig Dispense Auth. Provider   oseltamivir (TAMIFLU) 75 MG capsule Take 1 capsule (75 mg total) by mouth every 12 (twelve) hours. 10 capsule Analya Louissaint K, PA-C   promethazine-dextromethorphan (PROMETHAZINE-DM) 6.25-15 MG/5ML syrup Take 5 mLs by mouth 3 (three) times daily as needed for cough. 118 mL Zigmund Linse K, PA-C      PDMP not reviewed this encounter.   Terrilee Croak, PA-C 08/21/22 1343

## 2022-09-01 IMAGING — CT CT ABD-PELV W/ CM
2 of 5 series · 16 of 46 positions shown, 18 images · IV contrast (omnipaque)
Comparison: Radiography same day.  CT 03/09/2020.

CLINICAL DATA: Abdominal pain and fever.  Crampy pain.

EXAM:
CT ABDOMEN AND PELVIS WITH CONTRAST
TECHNIQUE: Multidetector CT imaging of the abdomen and pelvis was performed
using the standard protocol following bolus administration of
intravenous contrast.
CONTRAST:  75mL OMNIPAQUE IOHEXOL 300 MG/ML  SOLN

[Series 2: axial st · axial · 0.73mm/px · z∈[+844,+1239]mm · 13 of 91 slices shown, 15 images]
[im 6/91  soft-tissue]
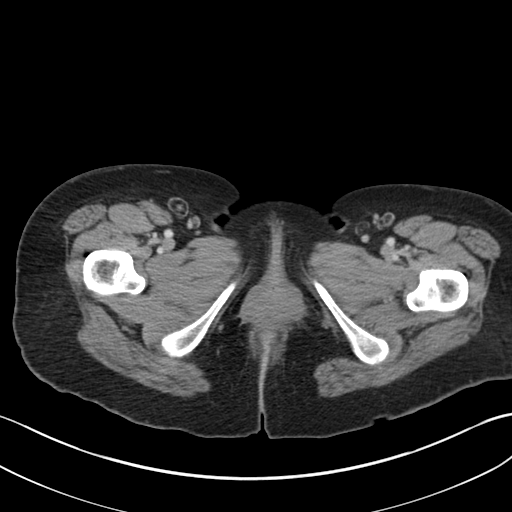
[im 6/91  bone]
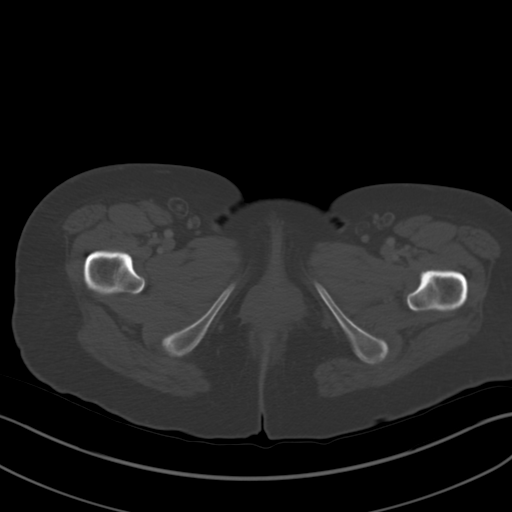
[im 12/91  soft-tissue]
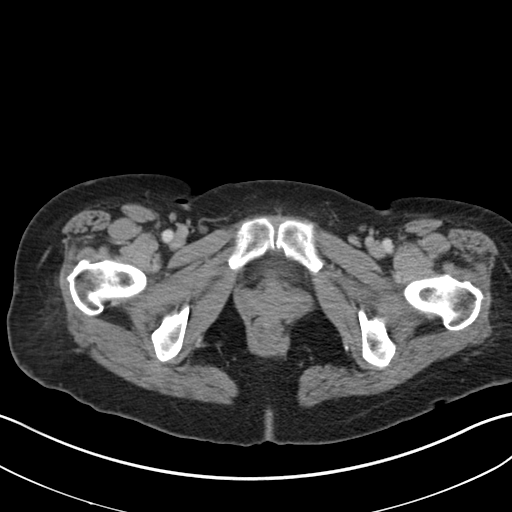
[im 17/91  soft-tissue]
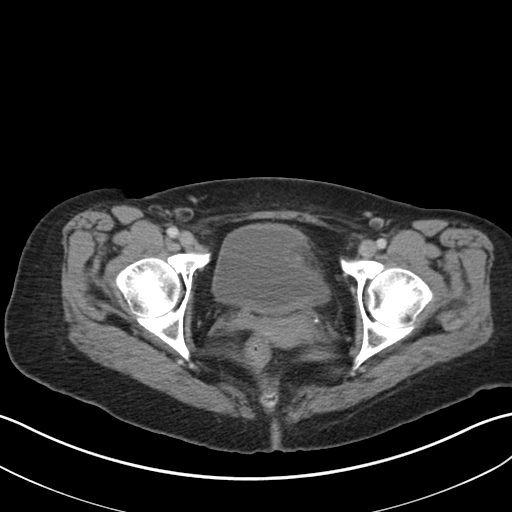
[im 29/91  soft-tissue]
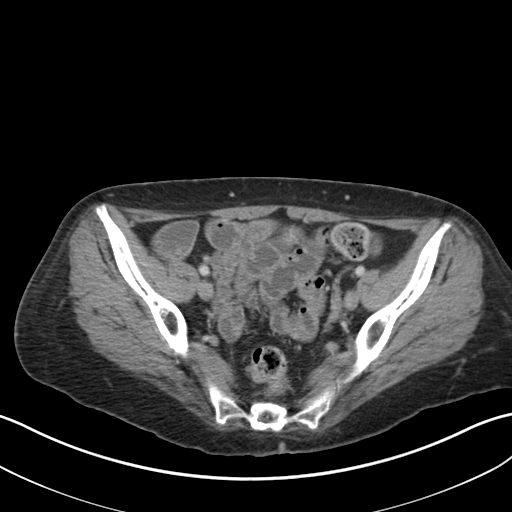
[im 34/91  soft-tissue]
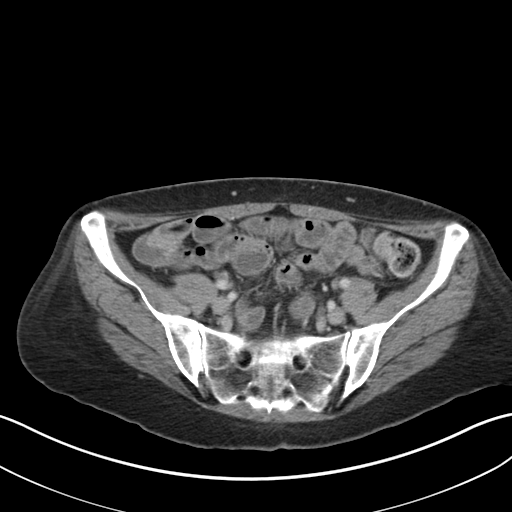
[im 40/91  soft-tissue]
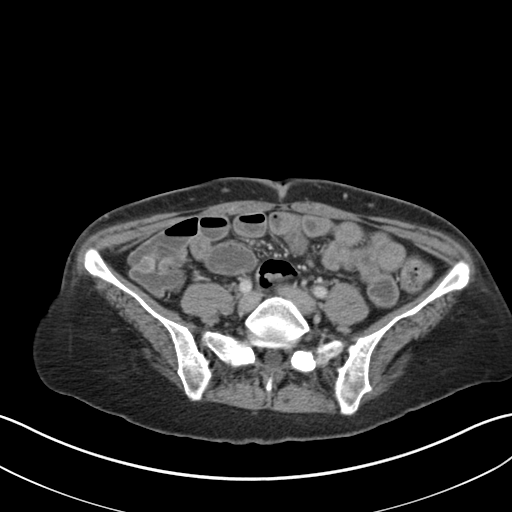
[im 46/91  soft-tissue]
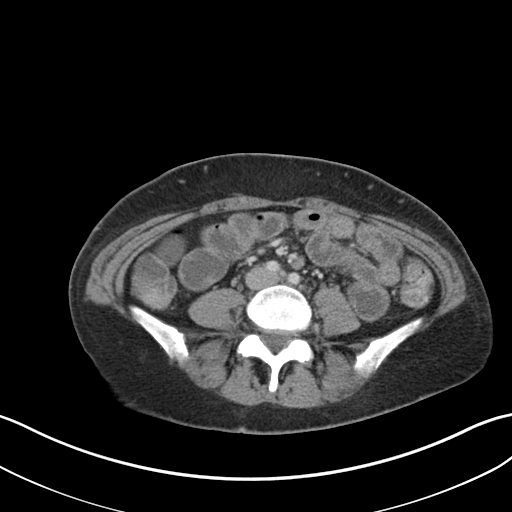
[im 51/91  soft-tissue]
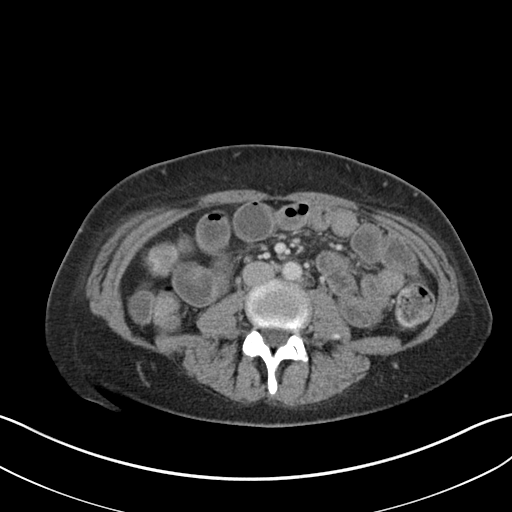
[im 57/91  soft-tissue]
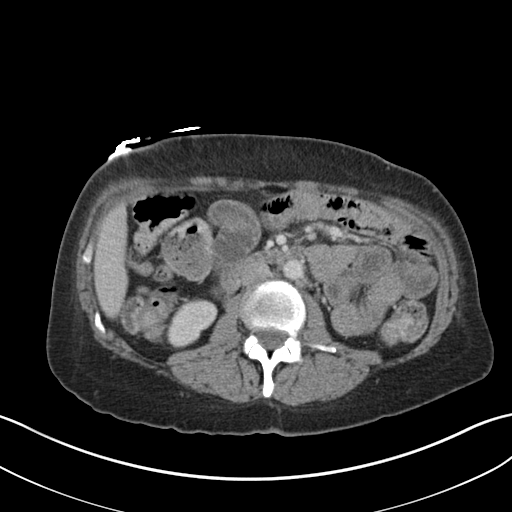
[im 57/91  bone]
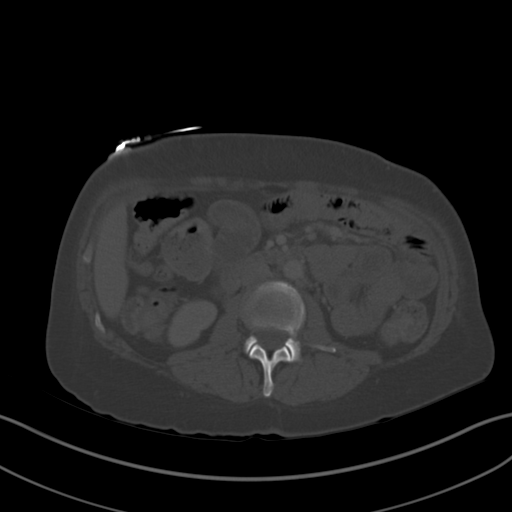
[im 62/91  soft-tissue]
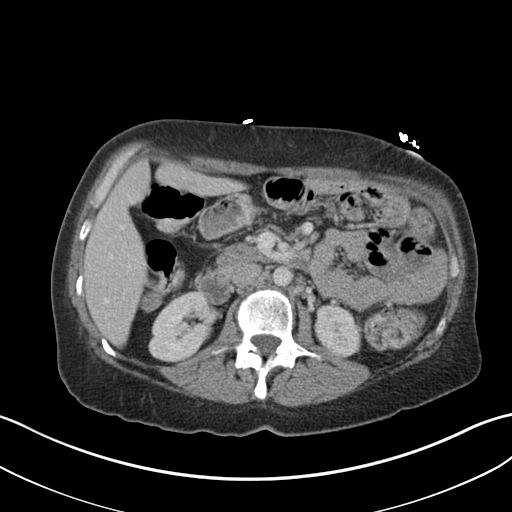
[im 74/91  soft-tissue]
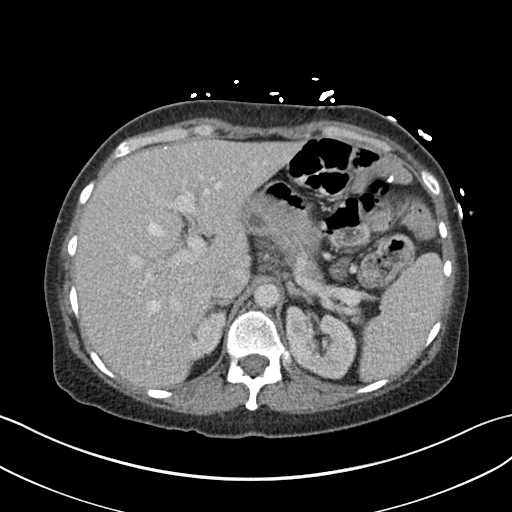
[im 79/91  soft-tissue]
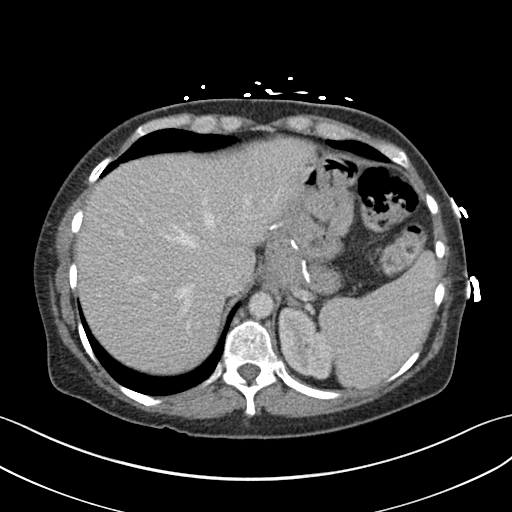
[im 85/91  soft-tissue]
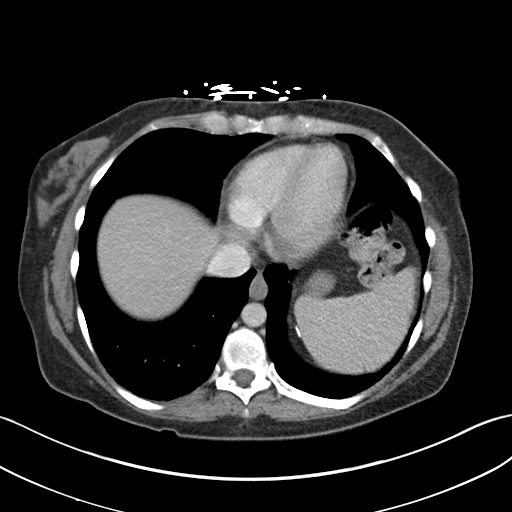

[Series 5: coronal st · coronal · 0.74mm/px · 3 of 127 slices shown]
[im 43/127  soft-tissue]
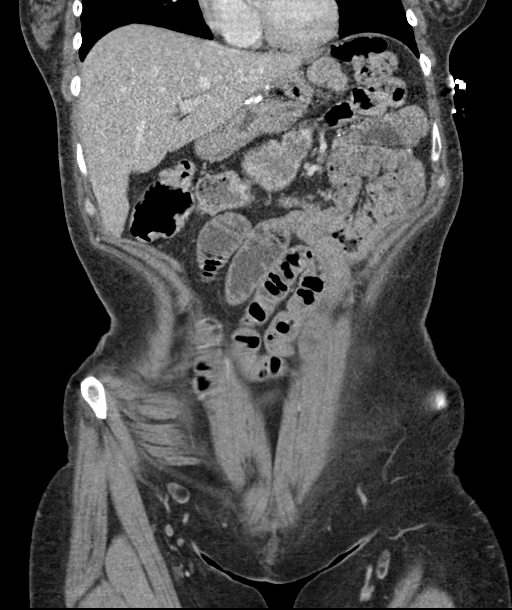
[im 57/127  soft-tissue]
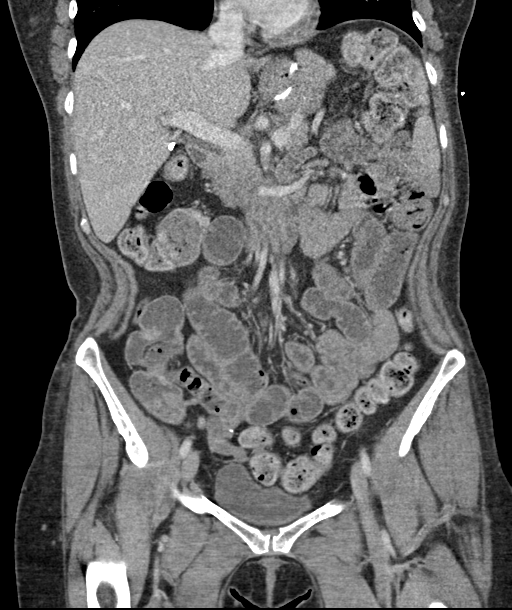
[im 71/127  soft-tissue]
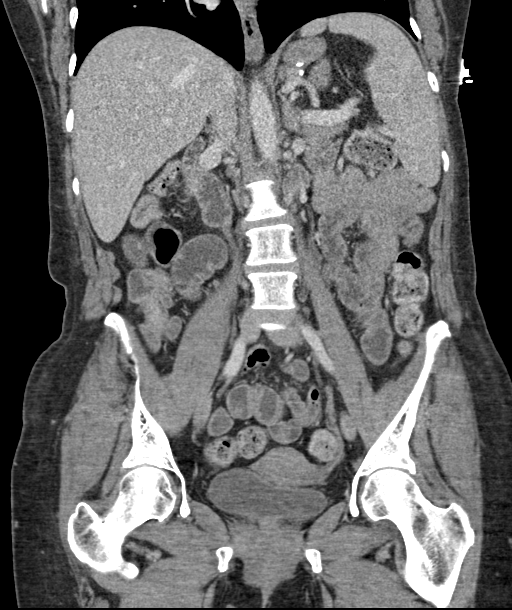

[16 of 46 positions shown; findings below may reference images not displayed]

FINDINGS: Lower chest: Lung bases are clear.

Hepatobiliary: Previous cholecystectomy. Liver parenchyma is normal.

Pancreas: Normal

Spleen: Normal

Adrenals/Urinary Tract: Adrenal glands are normal. Kidneys are
normal except for a nonobstructing 1 mm stone in the midportion and
lower pole of the left kidney. No evidence of passing stone. Bladder
appears normal.

Stomach/Bowel: Previous Roux-en-Y gastric bypass. No complicating
feature in that region. Small and large bowel appear normal
presently. No evidence of enteritis. Normal appearing appendix.
Colon appears normal. No evidence of diverticulosis or
diverticulitis.

Vascular/Lymphatic: Normal

Reproductive: Normal.  No pelvic mass.

Other: No free fluid or air.

Musculoskeletal: Normal
IMPRESSION: No acute finding to explain the clinical presentation. No evidence
of acute abdominal organ pathology. 2 punctate nonobstructing stones
in the left kidney.

Previous Roux-en-Y gastric bypass without complicating feature.
Previous cholecystectomy.

No evidence acute bowel pathology of any sort. The appendix is
normal.

## 2022-09-18 ENCOUNTER — Ambulatory Visit
Admission: EM | Admit: 2022-09-18 | Discharge: 2022-09-18 | Disposition: A | Discharge status: Home / Self Care | Payer: BC Managed Care – PPO | Attending: Emergency Medicine | Admitting: Emergency Medicine

## 2022-09-18 DIAGNOSIS — B349 Viral infection, unspecified: Secondary | ICD-10-CM | POA: Diagnosis not present

## 2022-09-18 DIAGNOSIS — Z20822 Contact with and (suspected) exposure to covid-19: Secondary | ICD-10-CM | POA: Diagnosis not present

## 2022-09-18 LAB — POCT INFLUENZA A/B
Influenza A, POC: NEGATIVE
Influenza B, POC: NEGATIVE

## 2022-09-18 MED ORDER — PROMETHAZINE-DM 6.25-15 MG/5ML PO SYRP: 5.0000 mL | ORAL_SOLUTION | Freq: Four times a day (QID) | ORAL | 0 refills | Status: DC | PRN

## 2022-09-18 NOTE — Discharge Instructions (Signed)
Today you are being treated prophylactically for COVID, unable to take antivirals as they do not, and tablet form however this medication is not needed as symptoms will progress to your body similar to a cold and improve with time  You may use cough syrup every 6 hours as needed  Flu testing negative    You can take Tylenol and/or Ibuprofen as needed for fever reduction and pain relief.   For cough: honey 1/2 to 1 teaspoon (you can dilute the honey in water or another fluid).  You can also use guaifenesin and dextromethorphan for cough. You can use a humidifier for chest congestion and cough.  If you don't have a humidifier, you can sit in the bathroom with the hot shower running.      For sore throat: try warm salt water gargles, cepacol lozenges, throat spray, warm tea or water with lemon/honey, popsicles or ice, or OTC cold relief medicine for throat discomfort.   For congestion: take a daily anti-histamine like Zyrtec, Claritin, and a oral decongestant, such as pseudoephedrine.  You can also use Flonase 1-2 sprays in each nostril daily.   It is important to stay hydrated: drink plenty of fluids (water, gatorade/powerade/pedialyte, juices, or teas) to keep your throat moisturized and help further relieve irritation/discomfort.

## 2022-09-18 NOTE — ED Provider Notes (Signed)
[name]    CSN: [id number] Arrival date & time: 09/18/22  0857      History   Chief Complaint Chief Complaint  Patient presents with   Covid Exposure     HPI [name] is a 47 y.o. female.   Patient presents for evaluation of nasal congestion, rhinorrhea, sore throat, cough and diarrhea beginning 4 days ago.  Associated fever peaking at 100.8.  Last occurrence of diarrhea overnight, endorses 5-6 episodes 1 day ago, stool is described as soft.  Known exposure to COVID 4 days prior.  Denies shortness of breath, wheezing.  Denies respiratory history.  Non-smoker.    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   Hyperlipidemia    Hypertension    resolved 5 yrs ago   Incontinence    Lumbago    Plantar fasciitis    Seizures (HCC)    seizure 11/23/15 and mild seizure 02/08/16   Sinus problem    Sore throat    Wears glasses    Wears glasses    reading glasses only    Patient Active Problem List   Diagnosis Date Noted   Iron deficiency anemia 04/27/2022   Seizure disorder (HCC) 02/11/2019   Hypertension 07/01/2011   Hyperlipidemia 07/01/2011   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 Roux-en-Y   HYSTEROSCOPY WITH NOVASURE  10/15/2004   PLANTAR FASCIA RELEASE Left 08/25/2009   PLANTAR FASCIA RELEASE Right 01/02/2004   TRIGGER FINGER RELEASE Right 06/24/2014    Procedure: RELEASE A-1 PULLEY RIGHT RING FINGER;  Surgeon: Cindee Salt, MD;  Location: [facility];  Service: Orthopedics;  Laterality: Right;  ANESTHESIA: IV REGIONAL FAB   TUBAL LIGATION  1999    OB History   No obstetric history on file.      Home Medications    Prior to Admission medications   Medication Sig Start Date End Date Taking? Authorizing Provider  acetaminophen (TYLENOL) 500 MG tablet Take 500-1,000 mg by mouth every 6 (six) hours as needed for moderate pain.    [provider]  cetirizine (ZYRTEC) 10 MG tablet Take 10 mg by mouth daily. 11/05/20   [provider]  diphenhydrAMINE (BENADRYL) 25 mg capsule Take 1 capsule by mouth daily. Alternates w/ Zyrtec    [provider]  divalproex (DEPAKOTE) 250 MG DR tablet Take 1 tablet (250 mg total) by mouth 2 (two) times daily. 08/15/22   Lomax, Amy, NP  levETIRAcetam (KEPPRA) 1000 MG tablet Take 1 tablet (1,000 mg total) by mouth 2 (two) times daily. 07/28/22   Lomax, Amy, NP  ondansetron (ZOFRAN-ODT) 8 MG disintegrating tablet Take 8 mg by mouth every 8 (eight) hours as needed for nausea or vomiting.    [provider]  oseltamivir (TAMIFLU) 75 MG capsule Take 1 capsule (75  mg total) by mouth every 12 (twelve) hours. 08/21/22   Raspet, Derry Skill, PA-C  promethazine-dextromethorphan (PROMETHAZINE-DM) 6.25-15 MG/5ML syrup Take 5 mLs by mouth 3 (three) times daily as needed for cough. 08/21/22   Raspet, Derry Skill, PA-C  sennosides-docusate sodium (SENOKOT-S) 8.6-50 MG tablet Take 2 tablets by mouth daily as needed for constipation.    [provider]  zolpidem (AMBIEN) 10 MG tablet Take 10 mg by mouth daily as needed for sleep.    [provider]  nebivolol (BYSTOLIC) 5 MG tablet Take 5 mg by mouth daily.    11/21/11  [provider]  Niacin-Simvastatin [facility]) 500-40 MG TB24 Take by mouth 1 day or 1 dose.    11/21/11  [provider]  omeprazole  (PRILOSEC) 40 MG capsule Take 40 mg by mouth daily.    11/21/11  [provider]    Family History Family History  Problem Relation Age of Onset   Gout Father    Cancer Maternal Grandmother        breast   Breast cancer Maternal Grandmother    Breast cancer Mother    Stroke Paternal Grandmother    Dementia Paternal Grandmother     Social History Social History   Tobacco Use   Smoking status: Never   Smokeless tobacco: Never  Vaping Use   Vaping Use: Never used  Substance Use Topics   Alcohol use: No   Drug use: No     Allergies   Cephalexin, Cephalosporins, Codeine, Morphine and related, Other, Pentazocine lactate, Prednisone, Rocephin [ceftriaxone sodium in dextrose], Tegretol [carbamazepine], and Tramadol   Review of Systems Review of Systems  Constitutional: Negative.   HENT:  Positive for congestion, rhinorrhea and sore throat. Negative for dental problem, drooling, ear discharge, ear pain, facial swelling, hearing loss, mouth sores, nosebleeds, postnasal drip, sinus pressure, sinus pain, sneezing, tinnitus, trouble swallowing and voice change.   Respiratory:  Positive for cough. Negative for apnea, choking, chest tightness, shortness of breath, wheezing and stridor.   Cardiovascular: Negative.   Gastrointestinal:  Positive for diarrhea. Negative for abdominal distention, abdominal pain, anal bleeding, blood in stool, constipation, nausea, rectal pain and vomiting.  Skin: Negative.      Physical Exam Triage Vital Signs ED Triage Vitals [09/18/22 0927]  Enc Vitals Group     BP 133/89     Pulse Rate (!) 103     Resp 18     Temp (!) 101.5 F (38.6 C)     Temp Source Oral     SpO2 93 %     Weight      Height      Head Circumference      Peak Flow      Pain Score 7     Pain Loc      Pain Edu?      Excl. in [place]?    No data found.  Updated Vital Signs BP 133/89 (BP Location: Left Arm)   Pulse (!) 103   Temp (!) 101.5 F (38.6 C) (Oral)   Resp  18   SpO2 93%   Visual Acuity Right Eye Distance:   Left Eye Distance:   Bilateral Distance:    Right Eye Near:   Left Eye Near:    Bilateral Near:     Physical Exam Constitutional:      Appearance: She is ill-appearing.  HENT:     Head: Normocephalic.     Right Ear: Tympanic membrane, ear canal and external  ear normal.     Left Ear: Tympanic membrane, ear canal and external ear normal.     Nose: Congestion and rhinorrhea present.     Mouth/Throat:     Mouth: Mucous membranes are moist.     Pharynx: Oropharynx is clear.  Eyes:     Extraocular Movements: Extraocular movements intact.  Cardiovascular:     Rate and Rhythm: Normal rate and regular rhythm.     Pulses: Normal pulses.     Heart sounds: Normal heart sounds.  Pulmonary:     Effort: Pulmonary effort is normal.     Breath sounds: Normal breath sounds.  Skin:    General: Skin is warm and dry.  Neurological:     Mental Status: She is alert and oriented to person, place, and time. Mental status is at baseline.  Psychiatric:        Mood and Affect: Mood normal.        Behavior: Behavior normal.      UC Treatments / Results  Labs (all labs ordered are listed, but only abnormal results are displayed) Labs Reviewed - No data to display  EKG   Radiology No results found.  Procedures Procedures (including critical care time)  Medications Ordered in UC Medications - No data to display  Initial Impression / Assessment and Plan / UC Course  I have reviewed the triage vital signs and the nursing notes.  Pertinent labs & imaging results that were available during my care of the patient were reviewed by me and considered in my medical decision making (see chart for details).  Viral illness, close exposure to COVID  Fever of 101.5 with associated tachycardia noted in triage, while ill-appearing patient is in no signs of distress nor toxic, lungs are clear to auscultation and O2 saturation is greater than 90%,  flu testing negative , exposure to COVID's family members will defer testing and move forward with treatment prophylactically, unable to take antivirals as she cannot tolerate capsules, prescribed Promethazine DM for management of his cough as it is the most worrisome symptom as she endorses success with this medication in the past, may use additional over-the-counter medications as needed for supportive care Final Clinical Impressions(s) / UC Diagnoses   Final diagnoses:  None   Discharge Instructions   None    ED Prescriptions   None    PDMP not reviewed this encounter.   Hans Eden, NP 09/18/22 1022

## 2022-09-18 NOTE — ED Triage Notes (Signed)
Pt presents with generalized body aches, headache, congestion, and shortness of breath X 3 days

## 2022-10-06 ENCOUNTER — Encounter: Payer: Self-pay | Admitting: Family Medicine

## 2022-10-17 ENCOUNTER — Telehealth: Payer: Self-pay

## 2022-10-17 NOTE — Telephone Encounter (Signed)
Called pt and pt stated that she needs all of her medications list since 08/30/2019 for the [facility] because her 30 days are about to be up. Told pt that I will let the doctor know.

## 2022-10-18 ENCOUNTER — Telehealth: Payer: Self-pay

## 2022-10-18 ENCOUNTER — Telehealth: Payer: Self-pay | Admitting: *Deleted

## 2022-10-18 DIAGNOSIS — D508 Other iron deficiency anemias: Secondary | ICD-10-CM

## 2022-10-18 NOTE — Telephone Encounter (Signed)
Loral went to see PCP earlier this week. They called and said she is anemic and wanted her to come back to check iron. She informed them that she has been treated with IV iron in the past by Dr Lorenso Courier. PCP faxed lab results to our office.

## 2022-10-18 NOTE — Telephone Encounter (Signed)
Notified that we have ordered labs this week and see Dr Lorenso Courier next week.

## 2022-10-18 NOTE — Telephone Encounter (Signed)
Called pt and asked pt to send [place] everything from the [facility] so that we can best help with her 30 day requirement. Pt said that she gave [place] everything and everything was signed and she is insisting on getting a full medications list stating all of her medications from 08/2019 until now. Told pt we were unable to do that.

## 2022-10-20 ENCOUNTER — Inpatient Hospital Stay: Payer: BC Managed Care – PPO | Attending: Hematology and Oncology

## 2022-10-20 ENCOUNTER — Other Ambulatory Visit: Payer: Self-pay

## 2022-10-20 DIAGNOSIS — D508 Other iron deficiency anemias: Secondary | ICD-10-CM | POA: Diagnosis present

## 2022-10-20 DIAGNOSIS — Z9884 Bariatric surgery status: Secondary | ICD-10-CM | POA: Insufficient documentation

## 2022-10-20 LAB — IRON AND IRON BINDING CAPACITY (CC-WL,HP ONLY)
Iron: 69 ug/dL (ref 28–170)
Saturation Ratios: 24 % (ref 10.4–31.8)
TIBC: 286 ug/dL (ref 250–450)
UIBC: 217 ug/dL (ref 148–442)

## 2022-10-20 LAB — FERRITIN: Ferritin: 63 ng/mL (ref 11–307)

## 2022-10-20 LAB — CBC WITH DIFFERENTIAL (CANCER CENTER ONLY)
Abs Immature Granulocytes: 0.01 10*3/uL (ref 0.00–0.07)
Basophils Absolute: 0 10*3/uL (ref 0.0–0.1)
Basophils Relative: 0 %
Eosinophils Absolute: 0.2 10*3/uL (ref 0.0–0.5)
Eosinophils Relative: 5 %
HCT: 35.8 % — ABNORMAL LOW (ref 36.0–46.0)
Hemoglobin: 12 g/dL (ref 12.0–15.0)
Immature Granulocytes: 0 %
Lymphocytes Relative: 42 %
Lymphs Abs: 2 10*3/uL (ref 0.7–4.0)
MCH: 31.5 pg (ref 26.0–34.0)
MCHC: 33.5 g/dL (ref 30.0–36.0)
MCV: 94 fL (ref 80.0–100.0)
Monocytes Absolute: 0.3 10*3/uL (ref 0.1–1.0)
Monocytes Relative: 7 %
Neutro Abs: 2.1 10*3/uL (ref 1.7–7.7)
Neutrophils Relative %: 46 %
Platelet Count: 144 10*3/uL — ABNORMAL LOW (ref 150–400)
RBC: 3.81 MIL/uL — ABNORMAL LOW (ref 3.87–5.11)
RDW: 12.8 % (ref 11.5–15.5)
WBC Count: 4.6 10*3/uL (ref 4.0–10.5)
nRBC: 0 % (ref 0.0–0.2)

## 2022-10-21 ENCOUNTER — Inpatient Hospital Stay: Payer: BC Managed Care – PPO

## 2022-10-21 ENCOUNTER — Encounter: Payer: Self-pay | Admitting: Hematology and Oncology

## 2022-11-01 ENCOUNTER — Ambulatory Visit
Admission: EM | Admit: 2022-11-01 | Discharge: 2022-11-01 | Disposition: A | Discharge status: Home / Self Care | Payer: BC Managed Care – PPO

## 2022-11-01 ENCOUNTER — Inpatient Hospital Stay: Payer: BC Managed Care – PPO | Admitting: Physician Assistant

## 2022-11-01 DIAGNOSIS — S61412A Laceration without foreign body of left hand, initial encounter: Secondary | ICD-10-CM

## 2022-11-01 DIAGNOSIS — L03114 Cellulitis of left upper limb: Secondary | ICD-10-CM

## 2022-11-01 DIAGNOSIS — Z4802 Encounter for removal of sutures: Secondary | ICD-10-CM | POA: Diagnosis not present

## 2022-11-01 MED ORDER — DOXYCYCLINE HYCLATE 100 MG PO CAPS: 100.0000 mg | ORAL_CAPSULE | Freq: Two times a day (BID) | ORAL | 0 refills | Status: DC

## 2022-11-01 NOTE — ED Triage Notes (Signed)
Pt present for suture removal on her left hand. Pt reports pain and swelling from the suture.

## 2022-11-01 NOTE — ED Provider Notes (Signed)
[name]    CSN: [id number] Arrival date & time: 11/01/22  1909      History   Chief Complaint Chief Complaint  Patient presents with   Hand Problem   Suture / Staple Removal    HPI [name] is a 47 y.o. female.   Patient presents today for suture removal.  Patient had laceration to left hand that occurred on 10/23/2022 where 5 sutures were placed.  She is also concerned today given that she has had some increased redness, swelling, pus over the past few days.  Patient states that her dog's paw hit the laceration so she is not sure if this is related.  Denies any associated fever.  Denies numbness or tingling.  Has full range of motion of hand.   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   Hyperlipidemia    Hypertension    resolved 5 yrs ago   Incontinence    Lumbago    Plantar fasciitis    Seizures ([place])    seizure 11/23/15 and mild seizure 02/08/16   Sinus problem    Sore throat    Wears glasses    Wears glasses    reading glasses only    Patient Active Problem List   Diagnosis Date Noted   Iron deficiency anemia 04/27/2022   Seizure disorder ([place]) 02/11/2019   Hypertension 07/01/2011   Hyperlipidemia 07/01/2011   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 Roux-en-Y   HYSTEROSCOPY WITH NOVASURE  10/15/2004    PLANTAR FASCIA RELEASE Left 08/25/2009   PLANTAR FASCIA RELEASE Right 01/02/2004   TRIGGER FINGER RELEASE Right 06/24/2014   Procedure: RELEASE A-1 PULLEY RIGHT RING FINGER;  Surgeon: Daryll Brod, MD;  Location: [facility];  Service: Orthopedics;  Laterality: Right;  ANESTHESIA: IV REGIONAL FAB   TUBAL LIGATION  1999    OB History   No obstetric history on file.      Home Medications    Prior to Admission medications   Medication Sig Start Date End Date Taking? Authorizing Provider  ALPRAZolam Duanne Moron) 0.25 MG tablet  02/26/20  Yes [provider]  amitriptyline (ELAVIL) 25 MG tablet  03/08/20  Yes [provider]  cetirizine (ZYRTEC) 10 MG tablet Take 10 mg by mouth daily. 11/05/20  Yes [provider]  divalproex (DEPAKOTE) 250 MG DR tablet Take 1 tablet (250 mg total) by mouth 2 (two) times daily. 08/15/22  Yes Lomax, Amy, NP  doxycycline (VIBRAMYCIN) 100 MG capsule Take 1 capsule (100 mg total) by mouth 2 (two) times daily. 11/01/22  Yes Latha Staunton, Hildred Alamin E, FNP  zolpidem (AMBIEN) 10 MG tablet Take 10 mg by mouth daily as needed for sleep.   Yes [provider]  acetaminophen (TYLENOL) 500 MG  tablet Take 500-1,000 mg by mouth every 6 (six) hours as needed for moderate pain.    [provider]  diphenhydrAMINE (BENADRYL) 25 mg capsule Take 1 capsule by mouth daily. Alternates w/ Zyrtec    [provider]  levETIRAcetam (KEPPRA) 1000 MG tablet Take 1 tablet (1,000 mg total) by mouth 2 (two) times daily. 07/28/22   Lomax, Amy, NP  ondansetron (ZOFRAN-ODT) 8 MG disintegrating tablet Take 8 mg by mouth every 8 (eight) hours as needed for nausea or vomiting.    [provider]  oseltamivir (TAMIFLU) 75 MG capsule Take 1 capsule (75 mg total) by mouth every 12 (twelve) hours. 08/21/22   Raspet, Derry Skill, PA-C  promethazine-dextromethorphan (PROMETHAZINE-DM) 6.25-15 MG/5ML syrup Take 5 mLs by mouth 4 (four) times daily as needed  for cough. 09/18/22   White, Leitha Schuller, NP  sennosides-docusate sodium (SENOKOT-S) 8.6-50 MG tablet Take 2 tablets by mouth daily as needed for constipation.    [provider]  nebivolol (BYSTOLIC) 5 MG tablet Take 5 mg by mouth daily.    11/21/11  [provider]  Niacin-Simvastatin [facility]) 500-40 MG TB24 Take by mouth 1 day or 1 dose.    11/21/11  [provider]  omeprazole (PRILOSEC) 40 MG capsule Take 40 mg by mouth daily.    11/21/11  [provider]    Family History Family History  Problem Relation Age of Onset   Gout Father    Cancer Maternal Grandmother        breast   Breast cancer Maternal Grandmother    Breast cancer Mother    Stroke Paternal Grandmother    Dementia Paternal Grandmother     Social History Social History   Tobacco Use   Smoking status: Never   Smokeless tobacco: Never  Vaping Use   Vaping Use: Never used  Substance Use Topics   Alcohol use: No   Drug use: No     Allergies   Cephalexin, Cephalosporins, Codeine, Morphine and related, Other, Pentazocine lactate, Prednisone, Rocephin [ceftriaxone sodium in dextrose], Tegretol [carbamazepine], and Tramadol   Review of Systems Review of Systems Per HPI  Physical Exam Triage Vital Signs ED Triage Vitals [11/01/22 1934]  Enc Vitals Group     BP (!) 142/85     Pulse Rate 100     Resp 18     Temp 97.7 F (36.5 C)     Temp Source Oral     SpO2 100 %     Weight      Height      Head Circumference      Peak Flow      Pain Score      Pain Loc      Pain Edu?      Excl. in [place]?    No data found.  Updated Vital Signs BP (!) 142/85 (BP Location: Left Arm)   Pulse 100   Temp 97.7 F (36.5 C) (Oral)   Resp 18   SpO2 100%   Visual Acuity Right Eye Distance:   Left Eye Distance:   Bilateral Distance:    Right Eye Near:   Left Eye Near:    Bilateral Near:     Physical Exam Constitutional:      General: She is not in acute distress.     Appearance: Normal appearance. She is not toxic-appearing or diaphoretic.  HENT:     Head: Normocephalic and atraumatic.  Eyes:     Extraocular Movements: Extraocular movements  intact.     Conjunctiva/sclera: Conjunctivae normal.  Pulmonary:     Effort: Pulmonary effort is normal.  Skin:    Comments: Patient has approximately 2 to 2.5 cm linear laceration present to dorsal surface of left hand in between first and second digit.  No bleeding noted.  No obvious purulent drainage.  5 sutures are in place with close approximation of wound edges.  There is minimal erythema and swelling noted surrounding laceration.  Patient has full range of motion of hand.  Neurovascular intact.  Neurological:     General: No focal deficit present.     Mental Status: She is alert and oriented to person, place, and time. Mental status is at baseline.  Psychiatric:        Mood and Affect: Mood normal.        Behavior: Behavior normal.        Thought Content: Thought content normal.        Judgment: Judgment normal.      UC Treatments / Results  Labs (all labs ordered are listed, but only abnormal results are displayed) Labs Reviewed - No data to display  EKG   Radiology No results found.  Procedures Procedures (including critical care time)  Medications Ordered in UC Medications - No data to display  Initial Impression / Assessment and Plan / UC Course  I have reviewed the triage vital signs and the nursing notes.  Pertinent labs & imaging results that were available during my care of the patient were reviewed by me and considered in my medical decision making (see chart for details).     Sutures removed without difficulty by clinical staff.  Wound edges are still closely approximated.  Mildly concerned about very mild infection of the hand.  Will treat with doxycycline given patient's antibiotic allergies.  Patient advised to monitor very closely for any increasing signs of infection or  complication.  Advised her to follow-up with urgent care or PCP if this occurs.  Patient verbalized understanding and was agreeable with plan. Final Clinical Impressions(s) / UC Diagnoses   Final diagnoses:  Laceration of left hand without foreign body, initial encounter  Visit for suture removal  Cellulitis of left hand     Discharge Instructions      I have prescribed an antibiotic.  Watch closely for any increased redness, swelling, pus.  Follow-up if this occurs.  Take antibiotic with food to avoid stomach upset.    ED Prescriptions     Medication Sig Dispense Auth. Provider   doxycycline (VIBRAMYCIN) 100 MG capsule Take 1 capsule (100 mg total) by mouth 2 (two) times daily. 20 capsule Teodora Medici, [place]      PDMP not reviewed this encounter.   Teodora Medici, [place] 11/01/22 2014

## 2022-11-01 NOTE — Discharge Instructions (Signed)
I have prescribed an antibiotic.  Watch closely for any increased redness, swelling, pus.  Follow-up if this occurs.  Take antibiotic with food to avoid stomach upset.

## 2022-12-20 NOTE — Progress Notes (Unsigned)
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 Leonides Schanz  05/09/2022: 1000 mg IV monoferric administered 06/22/2022: WBC 4.6, Hgb 12.5, MCV 89.4, Plt 192  Interval History:  [name] 47 y.o. female with medical history significant for iron deficiency anemia 2/2 to gastric bypass who presents for a follow up visit. The patient's last visit was on 06/22/2022. In the interim since the last visit she has had no major changes in her health.  On exam today [name] reports that she did have the flu right before Christmas.  Her energy levels have been still terrible since that time.  She reports that she feels quite tired/exhausted.  She has also been working quite hard.  She notes that she recently had her hemoglobin checked and it was 11.4.  Today is at 11.7.  She reports that her sleep is "hit and miss".  She notes that she also was seen by OB/GYN who noted that she is currently going through menopause.  She is having hot flashes and started progestin and estrogen pills.  She reports her appetite is so-so and she does have some occasional lightheadedness with no shortness of breath.  She is prone to occasional migraines.  Some of this may be worsened by the fact that her neighbor is burning fields for agricultural purposes.  She otherwise denies any fevers, chills, sweats, nausea, vomiting or diarrhea.  Full 10 point ROS was otherwise negative.  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   Hyperlipidemia    Hypertension    resolved 5 yrs ago   Incontinence    Lumbago    Plantar fasciitis    Seizures (HCC)    seizure 11/23/15 and mild seizure 02/08/16   Sinus problem    Sore throat    Wears glasses    Wears glasses    reading glasses only    SURGICAL HISTORY: Past Surgical History:  Procedure Laterality Date   CARPAL TUNNEL RELEASE Right 02/02/2011   CARPAL TUNNEL RELEASE Left 01/22/2014   Procedure: LEFT CARPAL TUNNEL RELEASE;  Surgeon: Nicki Reaper, MD;  Location: [facility];  Service: Orthopedics;  Laterality: Left;   CHOLECYSTECTOMY  08/06/2004   ESOPHAGOGASTRODUODENOSCOPY  03/14/2011   GASTRIC BYPASS  03/14/11   lap. Roux-en-Y   HYSTEROSCOPY WITH NOVASURE  10/15/2004   PLANTAR FASCIA RELEASE Left 08/25/2009   PLANTAR FASCIA RELEASE Right 01/02/2004   TRIGGER FINGER RELEASE Right 06/24/2014   Procedure: RELEASE A-1 PULLEY RIGHT RING FINGER;  Surgeon: Cindee Salt, MD;  Location: [facility];  Service: Orthopedics;  Laterality: Right;  ANESTHESIA: IV REGIONAL FAB   TUBAL LIGATION  1999    SOCIAL HISTORY: Social History   Socioeconomic History   Marital status:  Married    Spouse name: Arlys Yong Grieser   Number of children: 2   Years of education: 14   Highest education level: Not on file  Occupational History   Not on file  Tobacco Use   Smoking status: Never   Smokeless tobacco: Never  Vaping Use   Vaping Use: Never used  Substance and Sexual Activity   Alcohol use: No   Drug use: No   Sexual activity: Never  Other Topics Concern   Not on file  Social History Narrative   Lives at home with husband   Caffeine use- sodas, 3 20 oz bottles daily   Social Determinants of Health   Financial Resource Strain: Not on file  Food Insecurity: Not on  file  Transportation Needs: Not on file  Physical Activity: Not on file  Stress: Not on file  Social Connections: Not on file  Intimate Partner Violence: Not on file    FAMILY HISTORY: Family History  Problem Relation Age of Onset   Gout Father    Cancer Maternal Grandmother        breast   Breast cancer Maternal Grandmother    Breast cancer Mother    Stroke Paternal Grandmother    Dementia Paternal Grandmother     ALLERGIES:  is allergic to cephalexin, cephalosporins, codeine, morphine and related, other, pentazocine lactate, prednisone, rocephin [ceftriaxone sodium in dextrose], tegretol [carbamazepine], and tramadol.  MEDICATIONS:  Current Outpatient Medications  Medication Sig Dispense Refill   acetaminophen (TYLENOL) 500 MG tablet Take 500-1,000 mg by mouth every 6 (six) hours as needed for moderate pain.     ALPRAZolam (XANAX) 0.25 MG tablet      diphenhydrAMINE (BENADRYL) 25 mg capsule Take 1 capsule by mouth daily. Alternates w/ Zyrtec     divalproex (DEPAKOTE) 250 MG DR tablet Take 1 tablet (250 mg total) by mouth 2 (two) times daily. 180 tablet 1   levETIRAcetam (KEPPRA) 1000 MG tablet Take 1 tablet (1,000 mg total) by mouth 2 (two) times daily. 180 tablet 2   zolpidem (AMBIEN) 10 MG tablet Take 10 mg by mouth daily as needed for sleep.     No current facility-administered medications for this visit.    REVIEW OF SYSTEMS:   Constitutional: ( - ) fevers, ( - )  chills , ( - ) night sweats Eyes: ( - ) blurriness of vision, ( - ) double vision, ( - ) watery eyes Ears, nose, mouth, throat, and face: ( - ) mucositis, ( - ) sore throat Respiratory: ( - ) cough, ( - ) dyspnea, ( - ) wheezes Cardiovascular: ( - ) palpitation, ( - ) chest discomfort, ( - ) lower extremity swelling Gastrointestinal:  ( - ) nausea, ( - ) heartburn, ( - ) change in bowel habits Skin: ( - ) abnormal skin rashes Lymphatics: ( - ) new lymphadenopathy, ( - ) easy bruising Neurological: ( - )  numbness, ( - ) tingling, ( - ) new weaknesses Behavioral/Psych: ( - ) mood change, ( - ) new changes  All other systems were reviewed with the patient and are negative.  PHYSICAL EXAMINATION:  Vitals:   12/21/22 1139  BP: 121/75  Pulse: 84  Resp: 16  Temp: 97.7 F (36.5 C)  SpO2: 95%    Filed Weights   12/21/22 1139  Weight: 154 lb 6.4 oz (70 kg)   GENERAL: Well-appearing middle-age Caucasian female, alert, no distress and comfortable SKIN: skin color, texture, turgor are normal, no rashes or  significant lesions EYES: conjunctiva are pink and non-injected, sclera clear LUNGS: clear to auscultation and percussion with normal breathing effort HEART: regular rate & rhythm and no murmurs and no lower extremity edema Musculoskeletal: no cyanosis of digits and no clubbing  PSYCH: alert & oriented x 3, fluent speech NEURO: no focal motor/sensory deficits  LABORATORY DATA:  I have reviewed the data as listed    Latest Ref Rng & Units 12/21/2022   10:45 AM 10/20/2022    2:02 PM 07/20/2022    3:31 PM  CBC  WBC 4.0 - 10.5 K/uL 4.2  4.6  6.1   Hemoglobin 12.0 - 15.0 g/dL 40.9  81.1  91.4   Hematocrit 36.0 - 46.0 % 34.9  35.8  39.1   Platelets 150 - 400 K/uL 213  144  229        Latest Ref Rng & Units 12/21/2022   10:45 AM 07/20/2022    3:31 PM 06/22/2022   10:51 AM  CMP  Glucose 70 - 99 mg/dL 84  85  93   BUN 6 - 20 mg/dL Creatinine 0.44 - 1.00 mg/dL 7.82  9.56  2.13   Sodium 135 - 145 mmol/L 142  136  143   Potassium 3.5 - 5.1 mmol/L 3.8  4.4  3.8   Chloride 98 - 111 mmol/L 105  98  106   CO2 22 - 32 mmol/L 33  28  30   Calcium 8.9 - 10.3 mg/dL 9.2  9.6  9.4   Total Protein 6.5 - 8.1 g/dL 6.3   6.8   Total Bilirubin 0.3 - 1.2 mg/dL 0.3   0.3   Alkaline Phos 38 - 126 U/L 72   70   AST 15 - 41 U/L 10   14   ALT 0 - 44 U/L 8   10    RADIOGRAPHIC STUDIES: No results found.  ASSESSMENT & PLAN Nikiya Starn Kunst 47 y.o. female with medical history significant  for iron deficiency anemia 2/2 to gastric bypass who presents for a follow up visit.   After review of the labs, review of the records, and discussion with the patient the patients findings are most consistent with iron deficiency anemia secondary to gastric bypass.  At this time would recommend repletion of iron stores with IV iron therapy.  P.o. therapy is generally adequate gastric bypass patients as they do not appropriately absorb p.o. iron therapy.  Additionally they did not absorb iron from food as they should.  As such I would recommend we proceed with IV iron treatment.  Additionally we will check her for vitamin B12 deficiency which can also occur in patients with gastric bypass.  The patient and her husband voiced understanding of the plan moving forward.   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 --P.o. iron therapy will likely be ineffective in a patient with gastric bypass.  We will need to proceed with IV iron therapy if iron levels drop in the future.  --Labs today show white blood cell count 4.2, hemoglobin 11.7, MCV 93.3, and platelets of 213 --Plan for return to clinic in 6 months time with repeat labs.  No orders of the  defined types were placed in this encounter.   All questions were answered. The patient knows to call the clinic with any problems, questions or concerns.  A total of more than 30 minutes were spent on this encounter with face-to-face time and non-face-to-face time, including preparing to see the patient, ordering tests and/or medications, counseling the patient and coordination of care as outlined above.   Ulysees Barns, MD Department of  Hematology/Oncology [facility] Phone: [phone] Pager: [phone] Email: [email or web address]  12/21/2022 3:05 PM

## 2022-12-21 ENCOUNTER — Inpatient Hospital Stay (HOSPITAL_BASED_OUTPATIENT_CLINIC_OR_DEPARTMENT_OTHER): Payer: BC Managed Care – PPO | Admitting: Hematology and Oncology

## 2022-12-21 ENCOUNTER — Other Ambulatory Visit: Payer: Self-pay | Admitting: Hematology and Oncology

## 2022-12-21 ENCOUNTER — Inpatient Hospital Stay: Payer: BC Managed Care – PPO | Attending: Hematology and Oncology

## 2022-12-21 VITALS — BP 121/75 | HR 84 | Temp 97.7°F | Resp 16 | Wt 154.4 lb

## 2022-12-21 DIAGNOSIS — D508 Other iron deficiency anemias: Secondary | ICD-10-CM | POA: Insufficient documentation

## 2022-12-21 DIAGNOSIS — Z853 Personal history of malignant neoplasm of breast: Secondary | ICD-10-CM | POA: Diagnosis not present

## 2022-12-21 DIAGNOSIS — Z803 Family history of malignant neoplasm of breast: Secondary | ICD-10-CM | POA: Diagnosis not present

## 2022-12-21 DIAGNOSIS — Z9884 Bariatric surgery status: Secondary | ICD-10-CM | POA: Diagnosis not present

## 2022-12-21 LAB — CMP (CANCER CENTER ONLY)
ALT: 8 U/L (ref 0–44)
AST: 10 U/L — ABNORMAL LOW (ref 15–41)
Albumin: 3.8 g/dL (ref 3.5–5.0)
Alkaline Phosphatase: 72 U/L (ref 38–126)
Anion gap: 4 — ABNORMAL LOW (ref 5–15)
BUN: 8 mg/dL (ref 6–20)
CO2: 33 mmol/L — ABNORMAL HIGH (ref 22–32)
Calcium: 9.2 mg/dL (ref 8.9–10.3)
Chloride: 105 mmol/L (ref 98–111)
Creatinine: 0.78 mg/dL (ref 0.44–1.00)
GFR, Estimated: >60 mL/min (ref >60)
Glucose, Bld: 84 mg/dL (ref 70–99)
Potassium: 3.8 mmol/L (ref 3.5–5.1)
Sodium: 142 mmol/L (ref 135–145)
Total Bilirubin: 0.3 mg/dL (ref 0.3–1.2)
Total Protein: 6.3 g/dL — ABNORMAL LOW (ref 6.5–8.1)

## 2022-12-21 LAB — CBC WITH DIFFERENTIAL (CANCER CENTER ONLY)
Abs Immature Granulocytes: 0.01 10*3/uL (ref 0.00–0.07)
Basophils Absolute: 0 10*3/uL (ref 0.0–0.1)
Basophils Relative: 1 %
Eosinophils Absolute: 0.2 10*3/uL (ref 0.0–0.5)
Eosinophils Relative: 4 %
HCT: 34.9 % — ABNORMAL LOW (ref 36.0–46.0)
Hemoglobin: 11.7 g/dL — ABNORMAL LOW (ref 12.0–15.0)
Immature Granulocytes: 0 %
Lymphocytes Relative: 40 %
Lymphs Abs: 1.7 10*3/uL (ref 0.7–4.0)
MCH: 31.3 pg (ref 26.0–34.0)
MCHC: 33.5 g/dL (ref 30.0–36.0)
MCV: 93.3 fL (ref 80.0–100.0)
Monocytes Absolute: 0.4 10*3/uL (ref 0.1–1.0)
Monocytes Relative: 10 %
Neutro Abs: 1.9 10*3/uL (ref 1.7–7.7)
Neutrophils Relative %: 45 %
Platelet Count: 213 10*3/uL (ref 150–400)
RBC: 3.74 MIL/uL — ABNORMAL LOW (ref 3.87–5.11)
RDW: 12.5 % (ref 11.5–15.5)
WBC Count: 4.2 10*3/uL (ref 4.0–10.5)
nRBC: 0 % (ref 0.0–0.2)

## 2022-12-21 LAB — IRON AND IRON BINDING CAPACITY (CC-WL,HP ONLY)
Iron: 111 ug/dL (ref 28–170)
Saturation Ratios: 39 % — ABNORMAL HIGH (ref 10.4–31.8)
TIBC: 283 ug/dL (ref 250–450)
UIBC: 172 ug/dL (ref 148–442)

## 2022-12-21 LAB — FERRITIN: Ferritin: 59 ng/mL (ref 11–307)

## 2022-12-21 LAB — RETIC PANEL
Immature Retic Fract: 4.8 % (ref 2.3–15.9)
RBC.: 3.76 MIL/uL — ABNORMAL LOW (ref 3.87–5.11)
Retic Count, Absolute: 51.9 10*3/uL (ref 19.0–186.0)
Retic Ct Pct: 1.4 % (ref 0.4–3.1)
Reticulocyte Hemoglobin: 33.6 pg (ref >27.9)

## 2023-02-06 ENCOUNTER — Other Ambulatory Visit: Payer: Self-pay

## 2023-02-06 ENCOUNTER — Emergency Department (HOSPITAL_BASED_OUTPATIENT_CLINIC_OR_DEPARTMENT_OTHER)
Admission: EM | Admit: 2023-02-06 | Discharge: 2023-02-06 | Disposition: A | Discharge status: Home / Self Care | Payer: BC Managed Care – PPO | Attending: Emergency Medicine | Admitting: Emergency Medicine

## 2023-02-06 ENCOUNTER — Emergency Department (HOSPITAL_BASED_OUTPATIENT_CLINIC_OR_DEPARTMENT_OTHER): Payer: BC Managed Care – PPO

## 2023-02-06 ENCOUNTER — Encounter (HOSPITAL_BASED_OUTPATIENT_CLINIC_OR_DEPARTMENT_OTHER): Payer: Self-pay | Admitting: Emergency Medicine

## 2023-02-06 ENCOUNTER — Ambulatory Visit: Payer: BC Managed Care – PPO | Admitting: Family Medicine

## 2023-02-06 DIAGNOSIS — S022XXA Fracture of nasal bones, initial encounter for closed fracture: Secondary | ICD-10-CM | POA: Insufficient documentation

## 2023-02-06 DIAGNOSIS — S0083XA Contusion of other part of head, initial encounter: Secondary | ICD-10-CM | POA: Diagnosis not present

## 2023-02-06 DIAGNOSIS — W01198A Fall on same level from slipping, tripping and stumbling with subsequent striking against other object, initial encounter: Secondary | ICD-10-CM | POA: Diagnosis not present

## 2023-02-06 DIAGNOSIS — W19XXXA Unspecified fall, initial encounter: Secondary | ICD-10-CM

## 2023-02-06 DIAGNOSIS — S0992XA Unspecified injury of nose, initial encounter: Secondary | ICD-10-CM | POA: Diagnosis present

## 2023-02-06 LAB — CBC
HCT: 34.6 % — ABNORMAL LOW (ref 36.0–46.0)
Hemoglobin: 11.5 g/dL — ABNORMAL LOW (ref 12.0–15.0)
MCH: 30.8 pg (ref 26.0–34.0)
MCHC: 33.2 g/dL (ref 30.0–36.0)
MCV: 92.8 fL (ref 80.0–100.0)
Platelets: 216 10*3/uL (ref 150–400)
RBC: 3.73 MIL/uL — ABNORMAL LOW (ref 3.87–5.11)
RDW: 12.7 % (ref 11.5–15.5)
WBC: 6.2 10*3/uL (ref 4.0–10.5)
nRBC: 0 % (ref 0.0–0.2)

## 2023-02-06 LAB — BASIC METABOLIC PANEL
Anion gap: 8 (ref 5–15)
BUN: 9 mg/dL (ref 6–20)
CO2: 28 mmol/L (ref 22–32)
Calcium: 8.9 mg/dL (ref 8.9–10.3)
Chloride: 103 mmol/L (ref 98–111)
Creatinine, Ser: 0.83 mg/dL (ref 0.44–1.00)
GFR, Estimated: >60 mL/min (ref >60)
Glucose, Bld: 96 mg/dL (ref 70–99)
Potassium: 3.9 mmol/L (ref 3.5–5.1)
Sodium: 139 mmol/L (ref 135–145)

## 2023-02-06 NOTE — ED Provider Notes (Signed)
[place] [facility] AT [facility] Provider Note   CSN: [id number] Arrival date & time: 02/06/23  1811     History  Chief Complaint  Patient presents with   [name] is a 47 y.o. female.  47 year old female presents emergency room after trip and fall today.  Patient states that she tripped over her son's shoes and fell face first into the corner of the couch, striking her nose on the wood corner resulting in significant nosebleed.  No loss of consciousness, is not anticoagulated.  Reports pain in her face, head, seeing spots.  States that she had a cervical/pelvic procedure a week ago, her blood pressures have been running high since that time.       Home Medications Prior to Admission medications   Medication Sig Start Date End Date Taking? Authorizing Provider  acetaminophen (TYLENOL) 500 MG tablet Take 500-1,000 mg by mouth every 6 (six) hours as needed for moderate pain.    [provider]  ALPRAZolam Prudy Feeler) 0.25 MG tablet  02/26/20   [provider]  diphenhydrAMINE (BENADRYL) 25 mg capsule Take 1 capsule by mouth daily. Alternates w/ Zyrtec    [provider]  divalproex (DEPAKOTE) 250 MG DR tablet Take 1 tablet (250 mg total) by mouth 2 (two) times daily. 08/15/22   Lomax, Amy, NP  levETIRAcetam (KEPPRA) 1000 MG tablet Take 1 tablet (1,000 mg total) by mouth 2 (two) times daily. 07/28/22   Lomax, Amy, NP  zolpidem (AMBIEN) 10 MG tablet Take 10 mg by mouth daily as needed for sleep.    [provider]  nebivolol (BYSTOLIC) 5 MG tablet Take 5 mg by mouth daily.    11/21/11  [provider]  Niacin-Simvastatin [facility]) 500-40 MG TB24 Take by mouth 1 day or 1 dose.    11/21/11  [provider]  omeprazole (PRILOSEC) 40 MG capsule Take 40 mg by mouth daily.    11/21/11  [provider]      Allergies    Cephalexin, Cephalosporins, Codeine, Morphine and codeine, Other, Pentazocine  lactate, Prednisone, Rocephin [ceftriaxone sodium in dextrose], Tegretol [carbamazepine], and Tramadol    Review of Systems   Review of Systems Negative except as per HPI Physical Exam Updated Vital Signs BP 120/71 (BP Location: Left Arm)   Pulse 91   Temp 98.8 F (37.1 C)   Resp 20   Ht 5\' 4"  (1.626 m)   Wt 67.6 kg   SpO2 98%   BMI 25.58 kg/m  Physical Exam Vitals and nursing note reviewed.  Constitutional:      General: She is not in acute distress.    Appearance: She is well-developed. She is not diaphoretic.  HENT:     Head: Normocephalic.      Mouth/Throat:     Mouth: Mucous membranes are moist.  Eyes:     Extraocular Movements: Extraocular movements intact.     Pupils: Pupils are equal, round, and reactive to light.  Pulmonary:     Effort: Pulmonary effort is normal.  Musculoskeletal:     Cervical back: Normal range of motion and neck supple. No tenderness.  Skin:    General: Skin is warm and dry.     Findings: No erythema or rash.  Neurological:     Mental Status: She is alert and oriented to person, place, and time.     Cranial Nerves: No cranial nerve deficit.     Motor: No weakness.  Gait: Gait normal.  Psychiatric:        Behavior: Behavior normal.     ED Results / Procedures / Treatments   Labs (all labs ordered are listed, but only abnormal results are displayed) Labs Reviewed  CBC - Abnormal; Notable for the following components:      Result Value   RBC 3.73 (*)    Hemoglobin 11.5 (*)    HCT 34.6 (*)    All other components within normal limits  BASIC METABOLIC PANEL    EKG None  Radiology CT Head Wo Contrast  Result Date: 02/06/2023 CLINICAL DATA:  Blunt facial trauma EXAM: CT HEAD WITHOUT CONTRAST CT MAXILLOFACIAL WITHOUT CONTRAST TECHNIQUE: Multidetector CT imaging of the head and maxillofacial structures were performed using the standard protocol without intravenous contrast. Multiplanar CT image reconstructions of the  maxillofacial structures were also generated. RADIATION DOSE REDUCTION: This exam was performed according to the departmental dose-optimization program which includes automated exposure control, adjustment of the mA and/or kV according to patient size and/or use of iterative reconstruction technique. COMPARISON:  None Available. FINDINGS: CT HEAD FINDINGS Brain: No evidence of acute infarction, hemorrhage, hydrocephalus, extra-axial collection or mass lesion/mass effect. Vascular: No hyperdense vessel or unexpected calcification. Skull: Normal. Negative for fracture or focal lesion. Other: Mastoid air cells and middle ear cavities are clear CT MAXILLOFACIAL FINDINGS Osseous: There are acute minimally angulated fractures of the nasal bones bilaterally no other facial fracture identified. No mandibular dislocation. Orbits: Negative. No traumatic or inflammatory finding. Sinuses: Clear. Soft tissues: There is minimal soft tissue swelling superficial to the nasal bridge. The facial soft tissues are otherwise unremarkable. IMPRESSION: 1. No acute intracranial pathology. 2. Acute minimally angulated fractures of the nasal bones bilaterally. Electronically Signed   By: Helyn Numbers M.D.   On: 02/06/2023 19:56   CT Maxillofacial WO CM  Result Date: 02/06/2023 CLINICAL DATA:  Blunt facial trauma EXAM: CT HEAD WITHOUT CONTRAST CT MAXILLOFACIAL WITHOUT CONTRAST TECHNIQUE: Multidetector CT imaging of the head and maxillofacial structures were performed using the standard protocol without intravenous contrast. Multiplanar CT image reconstructions of the maxillofacial structures were also generated. RADIATION DOSE REDUCTION: This exam was performed according to the departmental dose-optimization program which includes automated exposure control, adjustment of the mA and/or kV according to patient size and/or use of iterative reconstruction technique. COMPARISON:  None Available. FINDINGS: CT HEAD FINDINGS Brain: No evidence  of acute infarction, hemorrhage, hydrocephalus, extra-axial collection or mass lesion/mass effect. Vascular: No hyperdense vessel or unexpected calcification. Skull: Normal. Negative for fracture or focal lesion. Other: Mastoid air cells and middle ear cavities are clear CT MAXILLOFACIAL FINDINGS Osseous: There are acute minimally angulated fractures of the nasal bones bilaterally no other facial fracture identified. No mandibular dislocation. Orbits: Negative. No traumatic or inflammatory finding. Sinuses: Clear. Soft tissues: There is minimal soft tissue swelling superficial to the nasal bridge. The facial soft tissues are otherwise unremarkable. IMPRESSION: 1. No acute intracranial pathology. 2. Acute minimally angulated fractures of the nasal bones bilaterally. Electronically Signed   By: Helyn Numbers M.D.   On: 02/06/2023 19:56    Procedures Procedures    Medications Ordered in ED Medications - No data to display  ED Course/ Medical Decision Making/ A&P                             Medical Decision Making Amount and/or Complexity of Data Reviewed Labs: ordered. Radiology: ordered.   This patient  presents to the ED for concern of head injury, facial fractures after fall today, this involves an extensive number of treatment options, and is a complaint that carries with it a high risk of complications and morbidity.  The differential diagnosis includes but not limited to intracranial injury, skull fracture, nasal bone fracture, facial fractures, anemia   Co morbidities that complicate the patient evaluation  Hyperlipidemia, headaches, GERD, hypertension, seizures   Additional history obtained:  External records from outside source obtained and reviewed including prior labs on file for comparison   Lab Tests:  I Ordered, and personally interpreted labs.  The pertinent results include: CBC with hemoglobin 11.5, not significantly changed from prior.  BMP normal.   Imaging Studies  ordered:  I ordered imaging studies including CT head, CT maxillofacial I independently visualized and interpreted imaging which showed nasal bone fractures I agree with the radiologist interpretation    Problem List / ED Course / Critical interventions / Medication management  47 year old female presents for evaluation after trip and fall hitting the corner of her sofa with her face.  No LOC, does have pain in her nose, found to have bruising and tenderness along the nose, extraocular movements intact, pupils equal and reactive, gait intact, neuroexam normal.  CBC does not show significant blood loss.  CT head and maxillofacial reveals mild nasal bone fractures.  There is no septal hematoma on exam.  Patient is discharged with referral to ENT.  Can apply ice to face to help with pain and swelling, Motrin Tylenol as needed as directed for pain and swelling. I have reviewed the patients home medicines and have made adjustments as needed   Social Determinants of Health:  Lives with family   Test / Admission - Considered:  Stable for discharge         Final Clinical Impression(s) / ED Diagnoses Final diagnoses:  Fall, initial encounter  Closed fracture of nasal bone, initial encounter    Rx / DC Orders ED Discharge Orders     None         Jeannie Fend, PA-C 02/06/23 2014    Virgina Norfolk, DO 02/06/23 2251

## 2023-02-06 NOTE — ED Triage Notes (Signed)
Patient arrives ambulatory by POV states this am she slipped on her sons shoe this morning causing her to trip and hit face on side of couch that is wood. Caused her to have nosebleed.  Reports having a cervical procedure last Monday. C/o pain to head and nose and having dizziness since fall.

## 2023-02-06 NOTE — Discharge Instructions (Signed)
Home to rest. Ice to face to help with pain and swelling. Motrin and Tylenol as needed as directed. Follow up with ENT. Return to ER for worsening or concerning symptoms.

## 2023-02-13 NOTE — Patient Instructions (Signed)
Below is our plan:  We will continue phenytoin 300mg twice daily and divalproex 1000mg three times daily.   Please make sure you are consistent with timing of seizure medication. I recommend annual visit with primary care provider (PCP) for complete physical and routine blood work. I recommend daily intake of vitamin D (400-800iu) and calcium (800-1000mg) for bone health. Discuss Dexa screening with PCP.   According to [place] law, you can not drive unless you are seizure / syncope free for at least 6 months and under physician's care.  Please maintain precautions. Do not participate in activities where a loss of awareness could harm you or someone else. No swimming alone, no tub bathing, no hot tubs, no driving, no operating motorized vehicles (cars, ATVs, motocycles, etc), lawnmowers, power tools or firearms. No standing at heights, such as rooftops, ladders or stairs. Avoid hot objects such as stoves, heaters, open fires. Wear a helmet when riding a bicycle, scooter, skateboard, etc. and avoid areas of traffic. Set your water heater to 120 degrees or less.  Please make sure you are staying well hydrated. I recommend 50-60 ounces daily. Well balanced diet and regular exercise encouraged. Consistent sleep schedule with 6-8 hours recommended.   Please continue follow up with care team as directed.   Follow up with me in 1 year   You may receive a survey regarding today's visit. I encourage you to leave honest feed back as I do use this information to improve patient care. Thank you for seeing me today!    
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
## 2023-02-14 ENCOUNTER — Encounter: Payer: Self-pay | Admitting: Family Medicine

## 2023-02-14 ENCOUNTER — Ambulatory Visit (INDEPENDENT_AMBULATORY_CARE_PROVIDER_SITE_OTHER): Payer: BC Managed Care – PPO | Admitting: Family Medicine

## 2023-02-14 DIAGNOSIS — G40909 Epilepsy, unspecified, not intractable, without status epilepticus: Secondary | ICD-10-CM | POA: Diagnosis not present

## 2023-02-14 MED ORDER — LEVETIRACETAM 1000 MG PO TABS
1000.0000 mg | ORAL_TABLET | Freq: Two times a day (BID) | ORAL | 3 refills | Status: AC
Start: 2023-02-14 — End: ?

## 2023-02-14 MED ORDER — DIVALPROEX SODIUM 250 MG PO DR TAB: 250.0000 mg | DELAYED_RELEASE_TABLET | Freq: Two times a day (BID) | ORAL | 3 refills | Status: DC

## 2023-02-15 LAB — LEVETIRACETAM LEVEL: Levetiracetam Lvl: 52.8 ug/mL — ABNORMAL HIGH (ref 10.0–40.0)

## 2023-02-15 LAB — VALPROIC ACID LEVEL: Valproic Acid Lvl: 45 ug/mL — ABNORMAL LOW (ref 50–100)

## 2023-06-15 ENCOUNTER — Inpatient Hospital Stay: Payer: BC Managed Care – PPO

## 2023-06-21 ENCOUNTER — Inpatient Hospital Stay: Payer: BC Managed Care – PPO | Attending: Internal Medicine

## 2023-06-21 ENCOUNTER — Other Ambulatory Visit: Payer: Self-pay | Admitting: Hematology and Oncology

## 2023-06-21 DIAGNOSIS — D508 Other iron deficiency anemias: Secondary | ICD-10-CM

## 2023-06-21 DIAGNOSIS — Z9884 Bariatric surgery status: Secondary | ICD-10-CM | POA: Diagnosis not present

## 2023-06-21 LAB — IRON AND IRON BINDING CAPACITY (CC-WL,HP ONLY)
Iron: 56 ug/dL (ref 28–170)
Saturation Ratios: 16 % (ref 10.4–31.8)
TIBC: 353 ug/dL (ref 250–450)
UIBC: 297 ug/dL

## 2023-06-21 LAB — RETIC PANEL
Immature Retic Fract: 12.6 % (ref 2.3–15.9)
RBC.: 3.7 MIL/uL — ABNORMAL LOW (ref 3.87–5.11)
Retic Count, Absolute: 60.3 10*3/uL (ref 19.0–186.0)
Retic Ct Pct: 1.6 % (ref 0.4–3.1)
Reticulocyte Hemoglobin: 35.2 pg (ref >27.9)

## 2023-06-21 LAB — CBC WITH DIFFERENTIAL (CANCER CENTER ONLY)
Abs Immature Granulocytes: 0.01 10*3/uL (ref 0.00–0.07)
Basophils Absolute: 0 10*3/uL (ref 0.0–0.1)
Basophils Relative: 0 %
Eosinophils Absolute: 0.2 10*3/uL (ref 0.0–0.5)
Eosinophils Relative: 3 %
HCT: 35.4 % — ABNORMAL LOW (ref 36.0–46.0)
Hemoglobin: 11.7 g/dL — ABNORMAL LOW (ref 12.0–15.0)
Immature Granulocytes: 0 %
Lymphocytes Relative: 32 %
Lymphs Abs: 1.6 10*3/uL (ref 0.7–4.0)
MCH: 31.1 pg (ref 26.0–34.0)
MCHC: 33.1 g/dL (ref 30.0–36.0)
MCV: 94.1 fL (ref 80.0–100.0)
Monocytes Absolute: 0.4 10*3/uL (ref 0.1–1.0)
Monocytes Relative: 7 %
Neutro Abs: 3 10*3/uL (ref 1.7–7.7)
Neutrophils Relative %: 58 %
Platelet Count: 204 10*3/uL (ref 150–400)
RBC: 3.76 MIL/uL — ABNORMAL LOW (ref 3.87–5.11)
RDW: 13.2 % (ref 11.5–15.5)
WBC Count: 5.1 10*3/uL (ref 4.0–10.5)
nRBC: 0 % (ref 0.0–0.2)

## 2023-06-21 LAB — CMP (CANCER CENTER ONLY)
ALT: 17 U/L (ref 0–44)
AST: 16 U/L (ref 15–41)
Albumin: 3.8 g/dL (ref 3.5–5.0)
Alkaline Phosphatase: 74 U/L (ref 38–126)
Anion gap: 8 (ref 5–15)
BUN: 7 mg/dL (ref 6–20)
CO2: 26 mmol/L (ref 22–32)
Calcium: 8.8 mg/dL — ABNORMAL LOW (ref 8.9–10.3)
Chloride: 105 mmol/L (ref 98–111)
Creatinine: 0.72 mg/dL (ref 0.44–1.00)
GFR, Estimated: >60 mL/min (ref >60)
Glucose, Bld: 97 mg/dL (ref 70–99)
Potassium: 3.5 mmol/L (ref 3.5–5.1)
Sodium: 139 mmol/L (ref 135–145)
Total Bilirubin: 0.3 mg/dL (ref 0.3–1.2)
Total Protein: 7 g/dL (ref 6.5–8.1)

## 2023-06-21 LAB — FOLATE: Folate: 7.2 ng/mL (ref >5.9)

## 2023-06-21 LAB — VITAMIN B12: Vitamin B-12: 265 pg/mL (ref 180–914)

## 2023-06-22 ENCOUNTER — Ambulatory Visit: Payer: BC Managed Care – PPO | Admitting: Hematology and Oncology

## 2023-06-22 LAB — FERRITIN: Ferritin: 26 ng/mL (ref 11–307)

## 2023-06-24 LAB — METHYLMALONIC ACID, SERUM: Methylmalonic Acid, Quantitative: 279 nmol/L (ref 0–378)

## 2023-06-28 ENCOUNTER — Inpatient Hospital Stay: Payer: BC Managed Care – PPO | Admitting: Hematology and Oncology

## 2023-06-28 VITALS — BP 129/81 | HR 95 | Temp 98.9°F | Resp 17 | Wt 152.0 lb

## 2023-06-28 DIAGNOSIS — D508 Other iron deficiency anemias: Secondary | ICD-10-CM | POA: Diagnosis not present

## 2023-06-28 NOTE — Progress Notes (Signed)
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 Leonides Schanz  05/09/2022: 1000 mg IV monoferric administered 06/22/2022: WBC 4.6, Hgb 12.5, MCV 89.4, Plt 192  Interval History:  [name] 47 y.o. female with medical history significant for iron deficiency anemia 2/2 to gastric bypass who presents for a follow up visit. The patient's last visit was on 12/21/2022. In the interim since the last visit she has had no major changes in her health.  On exam today [name] reports she has been well overall interim since her last visit.  She reports that she has had a few bouts over the last 2 months where her energy has been "so-so".  She reports that there are times where she feels like she "hits a wall".  She notes she underwent an ablation and has not had any menstrual cycles.  She has no upcoming planned surgeries or procedures.  She reports her appetite is good and she is eating well.  She does endorse having some occasional lightheadedness, dizziness, shortness of breath.  She is not having any fevers but is having some occasional chills and sweats.  She reports she is also prone to headaches.  She otherwise denies any fevers, chills, sweats, nausea, vomiting or diarrhea.  Full 10 point ROS was otherwise negative.  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   Hyperlipidemia    Hypertension    resolved 5 yrs ago   Incontinence    Lumbago    Plantar fasciitis    Seizures (HCC)    seizure 11/23/15 and mild seizure 02/08/16   Sinus problem    Sore throat    Wears glasses    Wears glasses    reading glasses only    SURGICAL HISTORY: Past Surgical History:  Procedure Laterality Date   CARPAL TUNNEL RELEASE Right 02/02/2011   CARPAL TUNNEL RELEASE Left 01/22/2014   Procedure: LEFT CARPAL TUNNEL RELEASE;  Surgeon: Nicki Reaper, MD;  Location: [facility];  Service: Orthopedics;  Laterality: Left;   CHOLECYSTECTOMY  08/06/2004   ESOPHAGOGASTRODUODENOSCOPY  03/14/2011   GASTRIC BYPASS  03/14/11   lap. Roux-en-Y   HYSTEROSCOPY WITH NOVASURE  10/15/2004   PLANTAR FASCIA RELEASE Left 08/25/2009   PLANTAR FASCIA RELEASE Right 01/02/2004   TRIGGER FINGER RELEASE Right 06/24/2014   Procedure: RELEASE A-1 PULLEY RIGHT RING FINGER;  Surgeon: Cindee Salt, MD;  Location: [facility];  Service: Orthopedics;  Laterality: Right;  ANESTHESIA: IV REGIONAL FAB   TUBAL LIGATION  1999    SOCIAL HISTORY: Social History   Socioeconomic History   Marital status: Married    Spouse name: Arlys Betty Brooks   Number of children: 2   Years of education: 14   Highest education level:  Not on file  Occupational History   Not on file  Tobacco Use   Smoking status: Never   Smokeless tobacco: Never  Vaping Use   Vaping status: Never Used  Substance and Sexual Activity   Alcohol use: No   Drug use: No   Sexual activity: Never  Other Topics Concern   Not on file  Social History Narrative   Lives at home with husband   Caffeine use- sodas, 3 20 oz bottles daily   Social Determinants of Health   Financial Resource Strain: Not on file  Food Insecurity: Low Risk  (02/13/2023)   [facility]   Hunger Vital Sign    Worried About Running Out of Food in the Last  Year: Never true    Ran Out of Food in the Last Year: Never true  Transportation Needs: No Transportation Needs (02/13/2023)   [facility]   Transportation    In the past 12 months, has lack of reliable transportation kept you from medical appointments, meetings, work or from getting things needed for daily living? : No  Physical Activity: Not on file  Stress: Not on file  Social Connections: Unknown (01/11/2022)   [facility]   Social Network    Social Network: Not on file  Intimate Partner Violence: Unknown (12/03/2021)   [facility] from [facility]   HITS    Physically Hurt: Not on file    Insult or Talk Down To: Not on file    Threaten Physical Harm: Not on file    Scream or Curse: Not on file    FAMILY HISTORY: Family History  Problem Relation Age of Onset   Gout Father    Cancer Maternal Grandmother        breast   Breast cancer Maternal Grandmother    Breast cancer Mother    Stroke Paternal Grandmother    Dementia Paternal Grandmother     ALLERGIES:  is allergic to cephalexin, cephalosporins, codeine, morphine and codeine, other, pentazocine lactate, prednisone, rocephin [ceftriaxone sodium in dextrose], tegretol [carbamazepine], and tramadol.  MEDICATIONS:  Current Outpatient Medications  Medication Sig Dispense Refill   acetaminophen (TYLENOL) 500 MG tablet Take 500-1,000 mg by mouth every 6 (six) hours as needed for moderate pain.     ALPRAZolam (XANAX) 0.25 MG tablet      diphenhydrAMINE (BENADRYL) 25 mg capsule Take 1 capsule by mouth daily. Alternates w/ Zyrtec     divalproex (DEPAKOTE) 250 MG DR tablet Take 1 tablet (250 mg total) by mouth 2 (two) times daily. 180 tablet 3   levETIRAcetam (KEPPRA) 1000 MG tablet Take 1 tablet (1,000 mg total) by mouth 2 (two) times daily. 180 tablet 3   zolpidem (AMBIEN) 10 MG tablet Take 10 mg by mouth daily as needed for sleep.     No current  facility-administered medications for this visit.    REVIEW OF SYSTEMS:   Constitutional: ( - ) fevers, ( - )  chills , ( - ) night sweats Eyes: ( - ) blurriness of vision, ( - ) double vision, ( - ) watery eyes Ears, nose, mouth, throat, and face: ( - ) mucositis, ( - ) sore throat Respiratory: ( - ) cough, ( - ) dyspnea, ( - ) wheezes Cardiovascular: ( - ) palpitation, ( - ) chest discomfort, ( - ) lower extremity swelling Gastrointestinal:  ( - ) nausea, ( - ) heartburn, ( - )  change in bowel habits Skin: ( - ) abnormal skin rashes Lymphatics: ( - ) new lymphadenopathy, ( - ) easy bruising Neurological: ( - ) numbness, ( - ) tingling, ( - ) new weaknesses Behavioral/Psych: ( - ) mood change, ( - ) new changes  All other systems were reviewed with the patient and are negative.  PHYSICAL EXAMINATION:  Vitals:   06/28/23 1420  BP: 129/81  Pulse: 95  Resp: 17  Temp: 98.9 F (37.2 C)  SpO2: 100%     Filed Weights   06/28/23 1420  Weight: 152 lb (68.9 kg)    GENERAL: Well-appearing middle-age Caucasian female, alert, no distress and comfortable SKIN: skin color, texture, turgor are normal, no rashes or significant lesions EYES: conjunctiva are pink and non-injected, sclera clear LUNGS: clear to auscultation and percussion with normal breathing effort HEART: regular rate & rhythm and no murmurs and no lower extremity edema Musculoskeletal: no cyanosis of digits and no clubbing  PSYCH: alert & oriented x 3, fluent speech NEURO: no focal motor/sensory deficits  LABORATORY DATA:  I have reviewed the data as listed    Latest Ref Rng & Units 06/21/2023    4:29 PM 02/06/2023    6:35 PM 12/21/2022   10:45 AM  CBC  WBC 4.0 - 10.5 K/uL 5.1  6.2  4.2   Hemoglobin 12.0 - 15.0 g/dL 16.1  09.6  04.5   Hematocrit 36.0 - 46.0 % 35.4  34.6  34.9   Platelets 150 - 400 K/uL 204  216  213        Latest Ref Rng & Units 06/21/2023    4:29 PM 02/06/2023    6:35 PM 12/21/2022   10:45 AM   CMP  Glucose 70 - 99 mg/dL 97  96  84   BUN 6 - 20 mg/dL 7  9  8    Creatinine 0.44 - 1.00 mg/dL 4.09  8.11  9.14   Sodium 135 - 145 mmol/L 139  139  142   Potassium 3.5 - 5.1 mmol/L 3.5  3.9  3.8   Chloride 98 - 111 mmol/L 105  103  105   CO2 22 - 32 mmol/L 26  28  33   Calcium 8.9 - 10.3 mg/dL 8.8  8.9  9.2   Total Protein 6.5 - 8.1 g/dL 7.0   6.3   Total Bilirubin 0.3 - 1.2 mg/dL 0.3   0.3   Alkaline Phos 38 - 126 U/L 74   72   AST 15 - 41 U/L 16   10   ALT 0 - 44 U/L 17   8    RADIOGRAPHIC STUDIES: No results found.  ASSESSMENT & PLAN [name] 47 y.o. female with medical history significant for iron deficiency anemia 2/2 to gastric bypass who presents for a follow up visit.   After review of the labs, review of the records, and discussion with the patient the patients findings are most consistent with iron deficiency anemia secondary to gastric bypass.  At this time would recommend repletion of iron stores with IV iron therapy.  P.o. therapy is generally adequate gastric bypass patients as they do not appropriately absorb p.o. iron therapy.  Additionally they did not absorb iron from food as they should.  As such I would recommend we proceed with IV iron treatment.  Additionally we will check her for vitamin B12 deficiency which can also occur in patients with gastric bypass.  The patient and her husband voiced understanding  of the plan moving forward.   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 --P.o. iron therapy will likely be ineffective in a patient with gastric bypass.  We will need to proceed with IV iron therapy due to recurrent iron def anemia today.  --Labs today  show white blood cell count 5.1, hemoglobin 1.7, MCV 94.1, platelets 204 --Ferritin was found to be 26 with iron sat of 16%.  Will plan to proceed with IV iron therapy at this time. --Plan for return to clinic in 3 months with repeat labs. .  No orders of the defined types were placed in this encounter.   All questions were answered. The patient knows to call the clinic with any problems, questions or concerns.  A total of more than 30 minutes were spent on this encounter with face-to-face time and non-face-to-face time, including preparing to see the patient, ordering tests and/or medications, counseling the patient and coordination of care as outlined above.   Ulysees Barns, MD Department of Hematology/Oncology [facility] Phone: [phone] Pager: [phone] Email: [email or web address]  07/02/2023 5:27 PM

## 2023-06-30 ENCOUNTER — Telehealth: Payer: Self-pay | Admitting: *Deleted

## 2023-06-30 ENCOUNTER — Encounter: Payer: Self-pay | Admitting: Hematology and Oncology

## 2023-06-30 NOTE — Telephone Encounter (Signed)
Received vm message from pt inquiring about her IV iron appts.  Dr. Leonides Schanz- Need orders placed for this. No orders per [place]

## 2023-07-02 ENCOUNTER — Encounter: Payer: Self-pay | Admitting: Hematology and Oncology

## 2023-07-03 ENCOUNTER — Telehealth: Payer: Self-pay | Admitting: Pharmacy Technician

## 2023-07-03 NOTE — Telephone Encounter (Signed)
Auth Submission: NO AUTH NEEDED Site of care: Site of care: [facility] Payer: [employer] [place] Medication & CPT/J Code(s) submitted: Monoferric (Ferrci derisomaltose) [phone] Route of submission (phone, fax, portal):  Phone # [phone] Fax # Auth type: Buy/Bill PB Units/visits requested: X1 Reference number: [id number] Approval from: 07/03/23 to 08/29/23

## 2023-07-12 ENCOUNTER — Ambulatory Visit: Payer: BC Managed Care – PPO

## 2023-07-12 VITALS — BP 93/63 | HR 79 | Temp 98.4°F | Resp 18 | Ht 62.0 in | Wt 152.8 lb

## 2023-07-12 DIAGNOSIS — D508 Other iron deficiency anemias: Secondary | ICD-10-CM

## 2023-07-12 DIAGNOSIS — Z9884 Bariatric surgery status: Secondary | ICD-10-CM

## 2023-07-12 MED ORDER — SODIUM CHLORIDE 0.9 % IV SOLN
1000.0000 mg | Freq: Once | INTRAVENOUS | Status: AC
  Administered 2023-07-12: 1000 mg via INTRAVENOUS
  Filled 2023-07-12: qty 10

## 2023-07-12 NOTE — Patient Instructions (Signed)

## 2023-07-12 NOTE — Progress Notes (Signed)
Diagnosis: Iron Deficiency Anemia  Provider:  Chilton Greathouse MD  Procedure: IV Infusion  IV Type: Peripheral, IV Location: L Antecubital  Monoferric (Ferric Derisomaltose), Dose: 1000 mg  Infusion Start Time: 1539  Infusion Stop Time: 1610  Post Infusion IV Care: Observation period completed and Peripheral IV Discontinued  Discharge: Condition: Good, Destination: Home . AVS Provided  Performed by:  Adriana Mccallum, RN

## 2023-09-20 ENCOUNTER — Inpatient Hospital Stay: Payer: BC Managed Care – PPO | Attending: Internal Medicine

## 2023-09-20 ENCOUNTER — Other Ambulatory Visit: Payer: Self-pay | Admitting: Hematology and Oncology

## 2023-09-20 DIAGNOSIS — Z888 Allergy status to other drugs, medicaments and biological substances status: Secondary | ICD-10-CM | POA: Insufficient documentation

## 2023-09-20 DIAGNOSIS — Z79899 Other long term (current) drug therapy: Secondary | ICD-10-CM | POA: Insufficient documentation

## 2023-09-20 DIAGNOSIS — R000 Tachycardia, unspecified: Secondary | ICD-10-CM | POA: Insufficient documentation

## 2023-09-20 DIAGNOSIS — R5383 Other fatigue: Secondary | ICD-10-CM | POA: Insufficient documentation

## 2023-09-20 DIAGNOSIS — Z8349 Family history of other endocrine, nutritional and metabolic diseases: Secondary | ICD-10-CM | POA: Insufficient documentation

## 2023-09-20 DIAGNOSIS — Z9049 Acquired absence of other specified parts of digestive tract: Secondary | ICD-10-CM | POA: Diagnosis not present

## 2023-09-20 DIAGNOSIS — Z818 Family history of other mental and behavioral disorders: Secondary | ICD-10-CM | POA: Insufficient documentation

## 2023-09-20 DIAGNOSIS — R531 Weakness: Secondary | ICD-10-CM | POA: Insufficient documentation

## 2023-09-20 DIAGNOSIS — R0602 Shortness of breath: Secondary | ICD-10-CM | POA: Diagnosis not present

## 2023-09-20 DIAGNOSIS — Z881 Allergy status to other antibiotic agents status: Secondary | ICD-10-CM | POA: Insufficient documentation

## 2023-09-20 DIAGNOSIS — R002 Palpitations: Secondary | ICD-10-CM | POA: Diagnosis not present

## 2023-09-20 DIAGNOSIS — R110 Nausea: Secondary | ICD-10-CM | POA: Insufficient documentation

## 2023-09-20 DIAGNOSIS — Z9884 Bariatric surgery status: Secondary | ICD-10-CM | POA: Diagnosis not present

## 2023-09-20 DIAGNOSIS — Z803 Family history of malignant neoplasm of breast: Secondary | ICD-10-CM | POA: Diagnosis not present

## 2023-09-20 DIAGNOSIS — D508 Other iron deficiency anemias: Secondary | ICD-10-CM

## 2023-09-20 DIAGNOSIS — Z823 Family history of stroke: Secondary | ICD-10-CM | POA: Diagnosis not present

## 2023-09-20 DIAGNOSIS — M2550 Pain in unspecified joint: Secondary | ICD-10-CM | POA: Diagnosis not present

## 2023-09-20 DIAGNOSIS — Z885 Allergy status to narcotic agent status: Secondary | ICD-10-CM | POA: Diagnosis not present

## 2023-09-20 LAB — IRON AND IRON BINDING CAPACITY (CC-WL,HP ONLY)
Iron: 101 ug/dL (ref 28–170)
Saturation Ratios: 34 % — ABNORMAL HIGH (ref 10.4–31.8)
TIBC: 298 ug/dL (ref 250–450)
UIBC: 197 ug/dL

## 2023-09-20 LAB — CMP (CANCER CENTER ONLY)
ALT: 41 U/L (ref 0–44)
AST: 33 U/L (ref 15–41)
Albumin: 4.1 g/dL (ref 3.5–5.0)
Alkaline Phosphatase: 89 U/L (ref 38–126)
Anion gap: 5 (ref 5–15)
BUN: 13 mg/dL (ref 6–20)
CO2: 29 mmol/L (ref 22–32)
Calcium: 9.3 mg/dL (ref 8.9–10.3)
Chloride: 103 mmol/L (ref 98–111)
Creatinine: 0.89 mg/dL (ref 0.44–1.00)
GFR, Estimated: >60 mL/min (ref >60)
Glucose, Bld: 85 mg/dL (ref 70–99)
Potassium: 4.2 mmol/L (ref 3.5–5.1)
Sodium: 137 mmol/L (ref 135–145)
Total Bilirubin: 0.3 mg/dL (ref 0.0–1.2)
Total Protein: 6.8 g/dL (ref 6.5–8.1)

## 2023-09-20 LAB — CBC WITH DIFFERENTIAL (CANCER CENTER ONLY)
Abs Immature Granulocytes: 0.02 10*3/uL (ref 0.00–0.07)
Basophils Absolute: 0.1 10*3/uL (ref 0.0–0.1)
Basophils Relative: 1 %
Eosinophils Absolute: 0.2 10*3/uL (ref 0.0–0.5)
Eosinophils Relative: 4 %
HCT: 40.6 % (ref 36.0–46.0)
Hemoglobin: 13.3 g/dL (ref 12.0–15.0)
Immature Granulocytes: 0 %
Lymphocytes Relative: 36 %
Lymphs Abs: 2 10*3/uL (ref 0.7–4.0)
MCH: 32 pg (ref 26.0–34.0)
MCHC: 32.8 g/dL (ref 30.0–36.0)
MCV: 97.6 fL (ref 80.0–100.0)
Monocytes Absolute: 0.4 10*3/uL (ref 0.1–1.0)
Monocytes Relative: 8 %
Neutro Abs: 2.8 10*3/uL (ref 1.7–7.7)
Neutrophils Relative %: 51 %
Platelet Count: 247 10*3/uL (ref 150–400)
RBC: 4.16 MIL/uL (ref 3.87–5.11)
RDW: 13.7 % (ref 11.5–15.5)
WBC Count: 5.4 10*3/uL (ref 4.0–10.5)
nRBC: 0 % (ref 0.0–0.2)

## 2023-09-20 LAB — VITAMIN B12: Vitamin B-12: 504 pg/mL (ref 180–914)

## 2023-09-20 LAB — RETIC PANEL
Immature Retic Fract: 7.5 % (ref 2.3–15.9)
RBC.: 4.05 MIL/uL (ref 3.87–5.11)
Retic Count, Absolute: 55.1 10*3/uL (ref 19.0–186.0)
Retic Ct Pct: 1.4 % (ref 0.4–3.1)
Reticulocyte Hemoglobin: 38.4 pg (ref >27.9)

## 2023-09-21 LAB — FERRITIN: Ferritin: 325 ng/mL — ABNORMAL HIGH (ref 11–307)

## 2023-09-26 NOTE — Progress Notes (Unsigned)
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 Leonides Schanz  05/09/2022: 1000 mg IV monoferric administered 06/22/2022: WBC 4.6, Hgb 12.5, MCV 89.4, Plt 192 07/12/2023: 1000 mg IV monoferric administered  Interval History:  [name] 48 y.o. female with medical history significant for iron deficiency anemia 2/2 to gastric bypass who presents for a follow up visit. The patient's last visit was on 06/28/2023. In the interim since the last visit she has had no major changes in her health.  On exam today [name] reports she tolerated the IV iron infusion well in November" her color came back".  She reports that she continues to have extreme fatigue and weakness.  She reports she also has some occasional bouts of joint pain as well as abdominal discomfort.  She notes that she does have occasional palpitations and feelings of heart racing.  She reports that she has not been having any overt signs of bleeding, bruising, or dark stools.  She is having lightheadedness, dizziness, and occasional shortness of breath but no falls.  She is able to complete her day-to-day activities.  She has been having some occasional bouts of nausea as well for which she has been taking Dramamine.  She notes that she is eating a regular diet and is doing her best to try to eat balanced.  She has had no recent infectious symptoms such as runny nose, sore throat, or cough.  She denies any fevers, chills, sweats.  Full 10 point ROS was otherwise  negative.  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   Hyperlipidemia    Hypertension    resolved 5 yrs ago   Incontinence    Lumbago    Plantar fasciitis    Seizures (HCC)    seizure 11/23/15 and mild seizure 02/08/16   Sinus problem    Sore throat    Wears glasses    Wears glasses    reading glasses only    SURGICAL HISTORY: Past Surgical History:  Procedure Laterality Date   CARPAL TUNNEL RELEASE Right 02/02/2011   CARPAL TUNNEL RELEASE Left 01/22/2014   Procedure: LEFT CARPAL TUNNEL RELEASE;  Surgeon: Nicki Reaper, MD;  Location: [facility];  Service: Orthopedics;  Laterality: Left;   CHOLECYSTECTOMY  08/06/2004   ESOPHAGOGASTRODUODENOSCOPY  03/14/2011   GASTRIC BYPASS  03/14/11   lap. Roux-en-Y   HYSTEROSCOPY WITH NOVASURE  10/15/2004   PLANTAR FASCIA RELEASE Left 08/25/2009   PLANTAR FASCIA RELEASE Right 01/02/2004   TRIGGER FINGER RELEASE Right 06/24/2014   Procedure: RELEASE A-1 PULLEY RIGHT RING FINGER;  Surgeon: Cindee Salt, MD;  Location: [facility];  Service: Orthopedics;  Laterality: Right;  ANESTHESIA: IV REGIONAL FAB   TUBAL LIGATION  1999  SOCIAL HISTORY: Social History   Socioeconomic History   Marital status: Married    Spouse name: Arlys Devyne Hauger   Number of children: 2   Years of education: 14   Highest education level: Not on file  Occupational History   Not on file  Tobacco Use   Smoking status: Never   Smokeless tobacco: Never  Vaping Use   Vaping status: Never Used  Substance and Sexual Activity   Alcohol use: No   Drug use: No   Sexual activity: Never  Other Topics Concern   Not on file  Social History Narrative   Lives at home with husband   Caffeine use- sodas, 3 20 oz bottles daily    Social Drivers of Corporate investment banker Strain: Not on file  Food Insecurity: Low Risk  (02/13/2023)   [facility]   Hunger Vital Sign    Worried About Running Out of Food in the Last Year: Never true    Ran Out of Food in the Last Year: Never true  Transportation Needs: No Transportation Needs (02/13/2023)   [facility]   Transportation    In the past 12 months, has lack of reliable transportation kept you from medical appointments, meetings, work or from getting things needed for daily living? : No  Physical Activity: Not on file  Stress: Not on file  Social Connections: Unknown (01/11/2022)   [facility]   Social Network    Social Network: Not on file  Intimate Partner Violence: Unknown (12/03/2021)   [facility]   HITS    Physically Hurt: Not on file    Insult or Talk Down To: Not on file    Threaten Physical Harm: Not on file    Scream or Curse: Not on file    FAMILY HISTORY: Family History  Problem Relation Age of Onset   Gout Father    Cancer Maternal Grandmother        breast   Breast cancer Maternal Grandmother    Breast cancer Mother    Stroke Paternal Grandmother    Dementia Paternal Grandmother     ALLERGIES:  is allergic to cephalexin, cephalosporins, codeine, morphine and codeine, other, pentazocine lactate, prednisone, rocephin [ceftriaxone sodium in dextrose], tegretol [carbamazepine], and tramadol.  MEDICATIONS:  Current Outpatient Medications  Medication Sig Dispense Refill   acetaminophen (TYLENOL) 500 MG tablet Take 500-1,000 mg by mouth every 6 (six) hours as needed for moderate pain.     ALPRAZolam (XANAX) 0.25 MG tablet      diphenhydrAMINE (BENADRYL) 25 mg capsule Take 1 capsule by mouth daily. Alternates w/ Zyrtec     divalproex (DEPAKOTE) 250 MG DR tablet Take 1 tablet (250 mg total) by mouth 2 (two) times daily. 180  tablet 3   levETIRAcetam (KEPPRA) 1000 MG tablet Take 1 tablet (1,000 mg total) by mouth 2 (two) times daily. 180 tablet 3   zolpidem (AMBIEN) 10 MG tablet Take 10 mg by mouth daily as needed for sleep.     No current facility-administered medications for this visit.    REVIEW OF SYSTEMS:   Constitutional: ( - ) fevers, ( - )  chills , ( - ) night sweats Eyes: ( - ) blurriness of vision, ( - ) double vision, ( - ) watery eyes Ears, nose, mouth, throat, and face: ( - ) mucositis, ( - ) sore throat Respiratory: ( - ) cough, ( - )  dyspnea, ( - ) wheezes Cardiovascular: ( - ) palpitation, ( - ) chest discomfort, ( - ) lower extremity swelling Gastrointestinal:  ( - ) nausea, ( - ) heartburn, ( - ) change in bowel habits Skin: ( - ) abnormal skin rashes Lymphatics: ( - ) new lymphadenopathy, ( - ) easy bruising Neurological: ( - ) numbness, ( - ) tingling, ( - ) new weaknesses Behavioral/Psych: ( - ) mood change, ( - ) new changes  All other systems were reviewed with the patient and are negative.  PHYSICAL EXAMINATION:  Vitals:   09/27/23 0918  BP: 112/77  Pulse: (!) 116  Resp: 18  Temp: (!) 97.3 F (36.3 C)  SpO2: 100%      Filed Weights   09/27/23 0918  Weight: 151 lb 6.4 oz (68.7 kg)    GENERAL: Well-appearing middle-age Caucasian female, alert, no distress and comfortable SKIN: skin color, texture, turgor are normal, no rashes or significant lesions EYES: conjunctiva are pink and non-injected, sclera clear LUNGS: clear to auscultation and percussion with normal breathing effort HEART: regular rate & rhythm and no murmurs and no lower extremity edema Musculoskeletal: no cyanosis of digits and no clubbing  PSYCH: alert & oriented x 3, fluent speech NEURO: no focal motor/sensory deficits  LABORATORY DATA:  I have reviewed the data as listed    Latest Ref Rng & Units 09/27/2023    9:39 AM 09/20/2023    4:07 PM 06/21/2023    4:29 PM  CBC  WBC 4.0 - 10.5 K/uL 6.1  5.4   5.1   Hemoglobin 12.0 - 15.0 g/dL 16.1  09.6  04.5   Hematocrit 36.0 - 46.0 % 38.5  40.6  35.4   Platelets 150 - 400 K/uL 249  247  204        Latest Ref Rng & Units 09/27/2023    9:39 AM 09/20/2023    4:07 PM 06/21/2023    4:29 PM  CMP  Glucose 70 - 99 mg/dL 87  85  97   BUN 6 - 20 mg/dL 9  13  7    Creatinine 0.44 - 1.00 mg/dL 4.09  8.11  9.14   Sodium 135 - 145 mmol/L 140  137  139   Potassium 3.5 - 5.1 mmol/L 3.7  4.2  3.5   Chloride 98 - 111 mmol/L 105  103  105   CO2 22 - 32 mmol/L 30  29  26    Calcium 8.9 - 10.3 mg/dL 9.4  9.3  8.8   Total Protein 6.5 - 8.1 g/dL 6.7  6.8  7.0   Total Bilirubin 0.0 - 1.2 mg/dL 0.2  0.3  0.3   Alkaline Phos 38 - 126 U/L 87  89  74   AST 15 - 41 U/L 20  33  16   ALT 0 - 44 U/L 30  41  17    RADIOGRAPHIC STUDIES: No results found.  ASSESSMENT & PLAN [name] 48 y.o. female with medical history significant for iron deficiency anemia 2/2 to gastric bypass who presents for a follow up visit.   After review of the labs, review of the records, and discussion with the patient the patients findings are most consistent with iron deficiency anemia secondary to gastric bypass.  At this time would recommend repletion of iron stores with IV iron therapy.  P.o. therapy is generally adequate gastric bypass patients as they do not appropriately absorb p.o. iron therapy.  Additionally they did not  absorb iron from food as they should.  As such I would recommend we proceed with IV iron treatment.  Additionally we will check her for vitamin B12 deficiency which can also occur in patients with gastric bypass.  The patient and her husband voiced understanding of the plan moving forward.   # Iron Deficiency Anemia 2/2 to Gastric Bypass -- Findings are consistent with iron deficiency anemia secondary to a gastric bypass --continue to monitor iron levels by ordering iron panel and ferritin as well as reticulocytes, CBC, and CMP --no evidence of vitamin B12  deficiency, continue to monitor with vitamin B12 levels and methylmalonic acid. --P.o. iron therapy will likely be ineffective in a patient with gastric bypass.  We will need to proceed with IV iron therapy due to recurrent iron def anemia today.  --Labs today show white blood cell count 5.4, Hgb 13.3, MCV 97.6, Plt 247  --Ferritin 325, iron sat 34%, no need for  IV iron therapy at this time. --Plan for return to clinic in 6 months with interval 3 months with repeat labs. .  Orders Placed This Encounter  Procedures   TSH    Standing Status:   Future    Number of Occurrences:   1    Expiration Date:   09/26/2024   T4    Standing Status:   Future    Number of Occurrences:   1    Expiration Date:   09/26/2024   Hemoglobin A1c    Standing Status:   Future    Number of Occurrences:   1    Expected Date:   09/27/2023    Expiration Date:   09/26/2024   Phosphorus    Standing Status:   Future    Number of Occurrences:   1    Expiration Date:   09/26/2024   Vitamin D 25 hydroxy    Standing Status:   Future    Number of Occurrences:   1    Expected Date:   09/27/2023    Expiration Date:   09/26/2024   CBC with Differential (Cancer Center Only)    Standing Status:   Standing    Number of Occurrences:   4    Expiration Date:   09/26/2024   CMP (Cancer Center only)    Standing Status:   Standing    Number of Occurrences:   4    Expiration Date:   09/26/2024   Ferritin    Standing Status:   Standing    Number of Occurrences:   4    Expiration Date:   09/26/2024   Iron and Iron Binding Capacity ([facility] only)    Standing Status:   Standing    Number of Occurrences:   4    Expiration Date:   09/26/2024   Retic Panel    Standing Status:   Standing    Number of Occurrences:   4    Expiration Date:   09/26/2024    All questions were answered. The patient knows to call the clinic with any problems, questions or concerns.  A total of more than 30 minutes were spent on this encounter with  face-to-face time and non-face-to-face time, including preparing to see the patient, ordering tests and/or medications, counseling the patient and coordination of care as outlined above.   Ulysees Barns, MD Department of Hematology/Oncology [facility] Phone: [phone] Pager: [phone] Email: [email or web address]  09/27/2023 10:41 AM

## 2023-09-27 ENCOUNTER — Inpatient Hospital Stay (HOSPITAL_BASED_OUTPATIENT_CLINIC_OR_DEPARTMENT_OTHER): Payer: BC Managed Care – PPO | Admitting: Hematology and Oncology

## 2023-09-27 ENCOUNTER — Inpatient Hospital Stay: Payer: BC Managed Care – PPO

## 2023-09-27 VITALS — BP 112/77 | HR 116 | Temp 97.3°F | Resp 18 | Wt 151.4 lb

## 2023-09-27 DIAGNOSIS — R5383 Other fatigue: Secondary | ICD-10-CM

## 2023-09-27 DIAGNOSIS — D508 Other iron deficiency anemias: Secondary | ICD-10-CM

## 2023-09-27 LAB — CMP (CANCER CENTER ONLY)
ALT: 30 U/L (ref 0–44)
AST: 20 U/L (ref 15–41)
Albumin: 4 g/dL (ref 3.5–5.0)
Alkaline Phosphatase: 87 U/L (ref 38–126)
Anion gap: 5 (ref 5–15)
BUN: 9 mg/dL (ref 6–20)
CO2: 30 mmol/L (ref 22–32)
Calcium: 9.4 mg/dL (ref 8.9–10.3)
Chloride: 105 mmol/L (ref 98–111)
Creatinine: 0.98 mg/dL (ref 0.44–1.00)
GFR, Estimated: >60 mL/min (ref >60)
Glucose, Bld: 87 mg/dL (ref 70–99)
Potassium: 3.7 mmol/L (ref 3.5–5.1)
Sodium: 140 mmol/L (ref 135–145)
Total Bilirubin: 0.2 mg/dL (ref 0.0–1.2)
Total Protein: 6.7 g/dL (ref 6.5–8.1)

## 2023-09-27 LAB — CBC WITH DIFFERENTIAL (CANCER CENTER ONLY)
Abs Immature Granulocytes: 0.03 10*3/uL (ref 0.00–0.07)
Basophils Absolute: 0 10*3/uL (ref 0.0–0.1)
Basophils Relative: 1 %
Eosinophils Absolute: 0.3 10*3/uL (ref 0.0–0.5)
Eosinophils Relative: 5 %
HCT: 38.5 % (ref 36.0–46.0)
Hemoglobin: 12.7 g/dL (ref 12.0–15.0)
Immature Granulocytes: 1 %
Lymphocytes Relative: 33 %
Lymphs Abs: 2 10*3/uL (ref 0.7–4.0)
MCH: 32.6 pg (ref 26.0–34.0)
MCHC: 33 g/dL (ref 30.0–36.0)
MCV: 98.7 fL (ref 80.0–100.0)
Monocytes Absolute: 0.4 10*3/uL (ref 0.1–1.0)
Monocytes Relative: 7 %
Neutro Abs: 3.3 10*3/uL (ref 1.7–7.7)
Neutrophils Relative %: 53 %
Platelet Count: 249 10*3/uL (ref 150–400)
RBC: 3.9 MIL/uL (ref 3.87–5.11)
RDW: 14.5 % (ref 11.5–15.5)
WBC Count: 6.1 10*3/uL (ref 4.0–10.5)
nRBC: 0 % (ref 0.0–0.2)

## 2023-09-27 LAB — IRON AND IRON BINDING CAPACITY (CC-WL,HP ONLY)
Iron: 86 ug/dL (ref 28–170)
Saturation Ratios: 31 % (ref 10.4–31.8)
TIBC: 274 ug/dL (ref 250–450)
UIBC: 188 ug/dL (ref 148–442)

## 2023-09-27 LAB — FERRITIN: Ferritin: 377 ng/mL — ABNORMAL HIGH (ref 11–307)

## 2023-09-27 LAB — RETIC PANEL
Immature Retic Fract: 15.9 % (ref 2.3–15.9)
RBC.: 3.87 MIL/uL (ref 3.87–5.11)
Retic Count, Absolute: 80.9 10*3/uL (ref 19.0–186.0)
Retic Ct Pct: 2.1 % (ref 0.4–3.1)
Reticulocyte Hemoglobin: 37.3 pg (ref >27.9)

## 2023-09-27 LAB — HEMOGLOBIN A1C
Hgb A1c MFr Bld: 5 % (ref 4.8–5.6)
Mean Plasma Glucose: 96.8 mg/dL

## 2023-09-27 LAB — PHOSPHORUS: Phosphorus: 3.5 mg/dL (ref 2.5–4.6)

## 2023-09-27 LAB — VITAMIN D 25 HYDROXY (VIT D DEFICIENCY, FRACTURES): Vit D, 25-Hydroxy: 59.49 ng/mL (ref 30–100)

## 2023-09-27 LAB — TSH: TSH: 1.414 u[IU]/mL (ref 0.350–4.500)

## 2023-09-28 LAB — T4: T4, Total: 3.6 ug/dL — ABNORMAL LOW (ref 4.5–12.0)

## 2023-12-07 ENCOUNTER — Other Ambulatory Visit: Payer: Self-pay | Admitting: General Surgery

## 2023-12-07 DIAGNOSIS — R112 Nausea with vomiting, unspecified: Secondary | ICD-10-CM

## 2023-12-20 ENCOUNTER — Inpatient Hospital Stay: Payer: BC Managed Care – PPO | Attending: Hematology and Oncology

## 2023-12-20 DIAGNOSIS — D509 Iron deficiency anemia, unspecified: Secondary | ICD-10-CM | POA: Diagnosis present

## 2023-12-20 DIAGNOSIS — Z9884 Bariatric surgery status: Secondary | ICD-10-CM | POA: Insufficient documentation

## 2023-12-20 DIAGNOSIS — K912 Postsurgical malabsorption, not elsewhere classified: Secondary | ICD-10-CM | POA: Insufficient documentation

## 2023-12-20 DIAGNOSIS — D508 Other iron deficiency anemias: Secondary | ICD-10-CM

## 2023-12-20 LAB — IRON AND IRON BINDING CAPACITY (CC-WL,HP ONLY)
Iron: 82 ug/dL (ref 28–170)
Saturation Ratios: 29 % (ref 10.4–31.8)
TIBC: 284 ug/dL (ref 250–450)
UIBC: 202 ug/dL (ref 148–442)

## 2023-12-20 LAB — CMP (CANCER CENTER ONLY)
ALT: 21 U/L (ref 0–44)
AST: 21 U/L (ref 15–41)
Albumin: 4.2 g/dL (ref 3.5–5.0)
Alkaline Phosphatase: 97 U/L (ref 38–126)
Anion gap: 5 (ref 5–15)
BUN: 11 mg/dL (ref 6–20)
CO2: 30 mmol/L (ref 22–32)
Calcium: 8.9 mg/dL (ref 8.9–10.3)
Chloride: 104 mmol/L (ref 98–111)
Creatinine: 0.81 mg/dL (ref 0.44–1.00)
GFR, Estimated: >60 mL/min (ref >60)
Glucose, Bld: 82 mg/dL (ref 70–99)
Potassium: 4 mmol/L (ref 3.5–5.1)
Sodium: 139 mmol/L (ref 135–145)
Total Bilirubin: 0.2 mg/dL (ref 0.0–1.2)
Total Protein: 6.7 g/dL (ref 6.5–8.1)

## 2023-12-20 LAB — CBC WITH DIFFERENTIAL (CANCER CENTER ONLY)
Abs Immature Granulocytes: 0.02 10*3/uL (ref 0.00–0.07)
Basophils Absolute: 0.1 10*3/uL (ref 0.0–0.1)
Basophils Relative: 1 %
Eosinophils Absolute: 0.2 10*3/uL (ref 0.0–0.5)
Eosinophils Relative: 4 %
HCT: 38.1 % (ref 36.0–46.0)
Hemoglobin: 12.7 g/dL (ref 12.0–15.0)
Immature Granulocytes: 0 %
Lymphocytes Relative: 39 %
Lymphs Abs: 2.5 10*3/uL (ref 0.7–4.0)
MCH: 32.5 pg (ref 26.0–34.0)
MCHC: 33.3 g/dL (ref 30.0–36.0)
MCV: 97.4 fL (ref 80.0–100.0)
Monocytes Absolute: 0.6 10*3/uL (ref 0.1–1.0)
Monocytes Relative: 9 %
Neutro Abs: 3.1 10*3/uL (ref 1.7–7.7)
Neutrophils Relative %: 47 %
Platelet Count: 233 10*3/uL (ref 150–400)
RBC: 3.91 MIL/uL (ref 3.87–5.11)
RDW: 12.4 % (ref 11.5–15.5)
WBC Count: 6.5 10*3/uL (ref 4.0–10.5)
nRBC: 0 % (ref 0.0–0.2)

## 2023-12-20 LAB — RETIC PANEL
Immature Retic Fract: 8 % (ref 2.3–15.9)
RBC.: 3.97 MIL/uL (ref 3.87–5.11)
Retic Count, Absolute: 48 10*3/uL (ref 19.0–186.0)
Retic Ct Pct: 1.2 % (ref 0.4–3.1)
Reticulocyte Hemoglobin: 36.9 pg (ref >27.9)

## 2023-12-21 LAB — FERRITIN: Ferritin: 323 ng/mL — ABNORMAL HIGH (ref 11–307)

## 2024-01-30 ENCOUNTER — Other Ambulatory Visit: Payer: Self-pay | Admitting: *Deleted

## 2024-01-30 MED ORDER — DIVALPROEX SODIUM 250 MG PO DR TAB: 250.0000 mg | DELAYED_RELEASE_TABLET | Freq: Two times a day (BID) | ORAL | 0 refills | Status: DC

## 2024-02-13 ENCOUNTER — Telehealth: Payer: Self-pay | Admitting: Family Medicine

## 2024-02-13 NOTE — Telephone Encounter (Signed)
 ..  Pt understands that although there may be some limitations with this type of visit, we will take all precautions to reduce any security or privacy concerns.  Pt understands that this will be treated like an in office visit and we will file with pt's insurance, and there may be a patient responsible charge related to this service. ? ?
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
## 2024-02-13 NOTE — Patient Instructions (Signed)
 Below is our plan:  We will continue divalproex  250mg  and levetiracetam  1000mg  twice daily.   Please make sure you are consistent with timing of seizure medication. I recommend annual visit with primary care provider (PCP) for complete physical and routine blood work. I recommend daily intake of vitamin D  (400-800iu) and calcium  (800-1000mg ) for bone health. Discuss Dexa screening with PCP.   According to [place] law, you can not drive unless you are seizure / syncope free for at least 6 months and under physician's care.  Please maintain precautions. Do not participate in activities where a loss of awareness could harm you or someone else. No swimming alone, no tub bathing, no hot tubs, no driving, no operating motorized vehicles (cars, ATVs, motocycles, etc), lawnmowers, power tools or firearms. No standing at heights, such as rooftops, ladders or stairs. Avoid hot objects such as stoves, heaters, open fires. Wear a helmet when riding a bicycle, scooter, skateboard, etc. and avoid areas of traffic. Set your water heater to 120 degrees or less.  SUDEP is the sudden, unexpected death of someone with epilepsy, who was otherwise healthy. In SUDEP cases, no other cause of death is found when an autopsy is done. Each year, more than 1 in 1,000 people with epilepsy die from SUDEP. This is the leading cause of death in people with uncontrolled seizures. Until further answers are available, the best way to prevent SUDEP is to lower your risk by controlling seizures. Research has found that people with all types of epilepsy that experience convulsive seizures can be at risk.  Please make sure you are staying well hydrated. I recommend 50-60 ounces daily. Well balanced diet and regular exercise encouraged. Consistent sleep schedule with 6-8 hours recommended.   Please continue follow up with care team as directed.   Follow up with me in 1 year   You may receive a survey regarding today's visit. I encourage you  to leave honest feed back as I do use this information to improve patient care. Thank you for seeing me today!

## 2024-02-14 ENCOUNTER — Encounter: Payer: Self-pay | Admitting: Family Medicine

## 2024-02-14 ENCOUNTER — Telehealth: Payer: BC Managed Care – PPO | Admitting: Family Medicine

## 2024-02-14 DIAGNOSIS — G40909 Epilepsy, unspecified, not intractable, without status epilepticus: Secondary | ICD-10-CM

## 2024-02-14 MED ORDER — LEVETIRACETAM 1000 MG PO TABS: 1000.0000 mg | ORAL_TABLET | Freq: Two times a day (BID) | ORAL | 3 refills | Status: AC

## 2024-02-14 MED ORDER — DIVALPROEX SODIUM 250 MG PO DR TAB: 250.0000 mg | DELAYED_RELEASE_TABLET | Freq: Two times a day (BID) | ORAL | 3 refills | Status: DC

## 2024-03-20 ENCOUNTER — Inpatient Hospital Stay: Payer: BC Managed Care – PPO

## 2024-03-20 ENCOUNTER — Other Ambulatory Visit (INDEPENDENT_AMBULATORY_CARE_PROVIDER_SITE_OTHER): Payer: Self-pay

## 2024-03-20 ENCOUNTER — Inpatient Hospital Stay: Attending: Hematology and Oncology

## 2024-03-20 DIAGNOSIS — K912 Postsurgical malabsorption, not elsewhere classified: Secondary | ICD-10-CM | POA: Insufficient documentation

## 2024-03-20 DIAGNOSIS — Z9884 Bariatric surgery status: Secondary | ICD-10-CM | POA: Insufficient documentation

## 2024-03-20 DIAGNOSIS — D509 Iron deficiency anemia, unspecified: Secondary | ICD-10-CM | POA: Diagnosis present

## 2024-03-20 DIAGNOSIS — Z0289 Encounter for other administrative examinations: Secondary | ICD-10-CM

## 2024-03-20 DIAGNOSIS — G40909 Epilepsy, unspecified, not intractable, without status epilepticus: Secondary | ICD-10-CM

## 2024-03-20 DIAGNOSIS — D508 Other iron deficiency anemias: Secondary | ICD-10-CM

## 2024-03-20 LAB — IRON AND IRON BINDING CAPACITY (CC-WL,HP ONLY)
Iron: 58 ug/dL (ref 28–170)
Saturation Ratios: 25 % (ref 10.4–31.8)
TIBC: 234 ug/dL — ABNORMAL LOW (ref 250–450)
UIBC: 176 ug/dL (ref 148–442)

## 2024-03-20 LAB — CBC WITH DIFFERENTIAL (CANCER CENTER ONLY)
Abs Immature Granulocytes: 0.02 K/uL (ref 0.00–0.07)
Basophils Absolute: 0 K/uL (ref 0.0–0.1)
Basophils Relative: 1 %
Eosinophils Absolute: 0.1 K/uL (ref 0.0–0.5)
Eosinophils Relative: 3 %
HCT: 36.1 % (ref 36.0–46.0)
Hemoglobin: 12 g/dL (ref 12.0–15.0)
Immature Granulocytes: 0 %
Lymphocytes Relative: 35 %
Lymphs Abs: 1.9 K/uL (ref 0.7–4.0)
MCH: 32.8 pg (ref 26.0–34.0)
MCHC: 33.2 g/dL (ref 30.0–36.0)
MCV: 98.6 fL (ref 80.0–100.0)
Monocytes Absolute: 0.5 K/uL (ref 0.1–1.0)
Monocytes Relative: 9 %
Neutro Abs: 2.7 K/uL (ref 1.7–7.7)
Neutrophils Relative %: 52 %
Platelet Count: 216 K/uL (ref 150–400)
RBC: 3.66 MIL/uL — ABNORMAL LOW (ref 3.87–5.11)
RDW: 13.6 % (ref 11.5–15.5)
WBC Count: 5.2 K/uL (ref 4.0–10.5)
nRBC: 0 % (ref 0.0–0.2)

## 2024-03-20 LAB — CMP (CANCER CENTER ONLY)
ALT: 20 U/L (ref 0–44)
AST: 13 U/L — ABNORMAL LOW (ref 15–41)
Albumin: 3.9 g/dL (ref 3.5–5.0)
Alkaline Phosphatase: 81 U/L (ref 38–126)
Anion gap: 5 (ref 5–15)
BUN: 8 mg/dL (ref 6–20)
CO2: 30 mmol/L (ref 22–32)
Calcium: 8.9 mg/dL (ref 8.9–10.3)
Chloride: 104 mmol/L (ref 98–111)
Creatinine: 0.9 mg/dL (ref 0.44–1.00)
GFR, Estimated: >60 mL/min (ref >60)
Glucose, Bld: 70 mg/dL (ref 70–99)
Potassium: 4.2 mmol/L (ref 3.5–5.1)
Sodium: 139 mmol/L (ref 135–145)
Total Bilirubin: 0.3 mg/dL (ref 0.0–1.2)
Total Protein: 6.3 g/dL — ABNORMAL LOW (ref 6.5–8.1)

## 2024-03-20 LAB — RETIC PANEL
Immature Retic Fract: 4.5 % (ref 2.3–15.9)
RBC.: 3.67 MIL/uL — ABNORMAL LOW (ref 3.87–5.11)
Retic Count, Absolute: 59.1 K/uL (ref 19.0–186.0)
Retic Ct Pct: 1.6 % (ref 0.4–3.1)
Reticulocyte Hemoglobin: 35.1 pg (ref >27.9)

## 2024-03-20 LAB — FERRITIN: Ferritin: 397 ng/mL — ABNORMAL HIGH (ref 11–307)

## 2024-03-21 ENCOUNTER — Encounter: Payer: Self-pay | Admitting: Family Medicine

## 2024-03-21 ENCOUNTER — Ambulatory Visit: Payer: Self-pay | Admitting: Family Medicine

## 2024-03-21 LAB — VALPROIC ACID LEVEL: Valproic Acid Lvl: 18 ug/mL — ABNORMAL LOW (ref 50–100)

## 2024-03-21 MED ORDER — DIVALPROEX SODIUM 250 MG PO DR TAB: 250.0000 mg | DELAYED_RELEASE_TABLET | Freq: Three times a day (TID) | ORAL | 3 refills | Status: AC

## 2024-03-26 ENCOUNTER — Other Ambulatory Visit: Payer: Self-pay | Admitting: Obstetrics and Gynecology

## 2024-03-26 DIAGNOSIS — R928 Other abnormal and inconclusive findings on diagnostic imaging of breast: Secondary | ICD-10-CM

## 2024-03-27 ENCOUNTER — Inpatient Hospital Stay (HOSPITAL_BASED_OUTPATIENT_CLINIC_OR_DEPARTMENT_OTHER): Payer: BC Managed Care – PPO | Admitting: Hematology and Oncology

## 2024-03-27 VITALS — BP 122/80 | HR 98 | Temp 97.3°F | Resp 16 | Wt 160.8 lb

## 2024-03-27 DIAGNOSIS — D509 Iron deficiency anemia, unspecified: Secondary | ICD-10-CM | POA: Diagnosis not present

## 2024-03-27 DIAGNOSIS — R5383 Other fatigue: Secondary | ICD-10-CM

## 2024-03-27 DIAGNOSIS — D508 Other iron deficiency anemias: Secondary | ICD-10-CM

## 2024-03-27 NOTE — Progress Notes (Signed)
 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 Federico  05/09/2022: 1000 mg IV monoferric  administered 06/22/2022: WBC 4.6, Hgb 12.5, MCV 89.4, Plt 192 07/12/2023: 1000 mg IV monoferric  administered  Interval History:  [name] 48 y.o. female with medical history significant for iron deficiency anemia 2/2 to gastric bypass who presents for a follow up visit. The patient's last visit was on 09/27/2023. In the interim since the last visit she has had no major changes in her health.  On exam today [name] reports she has been feeling good overall and reports she is about 75%.  She reports she has her good days and bad days and her energy is not like it was before.  She does have some occasional lightheadedness and dizziness but no shortness of breath.  She denies any overt signs of bleeding but has been having some trouble with migraine.  She did recently have a urinary tract infection for which she was treated with nitrofurantoin.  She reports that she is eating well and doing her best to drink fluids including Gatorade.  She does however enjoy sodas and cranberry/grape drinks.  Otherwise she has been at her baseline level of health with no fevers, chills, sweats, nausea, vomiting or diarrhea.  A full 10 point ROS is otherwise negative.  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   Hyperlipidemia    Hypertension    resolved 5 yrs ago   Incontinence    Lumbago    Plantar fasciitis    Seizures (HCC)    seizure 11/23/15 and mild seizure 02/08/16   Sinus problem    Sore throat    Wears glasses    Wears glasses    reading glasses only    SURGICAL HISTORY: Past Surgical History:  Procedure Laterality Date   CARPAL TUNNEL RELEASE Right 02/02/2011   CARPAL TUNNEL RELEASE Left 01/22/2014   Procedure: LEFT CARPAL TUNNEL RELEASE;  Surgeon: Arley JONELLE Curia, MD;  Location: [facility];  Service: Orthopedics;  Laterality: Left;   CHOLECYSTECTOMY  08/06/2004   ESOPHAGOGASTRODUODENOSCOPY  03/14/2011   GASTRIC BYPASS  03/14/11   lap. Roux-en-Y   HYSTEROSCOPY WITH NOVASURE  10/15/2004   PLANTAR FASCIA RELEASE Left 08/25/2009   PLANTAR FASCIA RELEASE Right 01/02/2004   TRIGGER FINGER RELEASE Right 06/24/2014   Procedure: RELEASE A-1 PULLEY RIGHT RING FINGER;  Surgeon: Arley Curia, MD;  Location: [facility];  Service: Orthopedics;  Laterality: Right;  ANESTHESIA: IV REGIONAL FAB   TUBAL LIGATION  1999    SOCIAL HISTORY: Social History   Socioeconomic History   Marital status: Married    Spouse name: Redell   Number of children: 2   Years of education: 14   Highest  education level: Not on file  Occupational History   Not on file  Tobacco Use   Smoking status: Never   Smokeless tobacco: Never  Vaping Use   Vaping status: Never Used  Substance and Sexual Activity   Alcohol use: No   Drug use: No   Sexual activity: Never  Other Topics Concern   Not on file  Social History Narrative   Lives at home with husband   Caffeine use- sodas, 3 20 oz bottles daily   Social Drivers of Health   Financial Resource Strain: Not on file  Food Insecurity: Low Risk  (02/13/2023)   Received from [facility]   Hunger Vital Sign    Within the past 12 months, you  worried that your food would run out before you got money to buy more: Never true    Within the past 12 months, the food you bought just didn't last and you didn't have money to get more. : Never true  Transportation Needs: No Transportation Needs (02/13/2023)   Received from [facility]    In the past 12 months, has lack of reliable transportation kept you from medical appointments, meetings, work or from getting things needed for daily living? : No  Physical Activity: Not on file  Stress: Not on file  Social Connections: Unknown (01/11/2022)   Received from [facility]   Social Network    Social Network: Not on file  Intimate Partner Violence: Unknown (12/03/2021)   Received from [facility]   HITS    Physically Hurt: Not on file    Insult or Talk Down To: Not on file    Threaten Physical Harm: Not on file    Scream or Curse: Not on file    FAMILY HISTORY: Family History  Problem Relation Age of Onset   Gout Father    Cancer Maternal Grandmother        breast   Breast cancer Maternal Grandmother    Breast cancer Mother    Stroke Paternal Grandmother    Dementia Paternal Grandmother     ALLERGIES:  is allergic to cephalexin, cephalosporins, codeine, morphine  and codeine, other, pentazocine lactate, prednisone, rocephin  [ceftriaxone  sodium in dextrose ], tegretol [carbamazepine], and tramadol .  MEDICATIONS:  Current Outpatient Medications  Medication Sig Dispense Refill   acetaminophen  (TYLENOL ) 500 MG tablet Take 500-1,000 mg by mouth every 6 (six) hours as needed for moderate pain.     ALPRAZolam (XANAX) 0.25 MG tablet      diphenhydrAMINE (BENADRYL) 25 mg capsule Take 1 capsule by mouth daily. Alternates w/ Zyrtec     divalproex  (DEPAKOTE ) 250 MG DR tablet Take 1 tablet (250 mg total) by mouth 3 (three) times daily. 270 tablet 3   levETIRAcetam  (KEPPRA ) 1000 MG tablet Take 1 tablet (1,000 mg total) by mouth 2 (two) times daily. 180 tablet 3   zolpidem  (AMBIEN) 10 MG tablet Take 10 mg by mouth daily as needed for sleep.     No current facility-administered medications for this visit.    REVIEW OF SYSTEMS:   Constitutional: ( - ) fevers, ( - )  chills , ( - ) night sweats Eyes: ( - ) blurriness of vision, ( - ) double vision, ( - ) watery eyes Ears, nose, mouth, throat, and face: ( - ) mucositis, ( - ) sore throat Respiratory: ( - ) cough, ( - ) dyspnea, ( - ) wheezes Cardiovascular: ( - ) palpitation, ( - ) chest discomfort, ( - )  lower extremity swelling Gastrointestinal:  ( - ) nausea, ( - ) heartburn, ( - ) change in bowel habits Skin: ( - ) abnormal skin rashes Lymphatics: ( - ) new lymphadenopathy, ( - ) easy bruising Neurological: ( - ) numbness, ( - ) tingling, ( - ) new weaknesses Behavioral/Psych: ( - ) mood change, ( - ) new changes  All other systems were reviewed with the patient and are negative.  PHYSICAL EXAMINATION:  Vitals:   03/27/24 1534  BP: 122/80  Pulse: 98  Resp: 16  Temp: (!) 97.3 F (36.3 C)  SpO2: 99%       Filed Weights   03/27/24 1534  Weight: 160 lb 12.8 oz (72.9 kg)     GENERAL: Well-appearing middle-age Caucasian female, alert, no distress and comfortable SKIN: skin color, texture, turgor are normal, no rashes or significant lesions EYES: conjunctiva are pink and non-injected, sclera clear LUNGS: clear to auscultation and percussion with normal breathing effort HEART: regular rate & rhythm and no murmurs and no lower extremity edema Musculoskeletal: no cyanosis of digits and no clubbing  PSYCH: alert & oriented x 3, fluent speech NEURO: no focal motor/sensory deficits  LABORATORY DATA:  I have reviewed the data as listed    Latest Ref Rng & Units 03/20/2024    1:10 PM 12/20/2023    3:37 PM 09/27/2023    9:39 AM  CBC  WBC 4.0 - 10.5 K/uL 5.2  6.5  6.1   Hemoglobin 12.0 - 15.0 g/dL 87.9  87.2  87.2   Hematocrit 36.0 - 46.0 % 36.1  38.1  38.5   Platelets 150 - 400 K/uL 216  233   249        Latest Ref Rng & Units 03/20/2024    1:10 PM 12/20/2023    3:37 PM 09/27/2023    9:39 AM  CMP  Glucose 70 - 99 mg/dL 70  82  87   BUN 6 - 20 mg/dL 8  11  9    Creatinine 0.44 - 1.00 mg/dL 9.09  9.18  9.01   Sodium 135 - 145 mmol/L 139  139  140   Potassium 3.5 - 5.1 mmol/L 4.2  4.0  3.7   Chloride 98 - 111 mmol/L 104  104  105   CO2 22 - 32 mmol/L 30  30  30    Calcium  8.9 - 10.3 mg/dL 8.9  8.9  9.4   Total Protein 6.5 - 8.1 g/dL 6.3  6.7  6.7   Total Bilirubin 0.0 - 1.2 mg/dL 0.3  0.2  0.2   Alkaline Phos 38 - 126 U/L 81  97  87   AST 15 - 41 U/L 13  21  20    ALT 0 - 44 U/L 20  21  30     RADIOGRAPHIC STUDIES: No results found.  ASSESSMENT & PLAN [name] 48 y.o. female with medical history significant for iron deficiency anemia 2/2 to gastric bypass who presents for a follow up visit.   After review of the labs, review of the records, and discussion with the patient the patients findings are most consistent with iron deficiency anemia secondary to gastric bypass.  At this time would recommend repletion of iron stores with IV iron therapy.  P.o. therapy is generally adequate gastric bypass patients as they do not appropriately absorb p.o. iron therapy.  Additionally they did not absorb iron from food as they should.  As such I would recommend we proceed with IV  iron treatment.  Additionally we will check her for vitamin B12 deficiency which can also occur in patients with gastric bypass.  The patient and her husband voiced understanding of the plan moving forward.   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 --P.o. iron therapy will likely be ineffective in a patient  with gastric bypass.  We will need to proceed with IV iron therapy if her iron deficiency is persistent.  --Labs today show white blood cell count 5.2, hemoglobin 12.0, MCV 98.6, platelets 216 --Ferritin 397, iron sat 25%, no need for  IV iron therapy at this time. --Plan for return to clinic in 12 months with interval 6 months with repeat labs. .  No orders of the defined types were placed in this encounter.   All questions were answered. The patient knows to call the clinic with any problems, questions or concerns.  A total of more than 30 minutes were spent on this encounter with face-to-face time and non-face-to-face time, including preparing to see the patient, ordering tests and/or medications, counseling the patient and coordination of care as outlined above.   Elizabeth IVAR Kidney, MD Department of Hematology/Oncology [facility] Phone: [phone] Pager: [phone] Email: [email or web address]  04/02/2024 10:06 AM

## 2024-03-31 ENCOUNTER — Other Ambulatory Visit: Payer: Self-pay | Admitting: Medical Genetics

## 2024-04-05 ENCOUNTER — Encounter

## 2024-04-05 ENCOUNTER — Other Ambulatory Visit

## 2024-04-11 ENCOUNTER — Ambulatory Visit

## 2024-04-11 ENCOUNTER — Ambulatory Visit
Admission: RE | Admit: 2024-04-11 | Discharge: 2024-04-11 | Disposition: A | Discharge status: Home / Self Care | Source: Ambulatory Visit | Attending: Obstetrics and Gynecology | Admitting: Obstetrics and Gynecology

## 2024-04-11 DIAGNOSIS — R928 Other abnormal and inconclusive findings on diagnostic imaging of breast: Secondary | ICD-10-CM

## 2024-04-19 ENCOUNTER — Other Ambulatory Visit (HOSPITAL_COMMUNITY)
Admission: RE | Admit: 2024-04-19 | Discharge: 2024-04-19 | Disposition: A | Discharge status: Home / Self Care | Payer: Self-pay | Source: Ambulatory Visit | Attending: Medical Genetics | Admitting: Medical Genetics

## 2024-04-19 ENCOUNTER — Other Ambulatory Visit

## 2024-04-29 LAB — GENECONNECT MOLECULAR SCREEN: Genetic Analysis Overall Interpretation: NEGATIVE

## 2024-05-16 ENCOUNTER — Other Ambulatory Visit (HOSPITAL_COMMUNITY): Payer: Self-pay

## 2024-05-17 ENCOUNTER — Ambulatory Visit (INDEPENDENT_AMBULATORY_CARE_PROVIDER_SITE_OTHER)
Admission: RE | Admit: 2024-05-17 | Discharge: 2024-05-17 | Disposition: A | Discharge status: Home / Self Care | Source: Ambulatory Visit | Attending: Family Medicine | Admitting: Family Medicine

## 2024-05-17 ENCOUNTER — Other Ambulatory Visit (HOSPITAL_BASED_OUTPATIENT_CLINIC_OR_DEPARTMENT_OTHER): Payer: Self-pay | Admitting: Family Medicine

## 2024-05-17 DIAGNOSIS — M25572 Pain in left ankle and joints of left foot: Secondary | ICD-10-CM

## 2024-05-17 DIAGNOSIS — M25571 Pain in right ankle and joints of right foot: Secondary | ICD-10-CM

## 2024-05-17 DIAGNOSIS — M25551 Pain in right hip: Secondary | ICD-10-CM | POA: Diagnosis not present

## 2024-05-17 DIAGNOSIS — M25511 Pain in right shoulder: Secondary | ICD-10-CM | POA: Diagnosis not present

## 2024-05-17 DIAGNOSIS — M25562 Pain in left knee: Secondary | ICD-10-CM

## 2024-05-17 DIAGNOSIS — M549 Dorsalgia, unspecified: Secondary | ICD-10-CM | POA: Diagnosis not present

## 2024-05-17 DIAGNOSIS — M25552 Pain in left hip: Secondary | ICD-10-CM

## 2024-05-17 DIAGNOSIS — M25541 Pain in joints of right hand: Secondary | ICD-10-CM | POA: Diagnosis not present

## 2024-05-17 DIAGNOSIS — M25542 Pain in joints of left hand: Secondary | ICD-10-CM | POA: Diagnosis not present

## 2024-05-17 DIAGNOSIS — M25561 Pain in right knee: Secondary | ICD-10-CM

## 2024-05-17 DIAGNOSIS — M25512 Pain in left shoulder: Secondary | ICD-10-CM | POA: Diagnosis not present

## 2024-06-27 ENCOUNTER — Encounter (HOSPITAL_COMMUNITY): Payer: Self-pay | Admitting: General Surgery

## 2024-06-27 NOTE — Progress Notes (Signed)
 Attempted to obtain medical history for pre op call via telephone, unable to reach at this time. HIPAA compliant voicemail message left requesting return call to pre surgical testing department.

## 2024-07-03 NOTE — Anesthesia Preprocedure Evaluation (Addendum)
 Anesthesia Evaluation  Patient identified by MRN, date of birth, ID band Patient awake    Reviewed: Allergy & Precautions, H&P , NPO status , Patient's Chart, lab work & pertinent test results  Airway Mallampati: I  TM Distance: >3 FB Neck ROM: Full    Dental no notable dental hx. (+) Edentulous Upper, Edentulous Lower,    Pulmonary neg pulmonary ROS   Pulmonary exam normal breath sounds clear to auscultation       Cardiovascular Exercise Tolerance: Good hypertension, Pt. on medications Normal cardiovascular exam Rhythm:Regular Rate:Normal     Neuro/Psych  Headaches, Seizures -, Well Controlled,   negative psych ROS   GI/Hepatic Neg liver ROS,GERD  Medicated and Controlled,,  Endo/Other  negative endocrine ROS    Renal/GU negative Renal ROS  negative genitourinary   Musculoskeletal  (+) Arthritis ,    Abdominal   Peds negative pediatric ROS (+)  Hematology  (+) Blood dyscrasia, anemia   Anesthesia Other Findings   Reproductive/Obstetrics negative OB ROS                              Anesthesia Physical Anesthesia Plan  ASA: 3  Anesthesia Plan: MAC   Post-op Pain Management: Minimal or no pain anticipated   Induction: Intravenous  PONV Risk Score and Plan: 2 and Propofol  infusion  Airway Management Planned: Natural Airway and Simple Face Mask  Additional Equipment: None  Intra-op Plan:   Post-operative Plan: Extubation in OR  Informed Consent: I have reviewed the patients History and Physical, chart, labs and discussed the procedure including the risks, benefits and alternatives for the proposed anesthesia with the patient or authorized representative who has indicated his/her understanding and acceptance.     Dental advisory given  Plan Discussed with: CRNA, Anesthesiologist and Surgeon  Anesthesia Plan Comments: (Will not use Decadron  due to severe abnormal reaction  to similar in past.)         Anesthesia Quick Evaluation

## 2024-07-04 ENCOUNTER — Other Ambulatory Visit: Payer: Self-pay

## 2024-07-04 ENCOUNTER — Ambulatory Visit (HOSPITAL_COMMUNITY): Payer: Self-pay | Admitting: Anesthesiology

## 2024-07-04 ENCOUNTER — Encounter (HOSPITAL_COMMUNITY)
Admission: RE | Disposition: A | Discharge status: Home / Self Care | Payer: Self-pay | Source: Home / Self Care | Attending: General Surgery

## 2024-07-04 ENCOUNTER — Ambulatory Visit (HOSPITAL_COMMUNITY)
Admission: RE | Admit: 2024-07-04 | Discharge: 2024-07-04 | Disposition: A | Discharge status: Home / Self Care | Attending: General Surgery | Admitting: General Surgery

## 2024-07-04 ENCOUNTER — Encounter (HOSPITAL_COMMUNITY): Payer: Self-pay | Admitting: General Surgery

## 2024-07-04 DIAGNOSIS — K219 Gastro-esophageal reflux disease without esophagitis: Secondary | ICD-10-CM | POA: Insufficient documentation

## 2024-07-04 DIAGNOSIS — I10 Essential (primary) hypertension: Secondary | ICD-10-CM | POA: Insufficient documentation

## 2024-07-04 DIAGNOSIS — R569 Unspecified convulsions: Secondary | ICD-10-CM | POA: Diagnosis not present

## 2024-07-04 DIAGNOSIS — R1013 Epigastric pain: Secondary | ICD-10-CM | POA: Insufficient documentation

## 2024-07-04 DIAGNOSIS — Z6830 Body mass index (BMI) 30.0-30.9, adult: Secondary | ICD-10-CM | POA: Insufficient documentation

## 2024-07-04 DIAGNOSIS — R635 Abnormal weight gain: Secondary | ICD-10-CM | POA: Diagnosis not present

## 2024-07-04 DIAGNOSIS — K3189 Other diseases of stomach and duodenum: Secondary | ICD-10-CM | POA: Insufficient documentation

## 2024-07-04 DIAGNOSIS — R5383 Other fatigue: Secondary | ICD-10-CM | POA: Diagnosis not present

## 2024-07-04 DIAGNOSIS — Z9884 Bariatric surgery status: Secondary | ICD-10-CM | POA: Insufficient documentation

## 2024-07-04 DIAGNOSIS — R140 Abdominal distension (gaseous): Secondary | ICD-10-CM | POA: Diagnosis not present

## 2024-07-04 DIAGNOSIS — K5909 Other constipation: Secondary | ICD-10-CM | POA: Insufficient documentation

## 2024-07-04 DIAGNOSIS — R519 Headache, unspecified: Secondary | ICD-10-CM | POA: Insufficient documentation

## 2024-07-04 DIAGNOSIS — Z79899 Other long term (current) drug therapy: Secondary | ICD-10-CM | POA: Diagnosis not present

## 2024-07-04 HISTORY — PX: ESOPHAGOGASTRODUODENOSCOPY: SHX5428

## 2024-07-04 SURGERY — EGD (ESOPHAGOGASTRODUODENOSCOPY)
Anesthesia: Monitor Anesthesia Care

## 2024-07-04 MED ORDER — MEPERIDINE HCL 25 MG/ML IJ SOLN: 6.2500 mg | INTRAMUSCULAR | Status: DC | PRN

## 2024-07-04 MED ORDER — SUCRALFATE 1 G PO TABS: 1.0000 g | ORAL_TABLET | Freq: Three times a day (TID) | ORAL | Status: DC

## 2024-07-04 MED ORDER — PROPOFOL 1000 MG/100ML IV EMUL
INTRAVENOUS | Status: AC
Start: 2024-07-04 — End: 2024-07-04
  Filled 2024-07-04: qty 100

## 2024-07-04 MED ORDER — PROPOFOL 500 MG/50ML IV EMUL
INTRAVENOUS | Status: AC
  Filled 2024-07-04: qty 50

## 2024-07-04 MED ORDER — PROPOFOL 500 MG/50ML IV EMUL
INTRAVENOUS | Status: DC | PRN
  Administered 2024-07-04: 80 mg via INTRAVENOUS
  Administered 2024-07-04: 70 mg via INTRAVENOUS
  Administered 2024-07-04: 125 ug/kg/min via INTRAVENOUS

## 2024-07-04 MED ORDER — PROPOFOL 1000 MG/100ML IV EMUL
INTRAVENOUS | Status: AC
  Filled 2024-07-04: qty 100

## 2024-07-04 MED ORDER — OXYCODONE HCL 5 MG PO TABS: 5.0000 mg | ORAL_TABLET | Freq: Once | ORAL | Status: DC | PRN

## 2024-07-04 MED ORDER — GLYCOPYRROLATE PF 0.2 MG/ML IJ SOSY
PREFILLED_SYRINGE | INTRAMUSCULAR | Status: DC | PRN
  Administered 2024-07-04: .2 mg via INTRAVENOUS

## 2024-07-04 MED ORDER — SODIUM CHLORIDE 0.9 % IV SOLN
INTRAVENOUS | Status: AC | PRN
Start: 2024-07-04 — End: 2024-07-04
  Administered 2024-07-04: 500 mL via INTRAMUSCULAR

## 2024-07-04 MED ORDER — ONDANSETRON HCL 4 MG/2ML IJ SOLN: 4.0000 mg | Freq: Once | INTRAMUSCULAR | Status: DC | PRN

## 2024-07-04 MED ORDER — FENTANYL CITRATE (PF) 100 MCG/2ML IJ SOLN: 25.0000 ug | INTRAMUSCULAR | Status: DC | PRN

## 2024-07-04 MED ORDER — OXYCODONE HCL 5 MG/5ML PO SOLN: 5.0000 mg | Freq: Once | ORAL | Status: DC | PRN

## 2024-07-04 MED ORDER — PHENYLEPHRINE HCL (PRESSORS) 10 MG/ML IV SOLN
INTRAVENOUS | Status: AC
Start: 2024-07-04 — End: 2024-07-04
  Filled 2024-07-04: qty 1

## 2024-07-04 MED ORDER — PHENYLEPHRINE HCL (PRESSORS) 10 MG/ML IV SOLN
INTRAVENOUS | Status: AC
  Filled 2024-07-04: qty 1

## 2024-07-04 NOTE — Discharge Instructions (Addendum)
 YOU HAD AN ENDOSCOPIC PROCEDURE TODAY: Refer to the procedure report and other information in the discharge instructions given to you for any specific questions about what was found during the examination. If this information does not answer your questions, please call [facility] at [phone] to clarify.   YOU SHOULD EXPECT: Some feelings of bloating in the abdomen. Passage of more gas than usual. Walking can help get rid of the air that was put into your GI tract during the procedure and reduce the bloating. If you had a lower endoscopy (such as a colonoscopy or flexible sigmoidoscopy) you may notice spotting of blood in your stool or on the toilet paper. Some abdominal soreness may be present for a day or two, also.  DIET: Your first meal following the procedure should be a light meal and then it is ok to progress to your normal diet. A half-sandwich or bowl of soup is an example of a good first meal. Heavy or fried foods are harder to digest and may make you feel nauseous or bloated. Drink plenty of fluids but you should avoid alcoholic beverages for 24 hours.   ACTIVITY: Your care partner should take you home directly after the procedure. You should plan to take it easy, moving slowly for the rest of the day. You can resume normal activity the day after the procedure however YOU SHOULD NOT DRIVE, use power tools, machinery or perform tasks that involve climbing or major physical exertion for 24 hours (because of the sedation medicines used during the test).   SYMPTOMS TO REPORT IMMEDIATELY: A gastroenterologist can be reached at any hour. Please call [phone] for any of the following symptoms:   Following upper endoscopy (EGD, EUS, ERCP, esophageal dilation) Vomiting of blood or coffee ground material  New, significant abdominal pain  New, significant chest pain or pain under the shoulder blades  Painful or persistently difficult swallowing  New shortness of breath  Black,  tarry-looking or red, bloody stools  FOLLOW UP:  If any biopsies were taken you will be contacted by phone or by letter within the next 1-3 weeks. Call [phone]  if you have not heard about the biopsies in 3 weeks.  Please also call with any specific questions about appointments or follow up tests.

## 2024-07-04 NOTE — H&P (Signed)
 Chief Complaint  Patient presents with  Return Weight Loss   Subjective   [name] is a 48 y.o. female established patient in today for: History of Present Illness [name] is a 48 year old female with a history of gastric bypass surgery who presents with abdominal pain and weight gain.  She experiences significant abdominal pain described as a twisting sensation, primarily located in the abdomen, severe enough to wake her from sleep, and associated with bloating. She has gained weight, increasing from 147 to 170 pounds over the past few months, and is concerned about returning to a previous weight gain trajectory. Constipation has been present for the past two weeks, requiring the use of laxatives and enemas. She takes Senokot S with a stool softener and occasionally uses Miralax. Her current medications include Zyrtec, Keppra , Depakote , and Ambien, with Depakote  increased to three times a day in June due to low levels and a history of seizures. She also takes Tylenol  as needed for pain related to a recent stress fracture in her left foot.  There is no problem list on file for this patient.  Outpatient Medications Prior to Visit  Medication Sig Dispense Refill  AJOVY AUTOINJECTOR 225 mg/1.5 mL AtIn  ALPRAZolam (XANAX) 0.5 MG tablet Take 0.5 mg by mouth once daily as needed  biotin 1 mg Cap Take by mouth  BOTOX 100 unit injection  divalproex  (DEPAKOTE ) 250 MG DR tablet Take 250 mg by mouth 2 (two) times daily  fexofenadine (ALLEGRA) 60 MG tablet Take 60 mg by mouth 2 (two) times daily  levETIRAcetam  (KEPPRA ) 1000 MG tablet Take 1 tablet by mouth 2 (two) times daily  omeprazole (PRILOSEC OTC) 20 MG EC tablet Take 20 mg by mouth once daily  tiZANidine (ZANAFLEX) 4 MG tablet  zolpidem (AMBIEN) 10 mg tablet take 1 tablet by mouth every day at bedtime for 30 days   No facility-administered medications prior to visit.    Objective   Vitals:  06/06/24 1540  BP: 132/86  Pulse:  93  Temp: 36.7 C (98 F)  TempSrc: Temporal  SpO2: 99%  Weight: 75.9 kg (167 lb 6.4 oz)  Height: 160 cm (5' 3)  PainSc: 0-No pain   Body mass index is 29.65 kg/m. Physical Exam Constitutional:  Appearance: Normal appearance.  HENT:  Head: Normocephalic and atraumatic.  Pulmonary:  Effort: Pulmonary effort is normal.  Abdominal:  Comments: Nontender, no hernias  Musculoskeletal:  General: Normal range of motion.  Cervical back: Normal range of motion.  Neurological:  General: No focal deficit present.  Mental Status: She is alert and oriented to person, place, and time. Mental status is at baseline.  Psychiatric:  Mood and Affect: Mood normal.  Behavior: Behavior normal.  Thought Content: Thought content normal.       I reviewed CT scan showing normal positioning of pouch, remnant and J-J with contrast throughout the small intestine. She did not have any abdominal wall hernias.  Assessment/Plan:   Assessment & Plan Abdominal pain and bloating following bariatric surgery Persistent abdominal pain and bloating, severe enough to disrupt sleep. CT scan normal. Differential includes ulcer or hernia repair complications. - Order endoscopy to evaluate for ulcers or other abnormalities. - Continue Prilosec for acid reflux management.  Constipation following bariatric surgery Chronic constipation likely due to bariatric surgery, medications, and dietary factors. Recent Depakote  increase may contribute. - Recommend daily Miralax or Senokot for regular bowel regimen. - Advise bowel movements at least every three days.  Weight gain  following bariatric surgery Significant weight gain from 147 lbs to 170 lbs. Factors include dietary changes, medication side effects, and possible metabolic changes. - Refer to dietitian for nutritional counseling and alternative food options. - Encourage consistent eating patterns to stabilize metabolism.  Nutritional deficiency monitoring  post-gastric bypass Potential nutritional deficiencies monitored by hematologist. Protein intake maintained through bars and shakes. - Continue monitoring nutritional status with hematologist. - Ensure adequate protein intake through protein bars and shakes. Diagnoses and all orders for this visit:  S/P gastric bypass  Fatigue, unspecified type  Weight gain

## 2024-07-04 NOTE — Op Note (Signed)
 [facility] Patient Name: [name] Procedure Date: 07/04/2024 MRN: [id number] Attending MD: Herlene Beverley Bureau MD, MD,  Date of Birth: [birth year] CSN: [id number] Age: 48 Admit Type: Outpatient Procedure:                Upper GI endoscopy Indications:              Epigastric abdominal pain, Dyspepsia Providers:                Herlene Beverley Bureau MD, MD, Haskel Chris,                            Technician, Olam Riedel, RN Referring MD:              Medicines:                See the Anesthesia note for documentation of the                            administered medications Complications:            No immediate complications. Estimated Blood Loss:     Estimated blood loss: none. Procedure:                Pre-Anesthesia Assessment:                           - Prior to the procedure, a History and Physical                            was performed, and patient medications and                            allergies were reviewed. The patient is competent.                            The risks and benefits of the procedure and the                            sedation options and risks were discussed with the                            patient. All questions were answered and informed                            consent was obtained. Patient identification and                            proposed procedure were verified by the physician,                            the nurse and the anesthetist in the endoscopy                            suite. Mental Status Examination: alert and  oriented. Airway Examination: normal oropharyngeal                            airway and neck mobility. Respiratory Examination:                            clear to auscultation. CV Examination: normal.                            Prophylactic Antibiotics: The patient does not                            require prophylactic antibiotics. Prior                             Anticoagulants: The patient has taken no                            anticoagulant or antiplatelet agents. ASA Grade                            Assessment: III - A patient with severe systemic                            disease. After reviewing the risks and benefits,                            the patient was deemed in satisfactory condition to                            undergo the procedure. The anesthesia plan was to                            use moderate sedation / analgesia (conscious                            sedation). Immediately prior to administration of                            medications, the patient was re-assessed for                            adequacy to receive sedatives. The heart rate,                            respiratory rate, oxygen saturations, blood                            pressure, adequacy of pulmonary ventilation, and                            response to care were monitored throughout the  procedure. The physical status of the patient was                            re-assessed after the procedure.                           After obtaining informed consent, the endoscope was                            passed under direct vision. Throughout the                            procedure, the patient's blood pressure, pulse, and                            oxygen saturations were monitored continuously. The                            [id number]-[id number] ([id number] Olympus endoscope was introduced                            through the mouth, and advanced to the efferent                            jejunal loop. The upper GI endoscopy was                            accomplished without difficulty. The patient                            tolerated the procedure well. Scope In: Scope Out: Findings:      The examined esophagus was normal.      A few localized 3 mm erosions with no bleeding and no stigmata of recent       bleeding were found in the  cardia.      The examined jejunum was normal. Impression:               - Normal esophagus.                           - Erosive gastropathy with no bleeding and no                            stigmata of recent bleeding.                           - Normal examined jejunum.                           - No specimens collected. Moderate Sedation:      Moderate (conscious) sedation was administered by the nurse and       supervised by the endoscopist. The patient's oxygen saturation, heart       rate, blood pressure and response to care were monitored. Total       physician intraservice time was 8 minutes. Recommendation:           -  Discharge patient to home (ambulatory).                           - Use Prilosec (omeprazole) 20 mg PO BID for the                            rest of the patient's life.                           - Use sucralfate tablets 1 gram PO QID indefinitely. Procedure Code(s):        --- Professional ---                           [id number], Esophagogastroduodenoscopy, flexible,                            transoral; diagnostic, including collection of                            specimen(s) by brushing or washing, when performed                            (separate procedure) Diagnosis Code(s):        --- Professional ---                           R10.13, Epigastric pain                           K31.89, Other diseases of stomach and duodenum                           Z98.84, Bariatric surgery status CPT copyright 2022 [employer]. All rights reserved. The codes documented in this report are preliminary and upon coder review may  be revised to meet current compliance requirements.  Herlene Bureau, MD Herlene Righter Myisha Pickerel MD, MD 07/04/2024 7:46:37 AM This report has been signed electronically. Number of Addenda: 0

## 2024-07-04 NOTE — Transfer of Care (Signed)
 Immediate Anesthesia Transfer of Care Note  Patient: [name]  Procedure(s) Performed: EGD (ESOPHAGOGASTRODUODENOSCOPY)  Patient Location: Endoscopy Unit  Anesthesia Type:MAC  Level of Consciousness: awake, alert , oriented, and patient cooperative  Airway & Oxygen Therapy: Patient Spontanous Breathing  Post-op Assessment: Report given to RN and Post -op Vital signs reviewed and stable  Post vital signs: Reviewed and stable  Last Vitals:  Vitals Value Taken Time  BP 106/69 07/04/24 07:43  Temp    Pulse 77 07/04/24 07:44  Resp 16 07/04/24 07:44  SpO2 99 % 07/04/24 07:44  Vitals shown include unfiled device data.  Last Pain:  Vitals:   07/04/24 0743  TempSrc: Temporal  PainSc: 0-No pain         Complications: No notable events documented.

## 2024-07-04 NOTE — Anesthesia Postprocedure Evaluation (Signed)
 Anesthesia Post Note  Patient: [name]  Procedure(s) Performed: EGD (ESOPHAGOGASTRODUODENOSCOPY)     Patient location during evaluation: PACU Anesthesia Type: MAC Level of consciousness: awake and alert Pain management: pain level controlled Vital Signs Assessment: post-procedure vital signs reviewed and stable Respiratory status: spontaneous breathing, nonlabored ventilation, respiratory function stable and patient connected to nasal cannula oxygen Cardiovascular status: stable and blood pressure returned to baseline Postop Assessment: no apparent nausea or vomiting Anesthetic complications: no   No notable events documented.  Last Vitals:  Vitals:   07/04/24 0750 07/04/24 0800  BP: 108/74 105/68  Pulse: 74 78  Resp: 14 15  Temp:    SpO2: 96% 99%    Last Pain:  Vitals:   07/04/24 0800  TempSrc:   PainSc: 0-No pain                 Grover Woodfield

## 2024-07-16 ENCOUNTER — Other Ambulatory Visit: Payer: Self-pay | Admitting: Otolaryngology

## 2024-07-26 ENCOUNTER — Other Ambulatory Visit (HOSPITAL_BASED_OUTPATIENT_CLINIC_OR_DEPARTMENT_OTHER): Payer: Self-pay

## 2024-07-26 ENCOUNTER — Ambulatory Visit (HOSPITAL_BASED_OUTPATIENT_CLINIC_OR_DEPARTMENT_OTHER)
Admission: EM | Admit: 2024-07-26 | Discharge: 2024-07-26 | Disposition: A | Discharge status: Home / Self Care | Attending: Family Medicine | Admitting: Family Medicine

## 2024-07-26 ENCOUNTER — Encounter (HOSPITAL_BASED_OUTPATIENT_CLINIC_OR_DEPARTMENT_OTHER): Payer: Self-pay

## 2024-07-26 ENCOUNTER — Ambulatory Visit (HOSPITAL_BASED_OUTPATIENT_CLINIC_OR_DEPARTMENT_OTHER): Admit: 2024-07-26 | Discharge: 2024-07-26 | Disposition: A | Admitting: Radiology

## 2024-07-26 DIAGNOSIS — M25531 Pain in right wrist: Secondary | ICD-10-CM

## 2024-07-26 DIAGNOSIS — M25521 Pain in right elbow: Secondary | ICD-10-CM

## 2024-07-26 DIAGNOSIS — M79641 Pain in right hand: Secondary | ICD-10-CM

## 2024-07-26 DIAGNOSIS — W010XXA Fall on same level from slipping, tripping and stumbling without subsequent striking against object, initial encounter: Secondary | ICD-10-CM

## 2024-07-26 DIAGNOSIS — M79631 Pain in right forearm: Secondary | ICD-10-CM

## 2024-07-26 DIAGNOSIS — S60211A Contusion of right wrist, initial encounter: Secondary | ICD-10-CM

## 2024-07-26 MED ORDER — KETOROLAC TROMETHAMINE 30 MG/ML IJ SOLN
30.0000 mg | Freq: Once | INTRAMUSCULAR | Status: AC
  Administered 2024-07-26: 30 mg via INTRAMUSCULAR

## 2024-07-26 MED ORDER — KETOROLAC TROMETHAMINE 10 MG PO TABS
10.0000 mg | ORAL_TABLET | Freq: Four times a day (QID) | ORAL | 0 refills | Status: AC | PRN
  Filled 2024-07-26: qty 20, 5d supply, fill #0

## 2024-07-26 NOTE — ED Triage Notes (Signed)
 Pt states she was on a step ladder yesterday when she fell backwards off the bottom step. She landed on her right wrist/forearm. She is having some swelling and bruising from her wrist up to her elbow. She iced it over night and took tylenol  with no relief. She is unable to bend the wrist at this time. Reports a hx of osteopenia with hx of stress fractures.

## 2024-07-26 NOTE — Discharge Instructions (Addendum)
 Contusion of right wrist with right wrist, hand, forearm, elbow pain after a fall on 07/25/2024: X-rays are negative.  Use a wrist splint for support.  Ketorolac  30 mg injection now.  May use ketorolac  10 mg every 6 hours with food if needed for pain.  Encouraged elevation and ice.  Follow-up if symptoms do not improve, worsen or new symptoms occur.  See below for signs and symptoms of worsening condition and reasons to go to an emergency room or return here.  Contact a health care provider if: Your symptoms do not improve after several days of treatment. Your symptoms get worse. You have difficulty moving the injured area. Get help right away if: You have severe pain. You have numbness in a hand or foot. Your hand or foot turns pale or cold.

## 2024-07-26 NOTE — ED Provider Notes (Signed)
 [facility]    CSN: [id number] Arrival date & time: 07/26/24  0911      History   Chief Complaint Chief Complaint  Patient presents with   Wrist Pain    HPI [name] is a 48 y.o. female.   48 year old female who was on a stepladder on 07/25/2024 and fell backwards off the bottom step.  Her right arm went out to stop the fall and she hit her wrist and forearm.  She is having pain and swelling at the wrist the forearm and the elbow.  She has done ice and elevation and also took Tylenol  but had little relief of her pain.  She is not able to bend the wrist.  She has a history of osteopenia with a previous stress fracture.   Wrist Pain Pertinent negatives include no chest pain and no abdominal pain.    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   Hyperlipidemia    Hypertension    resolved 5 yrs ago   Incontinence    Lumbago    Plantar fasciitis    Seizures (HCC)    seizure 11/23/15 and mild seizure 02/08/16   Sinus problem    Sore throat    Wears glasses    Wears glasses    reading glasses only    Patient Active Problem List   Diagnosis Date Noted   Iron deficiency anemia 04/27/2022   Seizure disorder (HCC) 02/11/2019   Hypertension 07/01/2011   Hyperlipidemia 07/01/2011   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 Roux-en-Y   HYSTEROSCOPY WITH NOVASURE  10/15/2004   PLANTAR FASCIA RELEASE Left 08/25/2009   PLANTAR FASCIA RELEASE Right 01/02/2004   TRIGGER FINGER RELEASE Right 06/24/2014   Procedure: RELEASE A-1 PULLEY RIGHT RING FINGER;  Surgeon: Arley Curia, MD;  Location: [facility];  Service: Orthopedics;  Laterality: Right;  ANESTHESIA: IV REGIONAL FAB   TUBAL LIGATION  1999    OB History   No obstetric history on file.      Home Medications    Prior to Admission medications   Medication Sig Start Date End Date Taking? Authorizing Provider  ketorolac  (TORADOL ) 10 MG tablet Take 1 tablet (10 mg total) by mouth every 6 (six) hours as needed (Take with food.). 07/26/24  Yes Ival Domino, FNP  acetaminophen  (TYLENOL ) 500 MG tablet Take 500-1,000 mg by mouth every 6 (six) hours as needed for moderate pain.    [provider]  ALPRAZolam SHEFFIELD) 0.25 MG tablet  02/26/20   [provider]  botulinum toxin Type A (BOTOX) 100 units SOLR injection Inject 100 Units into the muscle once.  [provider]  diphenhydrAMINE (BENADRYL) 25 mg capsule Take 1 capsule by mouth daily. Alternates w/ Zyrtec    [provider]  divalproex  (DEPAKOTE ) 250 MG DR tablet Take 1 tablet (250 mg total) by mouth 3 (three) times daily. 03/21/24   Lomax, Amy, NP  Fremanezumab-vfrm (AJOVY) 225 MG/1.5ML SOAJ Inject 225 1e11 Vector Genomes/mL into the skin once. Once a month for migraine    [provider]  levETIRAcetam  (KEPPRA ) 1000 MG tablet Take 1 tablet (1,000 mg total) by mouth 2 (two) times daily. 02/14/24   Lomax, Amy, NP  methotrexate (RHEUMATREX) 2.5 MG tablet Take 2.5 mg by mouth once a week. Caution:Chemotherapy. Protect from light.    [provider]  predniSONE (DELTASONE) 5 MG tablet Take 5  mg by mouth daily with breakfast.    [provider]  tiZANidine (ZANAFLEX) 4 MG capsule Take 4 mg by mouth 3 (three) times daily.    [provider]  zolpidem (AMBIEN) 10 MG tablet Take 10 mg by mouth daily as needed for sleep.    [provider]  nebivolol (BYSTOLIC) 5 MG tablet Take 5 mg by mouth daily.    11/21/11  [provider]  Niacin-Simvastatin (SIMCOR) 500-40 MG TB24 Take by mouth 1 day or 1 dose.    11/21/11  [provider]  omeprazole (PRILOSEC) 40 MG capsule Take 40 mg by mouth daily.    11/21/11  [provider]    Family History Family History  Problem Relation Age of Onset   Gout Father    Cancer Maternal Grandmother        breast   Breast cancer Maternal Grandmother    Breast cancer Mother    Stroke Paternal Grandmother    Dementia Paternal Grandmother     Social History Social History   Tobacco Use   Smoking status: Never   Smokeless tobacco: Never  Vaping Use   Vaping status: Never Used  Substance Use Topics   Alcohol use: No   Drug use: No     Allergies   Amitriptyline , Cephalexin, Cephalosporins, Codeine, Morphine  and codeine, Other, Pentazocine lactate, Rocephin  [ceftriaxone  sodium in dextrose ], Tegretol [carbamazepine], and Tramadol    Review of Systems Review of Systems  Constitutional:  Negative for fever.  Respiratory:  Negative for cough.   Cardiovascular:  Negative for chest pain.  Gastrointestinal:  Negative for abdominal pain, constipation, diarrhea, nausea and vomiting.  Musculoskeletal:  Positive for joint swelling (Right wrist, forearm and elbow). Negative for arthralgias and back pain.  Skin:  Negative for color change and rash.  Neurological:  Negative for syncope.  All other systems reviewed and are negative.    Physical Exam Triage Vital Signs ED Triage Vitals  Encounter Vitals Group     BP 07/26/24 1102 112/76     Girls Systolic BP Percentile --      Girls Diastolic BP  Percentile --      Boys Systolic BP Percentile --      Boys Diastolic BP Percentile --      Pulse Rate 07/26/24 1102 89     Resp 07/26/24 1102 20     Temp 07/26/24 1102 99.1 F (37.3 C)     Temp Source 07/26/24 1102 Oral     SpO2 07/26/24 1102 96 %     Weight --      Height --      Head Circumference --      Peak Flow --  Pain Score 07/26/24 1057 7     Pain Loc --      Pain Education --      Exclude from Growth Chart --    No data found.  Updated Vital Signs BP 112/76 (BP Location: Left Arm)   Pulse 89   Temp 99.1 F (37.3 C) (Oral)   Resp 20   SpO2 96%   Visual Acuity Right Eye Distance:   Left Eye Distance:   Bilateral Distance:    Right Eye Near:   Left Eye Near:    Bilateral Near:     Physical Exam Vitals and nursing note reviewed.  Constitutional:      General: She is not in acute distress.    Appearance: She is well-developed. She is not ill-appearing or toxic-appearing.  HENT:     Head: Normocephalic and atraumatic.     Right Ear: External ear normal.     Left Ear: External ear normal.     Nose: Nose normal.     Mouth/Throat:     Lips: Pink.     Mouth: Mucous membranes are moist.  Eyes:     Conjunctiva/sclera: Conjunctivae normal.     Pupils: Pupils are equal, round, and reactive to light.  Cardiovascular:     Rate and Rhythm: Normal rate and regular rhythm.     Heart sounds: S1 normal and S2 normal. No murmur heard. Pulmonary:     Effort: Pulmonary effort is normal. No respiratory distress.     Breath sounds: Normal breath sounds. No decreased breath sounds, wheezing, rhonchi or rales.  Musculoskeletal:        General: No swelling.     Right shoulder: Normal.     Left shoulder: Normal.     Right upper arm: Normal.     Left upper arm: Normal.     Right elbow: No swelling, deformity, effusion or lacerations. Normal range of motion. Tenderness (At the elbow bilaterally) present.     Left elbow: Normal.     Right forearm: Swelling (Mid  forearm and towards the right), tenderness (Mostly around the wrist but some at the elbow) and bony tenderness (Mostly around the wrist but some of the elbow) present. No edema, deformity or lacerations.     Left forearm: Normal.     Right wrist: Swelling, tenderness and bony tenderness present. No deformity, effusion, lacerations, snuff box tenderness or crepitus. Decreased range of motion (Due to pain). Normal pulse.     Left wrist: Normal.     Right hand: Tenderness (Near the wrist) and bony tenderness (Near the wrist) present. No swelling, deformity or lacerations. Normal range of motion. Normal strength. Normal sensation. There is no disruption of two-point discrimination. Normal capillary refill. Normal pulse.     Left hand: Normal.  Skin:    General: Skin is warm and dry.     Capillary Refill: Capillary refill takes less than 2 seconds.     Findings: No rash.  Neurological:     Mental Status: She is alert and oriented to person, place, and time.  Psychiatric:        Mood and Affect: Mood normal.      UC Treatments / Results  Labs (all labs ordered are listed, but only abnormal results are displayed)  Contains abnormal data CMP (Cancer Center only): 03/20/2024:    Component Ref Range & Units (hover) 4 mo ago (03/20/24)  Sodium 139  Potassium 4.2  Chloride 104  CO2 30  Glucose, Bld 70  Comment: Glucose reference range applies only to samples taken after fasting for at least 8 hours.  BUN 8  Creatinine 0.90  Calcium  8.9  Total Protein 6.3 Low   Albumin 3.9  AST 13 Low   ALT 20  Alkaline Phosphatase 81  Total Bilirubin 0.3  GFR, Estimated >60  Comment: (NOTE) Calculated using the CKD-EPI Creatinine Equation (2021)  Anion gap 5  Comment: Performed at [facility], [address] [place], [place] [address]  Resulting Agency [facility]      EKG   Radiology DG Hand Complete Right Result Date: 07/26/2024 : d CLINICAL DATA:  pain EXAM:  RIGHT HAND - COMPLETE 3+ VIEW COMPARISON:  None Available. FINDINGS: No acute fracture or dislocation. There is no evidence of arthropathy or other focal bone abnormality. Soft tissues are unremarkable. No radiopaque foreign body. IMPRESSION: No acute fracture or dislocation. Electronically Signed   By: Rogelia Myers M.D.   On: 07/26/2024 11:50   DG Forearm Right Result Date: 07/26/2024 CLINICAL DATA:  pain EXAM: RIGHT FOREARM - 2 VIEW COMPARISON:  None Available. FINDINGS: No acute fracture or dislocation. Apparent lucency within the distal radial metaphysis, without overlying cortical disruption, likely a nutrient foramen. There is no evidence of arthropathy or other focal bone abnormality. Soft tissues are unremarkable. IMPRESSION: No acute fracture or malalignment of the radius and ulna. Electronically Signed   By: Rogelia Myers M.D.   On: 07/26/2024 11:47    Procedures Procedures (including critical care time)  Medications Ordered in UC Medications  ketorolac  (TORADOL ) 30 MG/ML injection 30 mg (30 mg Intramuscular Given 07/26/24 1222)    Initial Impression / Assessment and Plan / UC Course  I have reviewed the triage vital signs and the nursing notes.  Pertinent labs & imaging results that were available during my care of the patient were reviewed by me and considered in my medical decision making (see chart for details).  Plan of Care: Contusion of right wrist with right wrist, hand, forearm, elbow pain after a fall on 07/25/2024: X-rays are negative.  Use a wrist splint for support.  Ketorolac  30 mg injection now.  May use ketorolac  10 mg every 6 hours with food if needed for pain.  Encouraged elevation and ice.  Follow-up if symptoms do not improve, worsen or new symptoms occur.  See below for signs and symptoms of worsening condition and reasons to go to an emergency room or return here.   I reviewed the plan of care with the patient and/or the patient's guardian.  The patient  and/or guardian had time to ask questions and acknowledged that the questions were answered.  I provided instruction on symptoms or reasons to return here or to go to an ER, if symptoms/condition did not improve, worsened or if new symptoms occurred.  Final Clinical Impressions(s) / UC Diagnoses   Final diagnoses:  Right hand pain  Right wrist pain  Right forearm pain  Right elbow pain  Contusion of right wrist, initial encounter  Fall on same level from slipping, tripping or stumbling, initial encounter     Discharge Instructions      Contusion of right wrist with right wrist, hand, forearm, elbow pain after a fall on 07/25/2024: X-rays are negative.  Use a wrist splint for support.  Ketorolac  30 mg injection now.  May use ketorolac  10 mg every 6 hours with food if needed for pain.  Encouraged elevation and ice.  Follow-up if symptoms do not  improve, worsen or new symptoms occur.  See below for signs and symptoms of worsening condition and reasons to go to an emergency room or return here.  Contact a health care provider if: Your symptoms do not improve after several days of treatment. Your symptoms get worse. You have difficulty moving the injured area. Get help right away if: You have severe pain. You have numbness in a hand or foot. Your hand or foot turns pale or cold.     ED Prescriptions     Medication Sig Dispense Auth. Provider   ketorolac  (TORADOL ) 10 MG tablet Take 1 tablet (10 mg total) by mouth every 6 (six) hours as needed (Take with food.). 20 tablet Aslyn Cottman, FNP      PDMP not reviewed this encounter.   Ival Domino, FNP 07/26/24 1224

## 2024-07-31 ENCOUNTER — Encounter: Admitting: Skilled Nursing Facility1

## 2024-09-02 ENCOUNTER — Encounter: Admitting: Skilled Nursing Facility1

## 2024-09-12 ENCOUNTER — Ambulatory Visit (HOSPITAL_BASED_OUTPATIENT_CLINIC_OR_DEPARTMENT_OTHER)

## 2024-09-27 ENCOUNTER — Other Ambulatory Visit: Payer: Self-pay | Admitting: *Deleted

## 2024-09-27 ENCOUNTER — Inpatient Hospital Stay: Attending: Hematology and Oncology

## 2024-09-27 DIAGNOSIS — Z9884 Bariatric surgery status: Secondary | ICD-10-CM | POA: Diagnosis not present

## 2024-09-27 DIAGNOSIS — D508 Other iron deficiency anemias: Secondary | ICD-10-CM

## 2024-09-27 LAB — CBC WITH DIFFERENTIAL (CANCER CENTER ONLY)
Abs Immature Granulocytes: 0.03 10*3/uL (ref 0.00–0.07)
Basophils Absolute: 0 10*3/uL (ref 0.0–0.1)
Basophils Relative: 1 %
Eosinophils Absolute: 0.1 10*3/uL (ref 0.0–0.5)
Eosinophils Relative: 2 %
HCT: 36.9 % (ref 36.0–46.0)
Hemoglobin: 12 g/dL (ref 12.0–15.0)
Immature Granulocytes: 1 %
Lymphocytes Relative: 29 %
Lymphs Abs: 1.7 10*3/uL (ref 0.7–4.0)
MCH: 31.6 pg (ref 26.0–34.0)
MCHC: 32.5 g/dL (ref 30.0–36.0)
MCV: 97.1 fL (ref 80.0–100.0)
Monocytes Absolute: 0.5 10*3/uL (ref 0.1–1.0)
Monocytes Relative: 8 %
Neutro Abs: 3.5 10*3/uL (ref 1.7–7.7)
Neutrophils Relative %: 59 %
Platelet Count: 236 10*3/uL (ref 150–400)
RBC: 3.8 MIL/uL — ABNORMAL LOW (ref 3.87–5.11)
RDW: 13.3 % (ref 11.5–15.5)
WBC Count: 5.9 10*3/uL (ref 4.0–10.5)
nRBC: 0 % (ref 0.0–0.2)

## 2024-09-27 LAB — RETIC PANEL
Immature Retic Fract: 9.3 % (ref 2.3–15.9)
RBC.: 3.82 MIL/uL — ABNORMAL LOW (ref 3.87–5.11)
Retic Count, Absolute: 57.7 10*3/uL (ref 19.0–186.0)
Retic Ct Pct: 1.5 % (ref 0.4–3.1)
Reticulocyte Hemoglobin: 36.1 pg

## 2024-09-27 LAB — CMP (CANCER CENTER ONLY)
ALT: 13 U/L (ref 0–44)
AST: 16 U/L (ref 15–41)
Albumin: 4.2 g/dL (ref 3.5–5.0)
Alkaline Phosphatase: 105 U/L (ref 38–126)
Anion gap: 12 (ref 5–15)
BUN: 7 mg/dL (ref 6–20)
CO2: 26 mmol/L (ref 22–32)
Calcium: 8.8 mg/dL — ABNORMAL LOW (ref 8.9–10.3)
Chloride: 107 mmol/L (ref 98–111)
Creatinine: 0.81 mg/dL (ref 0.44–1.00)
GFR, Estimated: >60 mL/min
Glucose, Bld: 94 mg/dL (ref 70–99)
Potassium: 3.8 mmol/L (ref 3.5–5.1)
Sodium: 144 mmol/L (ref 135–145)
Total Bilirubin: 0.2 mg/dL (ref 0.0–1.2)
Total Protein: 6.7 g/dL (ref 6.5–8.1)

## 2024-09-27 LAB — IRON AND IRON BINDING CAPACITY (CC-WL,HP ONLY)
Iron: 52 ug/dL (ref 28–170)
Saturation Ratios: 17 % (ref 10.4–31.8)
TIBC: 302 ug/dL (ref 250–450)
UIBC: 251 ug/dL

## 2024-09-27 LAB — FERRITIN: Ferritin: 220 ng/mL (ref 11–307)

## 2025-02-20 ENCOUNTER — Ambulatory Visit: Admitting: Family Medicine

## 2025-02-25 ENCOUNTER — Ambulatory Visit: Admitting: Family Medicine

## 2025-03-21 ENCOUNTER — Inpatient Hospital Stay

## 2025-03-28 ENCOUNTER — Inpatient Hospital Stay: Admitting: Hematology and Oncology
